# Patient Record
Sex: Female | Born: 2000 | Race: Black or African American | Hispanic: No | Marital: Single | Smoking: Never smoker
Health system: Southern US, Community
[De-identification: ages and names within clinical notes are randomized; demographics above are authoritative.]

## PROBLEM LIST (undated history)

## (undated) DIAGNOSIS — M329 Systemic lupus erythematosus, unspecified: Secondary | ICD-10-CM

## (undated) DIAGNOSIS — R7303 Prediabetes: Secondary | ICD-10-CM

## (undated) DIAGNOSIS — D649 Anemia, unspecified: Secondary | ICD-10-CM

## (undated) DIAGNOSIS — IMO0002 Reserved for concepts with insufficient information to code with codable children: Secondary | ICD-10-CM

## (undated) HISTORY — DX: Prediabetes: R73.03

## (undated) HISTORY — DX: Anemia, unspecified: D64.9

---
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
## 2018-01-17 ENCOUNTER — Encounter (HOSPITAL_COMMUNITY): Payer: Self-pay | Admitting: Emergency Medicine

## 2018-01-17 ENCOUNTER — Other Ambulatory Visit: Payer: Self-pay

## 2018-01-17 DIAGNOSIS — Z7982 Long term (current) use of aspirin: Secondary | ICD-10-CM | POA: Insufficient documentation

## 2018-01-17 DIAGNOSIS — L989 Disorder of the skin and subcutaneous tissue, unspecified: Secondary | ICD-10-CM | POA: Diagnosis not present

## 2018-01-17 DIAGNOSIS — Z79899 Other long term (current) drug therapy: Secondary | ICD-10-CM | POA: Diagnosis not present

## 2018-01-17 DIAGNOSIS — R102 Pelvic and perineal pain: Secondary | ICD-10-CM | POA: Insufficient documentation

## 2018-01-17 DIAGNOSIS — L930 Discoid lupus erythematosus: Secondary | ICD-10-CM | POA: Diagnosis not present

## 2018-01-17 DIAGNOSIS — R600 Localized edema: Secondary | ICD-10-CM | POA: Diagnosis present

## 2018-01-17 DIAGNOSIS — R238 Other skin changes: Secondary | ICD-10-CM | POA: Diagnosis not present

## 2018-01-17 DIAGNOSIS — M3210 Systemic lupus erythematosus, organ or system involvement unspecified: Secondary | ICD-10-CM | POA: Diagnosis not present

## 2018-01-17 NOTE — ED Triage Notes (Signed)
Pt arriving with swelling in her hands and vagial burning/itching. Swelling is associated with Lupus. Vaginal issues have been present x1 week

## 2018-01-18 ENCOUNTER — Emergency Department (HOSPITAL_COMMUNITY)
Admission: EM | Admit: 2018-01-18 | Discharge: 2018-01-18 | Disposition: A | Discharge status: Home / Self Care | Payer: Medicaid Other | Attending: Emergency Medicine | Admitting: Emergency Medicine

## 2018-01-18 DIAGNOSIS — R238 Other skin changes: Secondary | ICD-10-CM

## 2018-01-18 DIAGNOSIS — M329 Systemic lupus erythematosus, unspecified: Secondary | ICD-10-CM

## 2018-01-18 LAB — WET PREP, GENITAL
CLUE CELLS WET PREP: NONE SEEN
Sperm: NONE SEEN
TRICH WET PREP: NONE SEEN
YEAST WET PREP: NONE SEEN

## 2018-01-18 MED ORDER — MYCOPHENOLATE MOFETIL 500 MG PO TABS: 1000.0000 mg | ORAL_TABLET | Freq: Two times a day (BID) | ORAL | 0 refills | Status: DC

## 2018-01-18 MED ORDER — PREDNISONE 10 MG PO TABS: ORAL_TABLET | ORAL | 0 refills | Status: DC

## 2018-01-18 MED ORDER — PREDNISONE 20 MG PO TABS
60.0000 mg | ORAL_TABLET | Freq: Once | ORAL | Status: AC
  Administered 2018-01-18: 60 mg via ORAL
  Filled 2018-01-18: qty 3

## 2018-01-18 MED ORDER — HYDROXYCHLOROQUINE SULFATE 200 MG PO TABS: 200.0000 mg | ORAL_TABLET | Freq: Two times a day (BID) | ORAL | 0 refills | Status: AC

## 2018-01-18 NOTE — ED Provider Notes (Signed)
[place] DEPT Provider Note   CSN: [id number] Arrival date & time: 01/17/18  2231     History   Chief Complaint Chief Complaint  Patient presents with  . Joint Swelling    hands  . Vaginal Itching    HPI Rhonda Franco is a 17 y.o. female.  Patient with history of lupus, off medications x 2 months, presents with complaint of hand swelling for the past week. She states this is familiar to her as lupus related. She moved to the area recently and has not established with medicaid as of yet and has been out of her medications for 2 months, including cellcept, plaquenil, prednisone, lasix. No fever. No other joint or soft tissue swelling. No systemic symptoms of cough, chest pain, SOB, nausea, vomiting or abdominal pain. She does complain of burning sensation around the vaginal area. It is worse after she washes and eases over time afterward. She has not seen any rash. No vulvar or vaginal symptoms of discharge, bleeding or pelvic pain.   The history is provided by the patient. No language interpreter was used.  Vaginal Itching     Past Medical History:  Diagnosis Date  . Lupus [facility])     Patient Active Problem List   Diagnosis Date Noted  . Pericardial effusion 03/05/2017    History reviewed. No pertinent surgical history.   OB History   None      Home Medications    Prior to Admission medications   Medication Sig Start Date End Date Taking? Authorizing Provider  aspirin EC 81 MG tablet Take 81 mg by mouth daily.   Yes [provider]  colchicine 0.6 MG tablet Take 0.6 mg by mouth 2 (two) times daily. 05/11/17  Yes [provider]  folic acid (FOLVITE) 1 MG tablet Take 1 mg by mouth daily. 05/25/17  Yes [provider]  furosemide (LASIX) 20 MG tablet Take 20 mg by mouth 2 (two) times daily. 06/04/17  Yes [provider]  hydroxychloroquine (PLAQUENIL) 200 MG tablet Take 200 mg by mouth 2 (two)  times daily. 05/25/17  Yes [provider]  mycophenolate (CELLCEPT) 500 MG tablet Take 1,000 mg by mouth 2 (two) times daily. 12/01/17  Yes [provider]  predniSONE (DELTASONE) 5 MG tablet Take 5 mg by mouth every morning. 11/01/17  Yes [provider]  doxycycline (VIBRAMYCIN) 100 MG capsule Take 1 capsule (100 mg total) by mouth 2 (two) times daily. Patient not taking: Reported on 01/18/2018 08/28/17   Lajean Saver, MD    Family History Family History  Problem Relation Age of Onset  . Diabetes Mother   . Arthritis Mother   . Diabetes Maternal Aunt   . Diabetes Maternal Grandfather   . Heart disease Maternal Grandfather   . Stroke Paternal Grandmother     Social History Social History   Tobacco Use  . Smoking status: Never Smoker  . Smokeless tobacco: Never Used  Substance Use Topics  . Alcohol use: No    Frequency: Never  . Drug use: No     Allergies   Ampicillin; Other; Penicillins; and Bactrim [sulfamethoxazole-trimethoprim]   Review of Systems Review of Systems  Constitutional: Negative for chills and fever.  HENT: Negative.   Respiratory: Negative.   Cardiovascular: Negative.   Gastrointestinal: Negative.   Genitourinary: Positive for vaginal pain (See HPi.). Negative for dysuria.  Musculoskeletal:       See HPI.  Skin: Negative.  Negative for color change.  Neurological: Negative.  Negative for weakness and light-headedness.     Physical Exam Updated Vital Signs BP 140/75 (BP Location: Right Arm)   Pulse 100   Temp 99.2 F (37.3 C) (Oral)   Resp 18   Ht 5' 4" (1.626 m)   Wt 133.8 kg (295 lb)   LMP 12/27/2017   SpO2 100%   BMI 50.64 kg/m   Physical Exam  Constitutional: She is oriented to person, place, and time. She appears well-developed and well-nourished.  HENT:  Head: Normocephalic.  Neck: Normal range of motion. Neck supple.  Cardiovascular: Normal rate.  Pulmonary/Chest: Effort normal.  Abdominal: Soft.  Bowel sounds are normal. There is no tenderness. There is no rebound and no guarding.  Genitourinary:  Genitourinary Comments: Groin and external vaginal area are unremarkable in appearance. No redness, rash, swelling, pustules or blisters. Nontender. Vulva has no swelling or redness. There is a consistent, white discharge in the vaginal vault.No cervical discharge. No CMT.  Musculoskeletal: Normal range of motion.  Bilateral hand swelling without redness or tenderness. FROM.   Neurological: She is alert and oriented to person, place, and time.  Skin: Skin is warm and dry. No rash noted.  Psychiatric: She has a normal mood and affect.     ED Treatments / Results  Labs (all labs ordered are listed, but only abnormal results are displayed) Labs Reviewed  WET PREP, GENITAL    EKG None  Radiology No results found.  Procedures Procedures (including critical care time)  Medications Ordered in ED Medications  predniSONE (DELTASONE) tablet 60 mg (60 mg Oral Given 01/18/18 0331)     Initial Impression / Assessment and Plan / ED Course  I have reviewed the triage vital signs and the nursing notes.  Pertinent labs & imaging results that were available during my care of the patient were reviewed by me and considered in my medical decision making (see chart for details).     Patient with a history of lupus presents with swelling to hands bilaterally x 1 week. Off lupus medications for the past 2 months due to a move to the area. She states these symptoms are typical of her lupus.   Also has burning type pain the groin and external vaginal area after washing. No rash, evidence of infection, obvious yeast or other abnormality. The area is nontender. Discussed changing soaps to see if this improves symptoms.   Patient started on prednisone in the ED at 60 mg. Maintenance dose is 5 mg daily. Will provide 10-day Rx for Plaquenil, Cellcept and prednisone in a taper dose. Strongly encouraged  her to get established with medical provider for ongoing care. Will provide referral to [facility].   Final Clinical Impressions(s) / ED Diagnoses   Final diagnoses:  None   1. Lupus flare 2. Skin irritation  ED Discharge Orders    None       Charlann Lange, Hershal Coria 68/03/21 2248    Delora Fuel, MD 25/00/37 [phone]

## 2018-02-17 DIAGNOSIS — N760 Acute vaginitis: Secondary | ICD-10-CM | POA: Diagnosis not present

## 2018-03-06 IMAGING — CT CT ANGIO CHEST
2 of 7 series · 18 of 46 positions shown · IV contrast (isovue)
Comparison: Chest CT 03/05/2017.

CLINICAL DATA: Chest pain for 5 days. Reported pericarditis on
echocardiography 5 days ago.

EXAM:
CT ANGIOGRAPHY CHEST WITH CONTRAST
TECHNIQUE: Multidetector CT imaging of the chest was performed using the
standard protocol during bolus administration of intravenous
contrast. Multiplanar CT image reconstructions and MIPs were
obtained to evaluate the vascular anatomy.
CONTRAST:  100 ml Isovue 370.

[Series 9: thins · axial · 0.79mm/px · z∈[+1161,+1378]mm · 15 of 351 slices shown]
[im 20/351  lung]
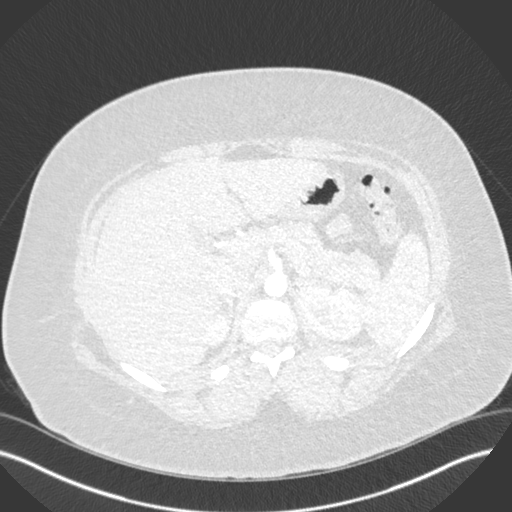
[im 39/351  soft-tissue]
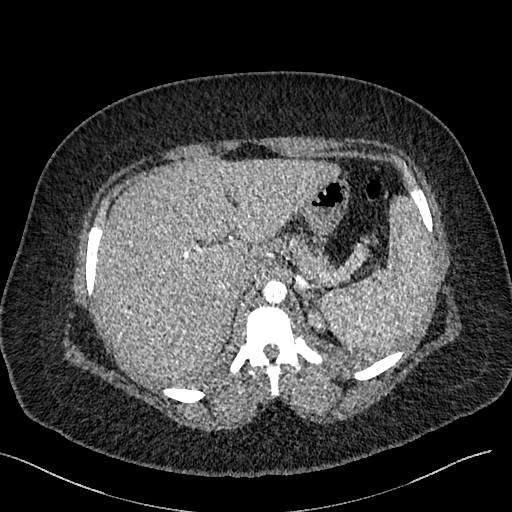
[im 59/351  lung]
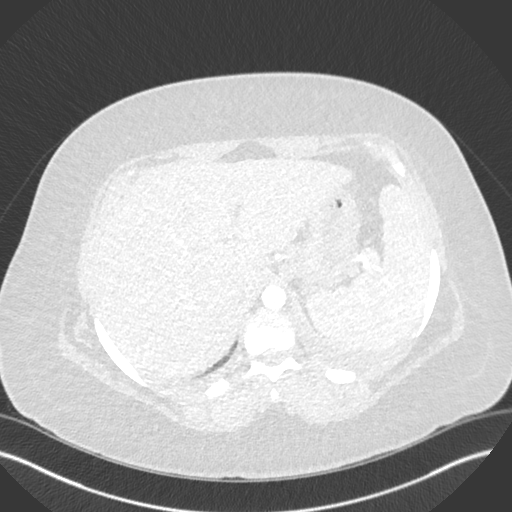
[im 78/351  soft-tissue]
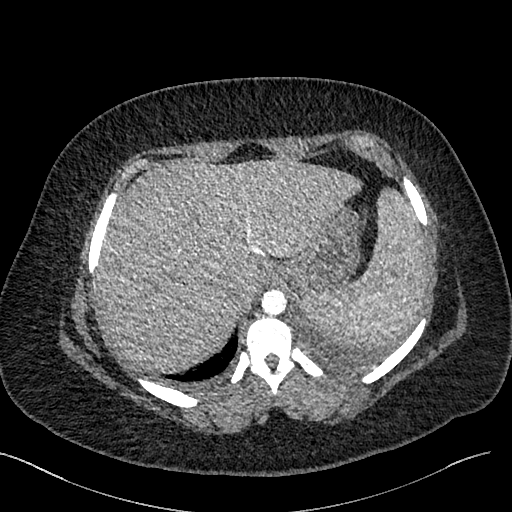
[im 117/351  lung]
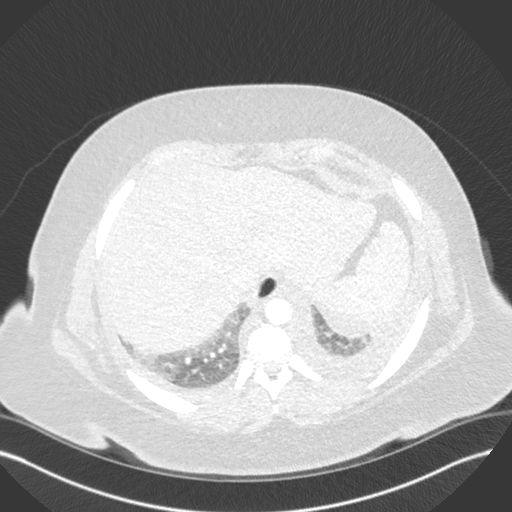
[im 137/351  soft-tissue]
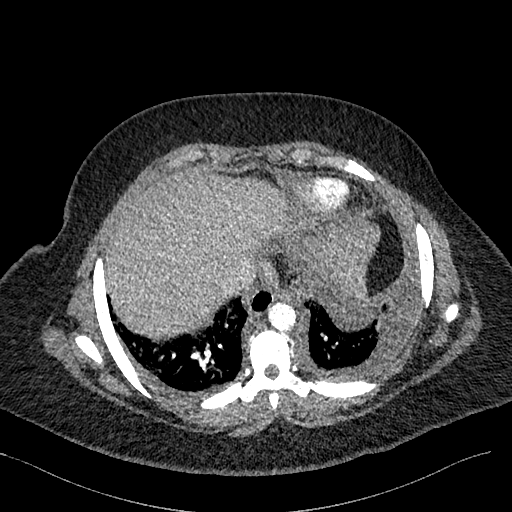
[im 156/351  lung]
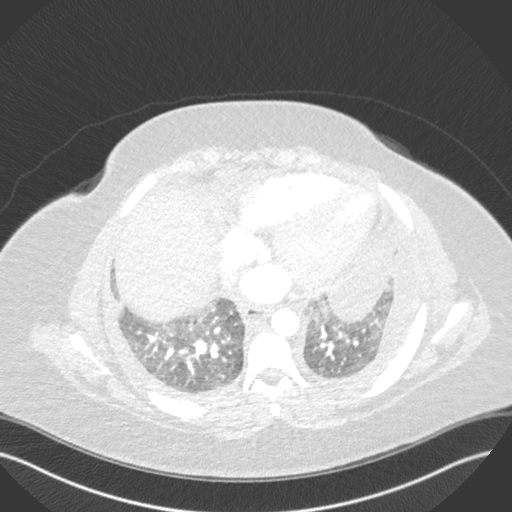
[im 176/351  soft-tissue]
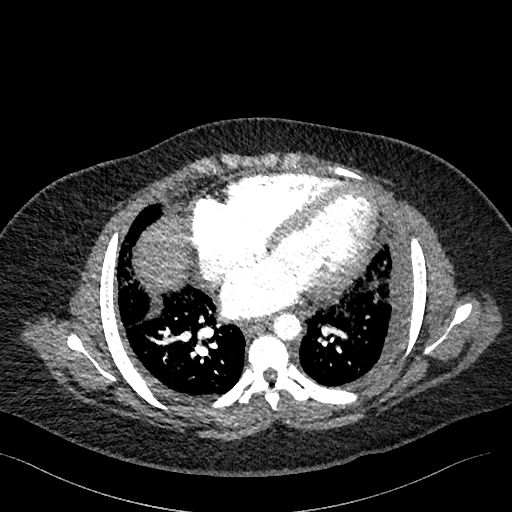
[im 195/351  lung]
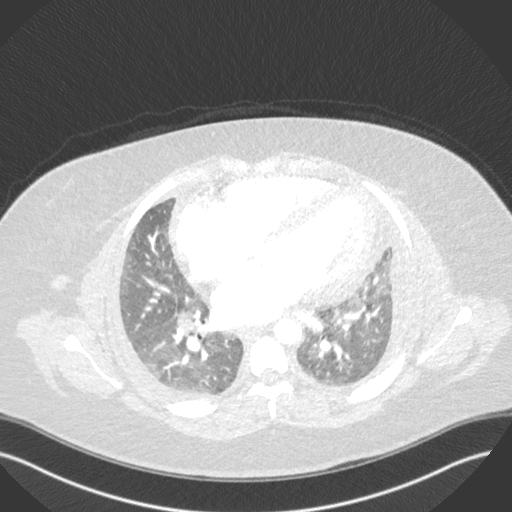
[im 214/351  soft-tissue]
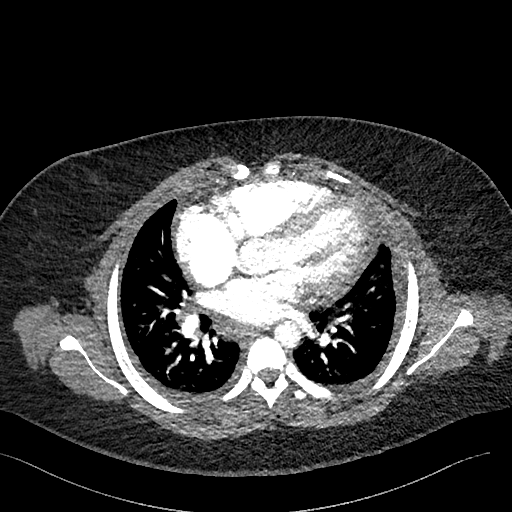
[im 234/351  lung]
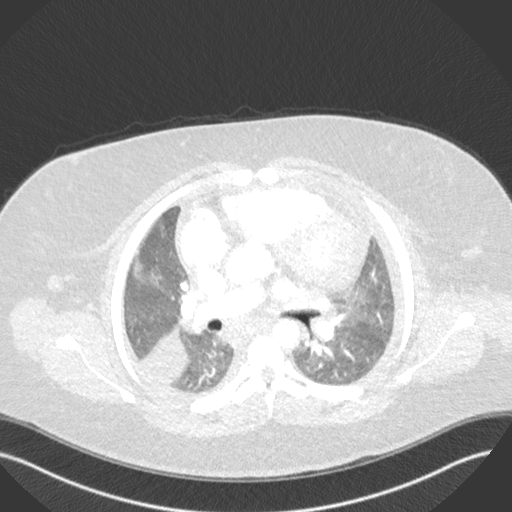
[im 273/351  soft-tissue]
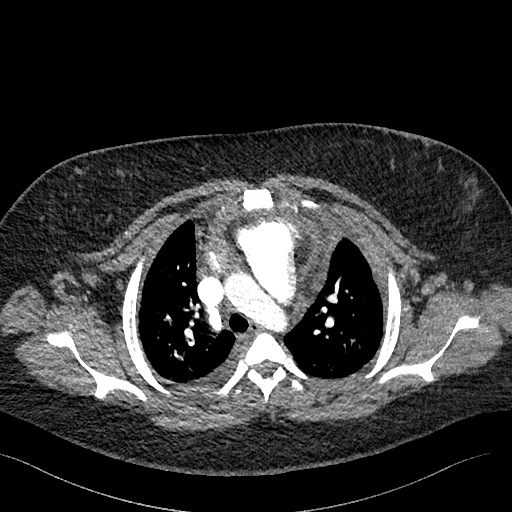
[im 292/351  lung]
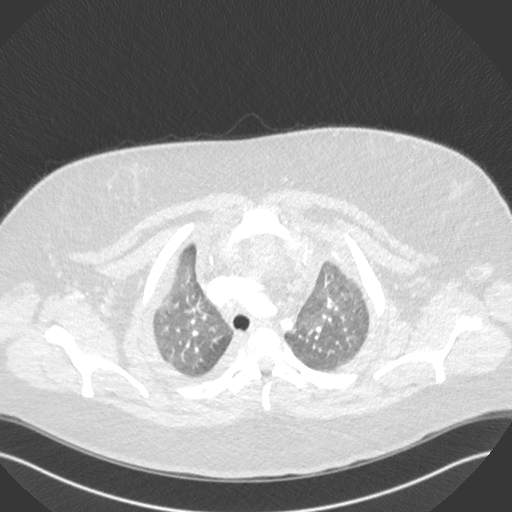
[im 312/351  soft-tissue]
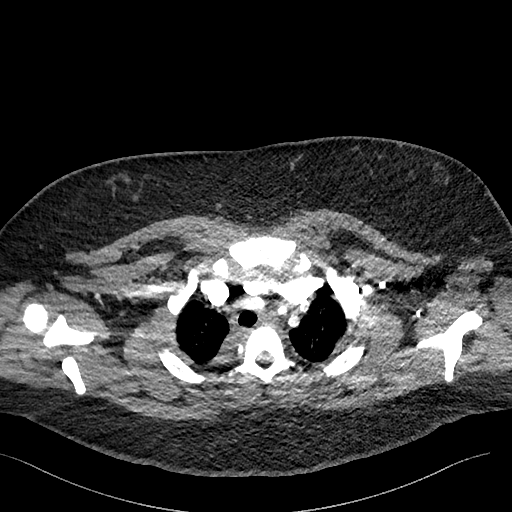
[im 331/351  lung]
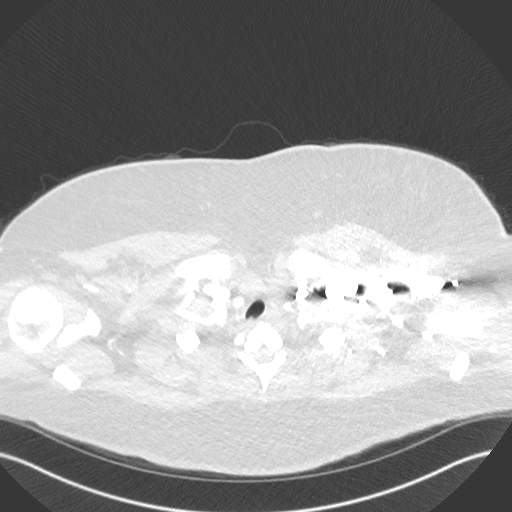

[Series 10: cor · coronal · 0.49mm/px · 3 of 162 slices shown]
[im 41/162  soft-tissue]
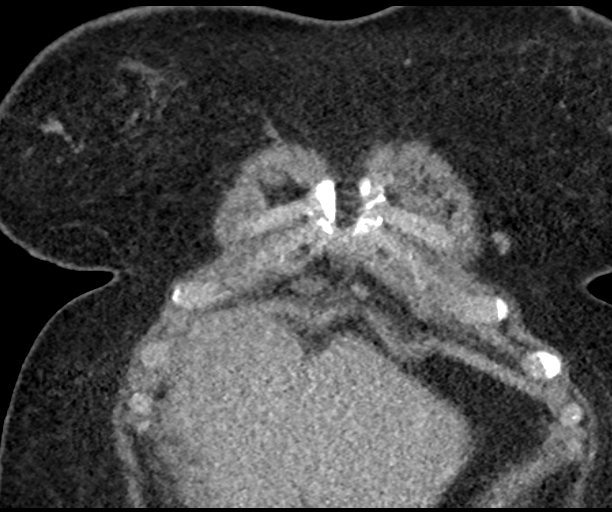
[im 81/162  soft-tissue]
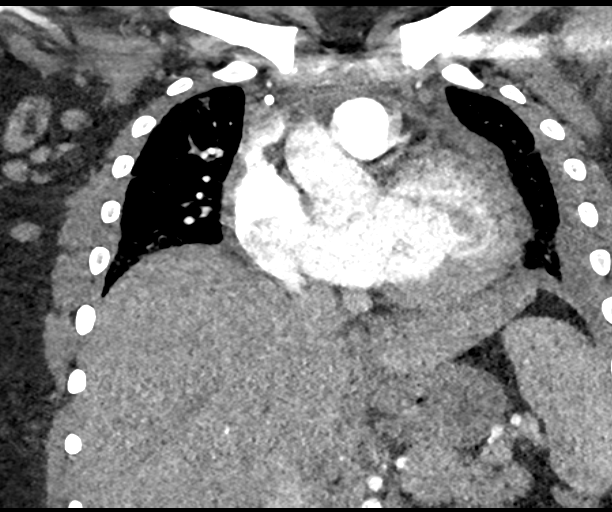
[im 121/162  soft-tissue]
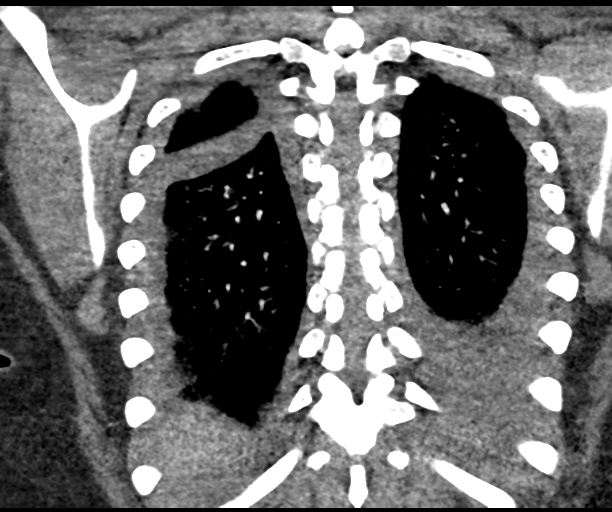

[18 of 46 positions shown; findings below may reference images not displayed]

FINDINGS: Cardiovascular: The pulmonary arteries are well opacified with
contrast to the level of the subsegmental branches. There is no
evidence of acute pulmonary embolism. No systemic arterial
abnormalities are identified. The heart is enlarged. Pericardial
effusion has significantly improved compared with the prior study.

Mediastinum/Nodes: Mildly prominent subcarinal and hilar lymph nodes
are again noted bilaterally, likely reactive. There also mildly
prominent axillary lymph nodes bilaterally. The thyroid gland,
trachea and esophagus demonstrate no significant findings.

Lungs/Pleura: There are small bilateral pleural effusions. Patchy
ground-glass opacities are present in both lungs with interval
worsening peripherally in the right upper lobe. The overall aeration
of the left lower lobe has improved. There is no consolidation.

Upper abdomen: The visualized upper abdomen appears stable without
suspicious findings.

Musculoskeletal/Chest wall: There is no chest wall mass or
suspicious osseous finding.

Review of the MIP images confirms the above findings.
IMPRESSION: 1. No evidence of acute pulmonary embolus.
2. Stable cardiomegaly with improvement in previously demonstrated
pericardial effusion. Minimal pericardial fluid remains.
3. Patchy ground-glass opacities in both lungs, probably edema.
There is an asymmetric component peripherally in the right upper
lobe.
4. Stable mildly prominent mediastinal, hilar and axillary lymph
nodes, likely reactive.

## 2018-04-22 ENCOUNTER — Encounter (HOSPITAL_COMMUNITY): Payer: Self-pay | Admitting: *Deleted

## 2018-04-22 ENCOUNTER — Other Ambulatory Visit: Payer: Self-pay

## 2018-04-22 ENCOUNTER — Emergency Department (HOSPITAL_COMMUNITY): Payer: Medicaid Other

## 2018-04-22 DIAGNOSIS — R079 Chest pain, unspecified: Secondary | ICD-10-CM | POA: Diagnosis not present

## 2018-04-22 DIAGNOSIS — J9811 Atelectasis: Secondary | ICD-10-CM | POA: Diagnosis not present

## 2018-04-22 DIAGNOSIS — Z7982 Long term (current) use of aspirin: Secondary | ICD-10-CM | POA: Diagnosis not present

## 2018-04-22 DIAGNOSIS — Z79899 Other long term (current) drug therapy: Secondary | ICD-10-CM | POA: Diagnosis not present

## 2018-04-22 DIAGNOSIS — J189 Pneumonia, unspecified organism: Secondary | ICD-10-CM | POA: Diagnosis not present

## 2018-04-22 LAB — CBC
HCT: 33.6 % — ABNORMAL LOW (ref 36.0–46.0)
HEMOGLOBIN: 10 g/dL — AB (ref 12.0–15.0)
MCH: 23.1 pg — ABNORMAL LOW (ref 26.0–34.0)
MCHC: 29.8 g/dL — AB (ref 30.0–36.0)
MCV: 77.8 fL — ABNORMAL LOW (ref 80.0–100.0)
PLATELETS: 368 10*3/uL (ref 150–400)
RBC: 4.32 MIL/uL (ref 3.87–5.11)
RDW: 16.9 % — ABNORMAL HIGH (ref 11.5–15.5)
WBC: 8.1 10*3/uL (ref 4.0–10.5)
nRBC: 0 % (ref 0.0–0.2)

## 2018-04-22 LAB — BASIC METABOLIC PANEL
ANION GAP: 6 (ref 5–15)
BUN: 5 mg/dL — ABNORMAL LOW (ref 6–20)
CO2: 26 mmol/L (ref 22–32)
Calcium: 8.1 mg/dL — ABNORMAL LOW (ref 8.9–10.3)
Chloride: 107 mmol/L (ref 98–111)
Creatinine, Ser: 0.54 mg/dL (ref 0.44–1.00)
GFR calc Af Amer: >60 mL/min (ref >60)
Glucose, Bld: 87 mg/dL (ref 70–99)
POTASSIUM: 4.2 mmol/L (ref 3.5–5.1)
SODIUM: 139 mmol/L (ref 135–145)

## 2018-04-22 LAB — TROPONIN I

## 2018-04-22 NOTE — ED Triage Notes (Signed)
Pt c/o chest pain x 1 week.  Has hx of Lupus.  Pt denies n/v.

## 2018-04-23 ENCOUNTER — Emergency Department (HOSPITAL_COMMUNITY)
Admission: EM | Admit: 2018-04-23 | Discharge: 2018-04-23 | Disposition: A | Discharge status: Home / Self Care | Payer: Medicaid Other | Attending: Emergency Medicine | Admitting: Emergency Medicine

## 2018-04-23 ENCOUNTER — Encounter (HOSPITAL_COMMUNITY): Payer: Self-pay

## 2018-04-23 ENCOUNTER — Emergency Department (HOSPITAL_COMMUNITY): Payer: Medicaid Other

## 2018-04-23 DIAGNOSIS — R079 Chest pain, unspecified: Secondary | ICD-10-CM | POA: Diagnosis not present

## 2018-04-23 DIAGNOSIS — J189 Pneumonia, unspecified organism: Secondary | ICD-10-CM

## 2018-04-23 LAB — I-STAT BETA HCG BLOOD, ED (NOT ORDERABLE)

## 2018-04-23 LAB — D-DIMER, QUANTITATIVE (NOT AT ARMC): D DIMER QUANT: 0.99 ug{FEU}/mL — AB (ref 0.00–0.50)

## 2018-04-23 MED ORDER — KETOROLAC TROMETHAMINE 30 MG/ML IJ SOLN
30.0000 mg | Freq: Once | INTRAMUSCULAR | Status: AC
  Administered 2018-04-23: 30 mg via INTRAVENOUS
  Filled 2018-04-23: qty 1

## 2018-04-23 MED ORDER — AZITHROMYCIN 250 MG PO TABS: 250.0000 mg | ORAL_TABLET | Freq: Every day | ORAL | 0 refills | Status: DC

## 2018-04-23 MED ORDER — IOPAMIDOL (ISOVUE-370) INJECTION 76%
INTRAVENOUS | Status: AC
  Administered 2018-04-23: 100 mL via INTRAVENOUS
  Filled 2018-04-23: qty 100

## 2018-04-23 MED ORDER — SODIUM CHLORIDE 0.9 % IJ SOLN
INTRAMUSCULAR | Status: AC
  Filled 2018-04-23: qty 50

## 2018-04-23 MED ORDER — AZITHROMYCIN 250 MG PO TABS
500.0000 mg | ORAL_TABLET | Freq: Once | ORAL | Status: AC
  Administered 2018-04-23: 500 mg via ORAL
  Filled 2018-04-23: qty 2

## 2018-04-23 MED ORDER — IOPAMIDOL (ISOVUE-370) INJECTION 76%
100.0000 mL | Freq: Once | INTRAVENOUS | Status: AC | PRN
  Administered 2018-04-23: 100 mL via INTRAVENOUS

## 2018-04-23 NOTE — ED Provider Notes (Signed)
TIME SEEN: 12:11 AM  CHIEF COMPLAINT: Chest pain  HPI: Patient is an 17 year old female with history of lupus, pericardial effusion who presents to the emergency department with 1 week of chest pain that she describes as a "pulling" with shortness of breath.  Has been off of her Plaquenil, furosemide, colchicine for the past 3 to 4 months.  States she just moved here and just got Medicaid.  Has an appointment to see  and wellness on October 28.  No history of PE or DVT.  No lower extremity swelling or pain.  No fever or cough.  ROS: See HPI Constitutional: no fever  Eyes: no drainage  ENT: no runny nose   Cardiovascular:   chest pain  Resp:  SOB  GI: no vomiting GU: no dysuria Integumentary: no rash  Allergy: no hives  Musculoskeletal: no leg swelling  Neurological: no slurred speech ROS otherwise negative  PAST MEDICAL HISTORY/PAST SURGICAL HISTORY:  Past Medical History:  Diagnosis Date  . Lupus ([place])     MEDICATIONS:  Prior to Admission medications   Medication Sig Start Date End Date Taking? Authorizing Provider  aspirin EC 81 MG tablet Take 81 mg by mouth daily.    [provider]  colchicine 0.6 MG tablet Take 0.6 mg by mouth 2 (two) times daily. 05/11/17   [provider]  doxycycline (VIBRAMYCIN) 100 MG capsule Take 1 capsule (100 mg total) by mouth 2 (two) times daily. Patient not taking: Reported on 01/18/2018 08/28/17   Lajean Saver, MD  folic acid (FOLVITE) 1 MG tablet Take 1 mg by mouth daily. 05/25/17   [provider]  furosemide (LASIX) 20 MG tablet Take 20 mg by mouth 2 (two) times daily. 06/04/17   [provider]  hydroxychloroquine (PLAQUENIL) 200 MG tablet Take 1 tablet (200 mg total) by mouth 2 (two) times daily. 01/18/18   Charlann Lange, PA-C  mycophenolate (CELLCEPT) 500 MG tablet Take 2 tablets (1,000 mg total) by mouth 2 (two) times daily. 01/18/18   Charlann Lange, PA-C  predniSONE (DELTASONE) 10 MG tablet  Take 5 on days 2 and 3 (day one provided in ED) Take 4 on days 4 and 5 Take 3 on days 6 and 7 Take 2 on days 8 and 9 Take 1 on days 10 and 11 Take 1/2 daily after that 01/18/18   Charlann Lange, PA-C    ALLERGIES:  Allergies  Allergen Reactions  . Ampicillin Anaphylaxis, Swelling, Palpitations and Rash  . Other Shortness Of Breath  . Penicillins Anaphylaxis, Swelling, Palpitations and Rash    Has patient had a PCN reaction causing immediate rash, facial/tongue/throat swelling, SOB or lightheadedness with hypotension: Yes Has patient had a PCN reaction causing severe rash involving mucus membranes or skin necrosis: Yes Has patient had a PCN reaction that required hospitalization: Yes Has patient had a PCN reaction occurring within the last 10 years: Yes If all of the above answers are "NO", then may proceed with Cephalosporin use.   . Bactrim [Sulfamethoxazole-Trimethoprim] Rash    SOCIAL HISTORY:  Social History   Tobacco Use  . Smoking status: Never Smoker  . Smokeless tobacco: Never Used  Substance Use Topics  . Alcohol use: No    Frequency: Never    FAMILY HISTORY: Family History  Problem Relation Age of Onset  . Diabetes Mother   . Arthritis Mother   . Diabetes Maternal Aunt   . Diabetes Maternal Grandfather   . Heart disease Maternal Grandfather   .  Stroke Paternal Grandmother     EXAM: BP 130/90 (BP Location: Left Arm)   Pulse (!) 102   Temp 99.1 F (37.3 C)   Resp 16   Ht _0  (1.651 m)   Wt 129.7 kg   LMP 04/19/2018 (Approximate)   SpO2 100%   BMI 47.59 kg/m  CONSTITUTIONAL: Alert and oriented and responds appropriately to questions. Well-appearing; well-nourished obese HEAD: Normocephalic EYES: Conjunctivae clear, pupils appear equal, EOMI ENT: normal nose; moist mucous membranes NECK: Supple, no meningismus, no nuchal rigidity, no LAD  CARD: Regular and tachycardic; S1 and S2 appreciated; no murmurs, no clicks, no rubs, no gallops RESP: Normal  chest excursion without splinting or tachypnea; breath sounds clear and equal bilaterally; no wheezes, no rhonchi, no rales, no hypoxia or respiratory distress, speaking full sentences ABD/GI: Normal bowel sounds; non-distended; soft, non-tender, no rebound, no guarding, no peritoneal signs, no hepatosplenomegaly BACK:  The back appears normal and is non-tender to palpation, there is no CVA tenderness EXT: Normal ROM in all joints; non-tender to palpation; no edema; normal capillary refill; no cyanosis, no calf tenderness or swelling    SKIN: Normal color for age and race; warm; no rash NEURO: Moves all extremities equally PSYCH: The patient's mood and manner are appropriate. Grooming and personal hygiene are appropriate.  MEDICAL DECISION MAKING: Patient here with chest pain.  Concern for possible pericardial effusion versus PE.  She is mildly tachycardic here.  Clinically I am not concerned for tampon on.  Troponin negative.  Chest x-ray clear.  EKG shows no ischemic abnormality.  Will obtain d-dimer.  Will give Toradol for symptom management.  ED PROGRESS: Patient's d-dimer is positive.  Will obtain a CTA of her chest for further evaluation.  CT scan shows no pulmonary embolus or pericardial effusion.  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  Will treat with azithromycin.  She has outpatient follow-up on the 28th.  She is hemodynamically stable with no respiratory distress or hypoxia.  She is comfortable with this plan.  Recommended alternating Tylenol and Motrin for pain.   EKG Interpretation  Date/Time:  Tuesday April 22 2018 22:34:16 EDT Ventricular Rate:  109 PR Interval:    QRS Duration: 82 QT Interval:  327 QTC Calculation: 441 R Axis:   89 Text Interpretation:  Sinus tachycardia Baseline wander in lead(s) I T wave inversions in inferior leads have improved No  significant change since last tracing Confirmed by Pryor Curia [phone]) on 04/22/2018 11:32:56 PM         Brendon Christoffel, Delice Bison, DO 04/23/18 0405

## 2018-04-23 NOTE — ED Notes (Signed)
Tried to get iv x 2.

## 2018-04-23 NOTE — Discharge Instructions (Signed)
You may alternate Tylenol 1000 mg every 6 hours as needed for pain and Ibuprofen 800 mg every 8 hours as needed for pain.  Please take Ibuprofen with food.

## 2018-05-05 ENCOUNTER — Ambulatory Visit (HOSPITAL_BASED_OUTPATIENT_CLINIC_OR_DEPARTMENT_OTHER): Payer: Medicaid Other | Admitting: Family Medicine

## 2018-05-05 ENCOUNTER — Ambulatory Visit (HOSPITAL_COMMUNITY)
Admission: RE | Admit: 2018-05-05 | Discharge: 2018-05-05 | Disposition: A | Discharge status: Home / Self Care | Payer: Medicaid Other | Source: Ambulatory Visit | Attending: Family Medicine | Admitting: Family Medicine

## 2018-05-05 ENCOUNTER — Encounter: Payer: Self-pay | Admitting: Family Medicine

## 2018-05-05 VITALS — BP 115/83 | HR 107 | Temp 100.0°F | Resp 16 | Ht 65.0 in | Wt 303.4 lb

## 2018-05-05 DIAGNOSIS — L299 Pruritus, unspecified: Secondary | ICD-10-CM

## 2018-05-05 DIAGNOSIS — Z8249 Family history of ischemic heart disease and other diseases of the circulatory system: Secondary | ICD-10-CM

## 2018-05-05 DIAGNOSIS — Z6841 Body Mass Index (BMI) 40.0 and over, adult: Secondary | ICD-10-CM | POA: Insufficient documentation

## 2018-05-05 DIAGNOSIS — G479 Sleep disorder, unspecified: Secondary | ICD-10-CM

## 2018-05-05 DIAGNOSIS — J189 Pneumonia, unspecified organism: Secondary | ICD-10-CM

## 2018-05-05 DIAGNOSIS — M329 Systemic lupus erythematosus, unspecified: Secondary | ICD-10-CM

## 2018-05-05 DIAGNOSIS — Z7952 Long term (current) use of systemic steroids: Secondary | ICD-10-CM | POA: Insufficient documentation

## 2018-05-05 DIAGNOSIS — Z79899 Other long term (current) drug therapy: Secondary | ICD-10-CM | POA: Diagnosis not present

## 2018-05-05 DIAGNOSIS — R82998 Other abnormal findings in urine: Secondary | ICD-10-CM

## 2018-05-05 DIAGNOSIS — Z23 Encounter for immunization: Secondary | ICD-10-CM

## 2018-05-05 DIAGNOSIS — IMO0002 Reserved for concepts with insufficient information to code with codable children: Secondary | ICD-10-CM

## 2018-05-05 DIAGNOSIS — R0602 Shortness of breath: Secondary | ICD-10-CM

## 2018-05-05 DIAGNOSIS — N898 Other specified noninflammatory disorders of vagina: Secondary | ICD-10-CM

## 2018-05-05 DIAGNOSIS — Z833 Family history of diabetes mellitus: Secondary | ICD-10-CM

## 2018-05-05 LAB — POCT URINALYSIS DIP (CLINITEK)
Blood, UA: NEGATIVE
Glucose, UA: NEGATIVE mg/dL
Nitrite, UA: NEGATIVE
Spec Grav, UA: >=1.03 — AB
Urobilinogen, UA: 1 U/dL
pH, UA: 6

## 2018-05-05 MED ORDER — PREDNISONE 10 MG PO TABS: 5.0000 mg | ORAL_TABLET | Freq: Every day | ORAL | 2 refills | Status: DC

## 2018-05-05 MED ORDER — AZITHROMYCIN 250 MG PO TABS: 250.0000 mg | ORAL_TABLET | Freq: Every day | ORAL | 0 refills | Status: DC

## 2018-05-05 NOTE — Progress Notes (Signed)
Patient stated she have not lately on any medication for her Lupus.   Pt. Stated she also deal with body aches and would like to be check out for fibromyalgia.

## 2018-05-05 NOTE — Progress Notes (Signed)
Subjective:    Patient ID: Rhonda Franco, female    DOB: Dec 31, 2000, 17 y.o.   MRN: [id number]  HPI       17 year old female new to the practice.  Patient is accompanied by her mother at today's visit.  Patient states that she was diagnosed with lupus at the age of 32.  Patient reports that she was on medication for her lupus but after moving from [place], she was off of her medications for a while until she was recently restarted on Plaquenil.  Patient also states that she takes prednisone daily.  Patient however was recently seen in the emergency department and diagnosed with pneumonia and patient states that she was on an increased dose of prednisone at that time which is now being tapered down.  Patient reports that she still feels as if she has some chest congestion, shortness of breath and cough. (Per medical records, patient was seen on 04/23/2018 in the emergency department secondary to 1 week of chest pain and shortness of breath.  Patient reported that she had been out of her medications for lupus for a few months.  Patient had normal lung sounds and normal chest x-ray but because of her pain and shortness of breath, d-dimer was also obtained which was positive and patient had CTA of her chest to look for possible PE.  Patient also with past history of pericardial effusion.  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  Patient was treated with an azithromycin Z-Pak).  Patient states that she does feel slightly better than when she was seen in the emergency department.  Patient denies any recent fever or chills.  Patient does have some fatigue.  Patient denies any current chest pain.      Patient also with complaint of vaginal itching at today's visit.  Patient states that she had similar symptoms this summer in July and was seen at the emergency department  and was diagnosed with bacterial vaginitis.  Patient states that this was treated and her symptoms resolved but itching sensation has returned within the past 1 to 2 weeks.  Patient denies any possibility of pregnancy.  Patient is sexually active however.  Patient denies pelvic pain patient does have some discomfort with urination but denies frequency.  Patient states that she was supposed to receive Depo-Provera but missed her appointment.  Patient would be interested in GYN referral.  Patient would also like referral to rheumatology and further follow-up of her lupus.  Patient states that she has joint pain associated with her lupus especially issues with swelling/pain in her hands and knees.  Patient also has some skin lesions associated with her lupus.  Patient is not sure if she has renal or cardiac issues currently but did have prior pleural effusion.  Patient would also like a refill of the prednisone that she takes on a daily basis.      Patient reports that she is currently attending college.  Patient denies any tobacco or alcohol use.  Patient reports that she is allergic to ampicillin, penicillin and Bactrim which caused her to have a rash.  Patient and mother report family history of mother with diabetes, seizure disorder and arthritis.  Patient would like to receive her influenza immunization at today's visit.   Past Medical History:  Diagnosis Date  . Lupus ([place])   History reviewed. No pertinent surgical history.  Family History  Problem  Relation Age of Onset  . Diabetes Mother   . Arthritis Mother   . Diabetes Maternal Aunt   . Diabetes Maternal Grandfather   . Heart disease Maternal Grandfather   . Stroke Paternal Grandmother    Social History   Tobacco Use  . Smoking status: Never Smoker  . Smokeless tobacco: Never Used  Substance Use Topics  . Alcohol use: No    Frequency: Never  . Drug use: No   Allergies  Allergen Reactions  . Ampicillin Anaphylaxis, Swelling,  Palpitations and Rash  . Other Shortness Of Breath  . Penicillins Anaphylaxis, Swelling, Palpitations and Rash    Has patient had a PCN reaction causing immediate rash, facial/tongue/throat swelling, SOB or lightheadedness with hypotension: Yes Has patient had a PCN reaction causing severe rash involving mucus membranes or skin necrosis: Yes Has patient had a PCN reaction that required hospitalization: Yes Has patient had a PCN reaction occurring within the last 10 years: Yes If all of the above answers are "NO", then may proceed with Cephalosporin use.   . Bactrim [Sulfamethoxazole-Trimethoprim] Rash     Review of Systems  Constitutional: Positive for fatigue. Negative for chills, diaphoresis and fever.  HENT: Positive for congestion. Negative for ear pain, sinus pressure, sinus pain, sore throat and trouble swallowing.        Review of systems is positive for snoring  Eyes: Negative for photophobia and visual disturbance.  Respiratory: Positive for cough and shortness of breath. Negative for wheezing.   Cardiovascular: Negative for chest pain, palpitations and leg swelling.  Gastrointestinal: Negative for abdominal pain and nausea.  Endocrine: Negative for polydipsia, polyphagia and polyuria.  Genitourinary: Positive for vaginal discharge. Negative for dysuria, flank pain, frequency and vaginal pain.  Musculoskeletal: Positive for arthralgias, joint swelling and myalgias. Negative for back pain and gait problem.  Neurological: Negative for dizziness, light-headedness, numbness and headaches.  Hematological: Negative for adenopathy. Does not bruise/bleed easily.       Objective:   Physical Exam BP 115/83 (BP Location: Left Arm, Patient Position: Sitting, Cuff Size: Large)   Pulse (!) 107   Temp 100 F (37.8 C) (Oral)   Resp 16   Ht _0  (1.651 m)   Wt (!) 303 lb 6.4 oz (137.6 kg)   LMP 04/19/2018 (Approximate) Comment: negative beta HCG 04/23/18  SpO2 98%   BMI 50.49 kg/m  Nurse's notes and vital signs reviewed General-well-nourished, well-developed obese young adult female in no acute distress.  Patient is accompanied by her mother at today's visit. ENT- TMs dull bilaterally, nares with moderate edema of the nasal turbinates, patient with a narrowed posterior pharynx with mild erythema, and large tongue base Neck-patient with large neck size, no lymphadenopathy, borderline thyromegaly Lungs- clear to auscultation bilaterally, no wheeze, no increased work of breathing, mild decrease at the lung bases Cardiovascular-regular rate and rhythm Abdomen-truncal obesity, soft and nontender Back-no CVA tenderness Extremities- patient with some mild, nonpitting distal lower extremity edema/puffiness proximal to the ankles and feet Skin- patient with some darkening of the skin around the neck with a change in skin texture/acanthosis nigricans      Assessment & Plan:  1. Lupus ([place]) Patient reports history of lupus with prior pericardial effusion.  Patient reports that she recently restarted the use of Plaquenil as well as prednisone.  Patient will be referred to ophthalmology in follow-up of her use of Plaquenil.  Patient will be referred to rheumatology in follow-up of her lupus.  Patient is provided with prescription  for prednisone 5 mg daily which appears to be her maintenance dose according to prior medical records.  Patient also will be referred to GYN regarding contraceptive use/family-planning.  Patient may have increased risk of coagulability secondary to her diagnosis of lupus.  Patient will have CBC, CMP, hemoglobin A1c and lipid panel in follow-up of her lupus, use of high-risk medication such as Plaquenil and prednisone and patient's obesity.  Sed rate and follow-up of lupus. - Ambulatory referral to Ophthalmology - Ambulatory referral to Rheumatology - Ambulatory referral to Gynecology - predniSONE (DELTASONE) 10 MG tablet; Take 0.5 tablets (5 mg total) by mouth  daily with breakfast.  Dispense: 15 tablet; Refill: 2 - Comprehensive metabolic panel - CBC with Differential - Hemoglobin A1c - Lipid Panel - TSH - Sedimentation Rate  2. Itching in the vaginal area Patient with complaint of itching in the vaginal area and patient states that this seems similar to symptoms she had this summer when she was treated for BV at the emergency department in July.  Patient however has recently been on an antibiotic and increased doses of prednisone and I suspect that she could also have a candidal infection.  Patient will have urinalysis to look for urinary tract infection as well as urine cytology study to look for any sexually transmitted diseases or BV or candidal infection.  Patient is also being referred to GYN for contraceptive planning as patient is sexually active but reports no use of birth control. - POCT URINALYSIS DIP (CLINITEK) - Urine cytology ancillary only - Ambulatory referral to Gynecology  3. Leukocytes in urine Patient's urinalysis revealed leukocytes in the urine.  Urine will be sent for culture as this could represent a urinary tract infection or be related to vaginal infection. - Urine Culture  4. Community acquired pneumonia, unspecified laterality Patient with a recent diagnosis of community-acquired pneumonia for which he was prescribed an azithromycin Z-Pak and prednisone taper at her emergency department visit on 04/23/2018.  Patient with complaint of continued shortness of breath and cough.  Patient does have 98% oxygen saturation on room air and no increased work of breathing on exam.  Patient will receive another prescription for azithromycin Z-Pak and patient is encouraged to take an over-the-counter medication such as Robitussin-DM to help with chest congestion.  Order placed for patient to have chest x-ray done in follow-up of pneumonia - azithromycin (ZITHROMAX) 250 MG tablet; Take 1 tablet (250 mg total) by mouth daily. Start on  04/24/18.  Dispense: 4 tablet; Refill: 0 - DG Chest 2 View; Future  5. Need for immunization against influenza Patient was offered and agreed to have influenza immunization at today's visit.  Patient was given an informational handout regarding the influenza immunization. - Flu Vaccine QUAD 36+ mos IM  6. Long-term use of high-risk medication Patient with lupus and high risk medication use in the form of Plaquenil and prednisone.  Patient also takes Lasix as needed for peripheral edema.  Patient will have UA, CMP, CBC, hemoglobin A1c, lipid panel, TSH and sed rate done in follow-up of her long-term use of high-risk medications but also in follow-up of patient's obesity. - POCT URINALYSIS DIP (CLINITEK) - Comprehensive metabolic panel - CBC with Differential - Hemoglobin A1c - Lipid Panel - TSH - Sedimentation Rate  7. Sleep disturbance Patient with complaint of snoring, fatigue, and on exam, patient with a narrowed posterior airway as well as obesity.  I discussed with the patient and her mother that I believe the patient likely  has sleep apnea.  Patient will be referred for a split-night sleep study.  Further treatment will be determined by the results of the sleep study. - Split night study; Future  8.  Morbid obesity- BMI 50 Low-fat, low carbohydrate diet encouraged along with exercise as tolerated on a regular basis with goal of weight loss.  Patient will have labs in follow-up of her obesity including CMP to look for elevated blood sugar or abnormal LFTs related to fatty liver.  Patient will have TSH to look for hyperthyroidism which may be contributing to weight gain.  Patient will have hemoglobin A1c to look for prediabetes or diabetes as patient also with chronic use of prednisone.  And patient will have lipid panel to look for hyperlipidemia related to her obesity.  Patient will be notified of the results and if further treatment is needed based on these results.  An After Visit  Summary was printed and given to the patient.  Return in about 4 weeks (around 06/02/2018), or if symptoms worsen or fail to improve, for medical issues.

## 2018-05-06 ENCOUNTER — Telehealth: Payer: Self-pay | Admitting: General Practice

## 2018-05-06 ENCOUNTER — Telehealth: Payer: Self-pay | Admitting: Family Medicine

## 2018-05-06 LAB — CBC WITH DIFFERENTIAL/PLATELET
Basophils Absolute: 0 x10E3/uL (ref 0.0–0.2)
Basos: 0 %
EOS (ABSOLUTE): 0.3 x10E3/uL (ref 0.0–0.4)
Eos: 3 %
Hematocrit: 32.3 % — ABNORMAL LOW (ref 34.0–46.6)
Hemoglobin: 10.1 g/dL — ABNORMAL LOW (ref 11.1–15.9)
Immature Grans (Abs): 0 x10E3/uL (ref 0.0–0.1)
Immature Granulocytes: 0 %
Lymphocytes Absolute: 1.1 x10E3/uL (ref 0.7–3.1)
Lymphs: 12 %
MCH: 23.6 pg — ABNORMAL LOW (ref 26.6–33.0)
MCHC: 31.3 g/dL — ABNORMAL LOW (ref 31.5–35.7)
MCV: 76 fL — ABNORMAL LOW (ref 79–97)
Monocytes Absolute: 0.8 x10E3/uL (ref 0.1–0.9)
Monocytes: 9 %
Neutrophils Absolute: 6.8 x10E3/uL (ref 1.4–7.0)
Neutrophils: 76 %
Platelets: 366 x10E3/uL (ref 150–450)
RBC: 4.28 x10E6/uL (ref 3.77–5.28)
RDW: 16.1 % — ABNORMAL HIGH (ref 12.3–15.4)
WBC: 9.1 x10E3/uL (ref 3.4–10.8)

## 2018-05-06 LAB — COMPREHENSIVE METABOLIC PANEL WITH GFR
ALT: 18 IU/L (ref 0–32)
AST: 40 IU/L (ref 0–40)
Albumin/Globulin Ratio: 0.7 — ABNORMAL LOW (ref 1.2–2.2)
Albumin: 3.5 g/dL (ref 3.5–5.5)
Alkaline Phosphatase: 53 IU/L (ref 43–101)
BUN/Creatinine Ratio: 10 (ref 9–23)
BUN: 6 mg/dL (ref 6–20)
Bilirubin Total: 0.4 mg/dL (ref 0.0–1.2)
CO2: 23 mmol/L (ref 20–29)
Calcium: 8.7 mg/dL (ref 8.7–10.2)
Chloride: 101 mmol/L (ref 96–106)
Creatinine, Ser: 0.59 mg/dL (ref 0.57–1.00)
GFR calc Af Amer: 155 mL/min/1.73
GFR calc non Af Amer: 134 mL/min/1.73
Globulin, Total: 4.9 g/dL — ABNORMAL HIGH (ref 1.5–4.5)
Glucose: 76 mg/dL (ref 65–99)
Potassium: 4.2 mmol/L (ref 3.5–5.2)
Sodium: 136 mmol/L (ref 134–144)
Total Protein: 8.4 g/dL (ref 6.0–8.5)

## 2018-05-06 LAB — LIPID PANEL
Chol/HDL Ratio: 4.8 ratio — ABNORMAL HIGH (ref 0.0–4.4)
Cholesterol, Total: 116 mg/dL (ref 100–169)
HDL: 24 mg/dL — ABNORMAL LOW
LDL Calculated: 69 mg/dL (ref 0–109)
Triglycerides: 115 mg/dL — ABNORMAL HIGH (ref 0–89)
VLDL Cholesterol Cal: 23 mg/dL (ref 5–40)

## 2018-05-06 LAB — HEMOGLOBIN A1C
Est. average glucose Bld gHb Est-mCnc: 120 mg/dL
Hgb A1c MFr Bld: 5.8 % — ABNORMAL HIGH (ref 4.8–5.6)

## 2018-05-06 LAB — TSH: TSH: 3.65 u[IU]/mL (ref 0.450–4.500)

## 2018-05-06 LAB — SEDIMENTATION RATE: Sed Rate: 60 mm/h — ABNORMAL HIGH (ref 0–32)

## 2018-05-06 NOTE — Telephone Encounter (Signed)
Patient wants xrays

## 2018-05-06 NOTE — Telephone Encounter (Signed)
Patient aware of appointment on 05/13/18 at 2:10pm.  Patient verbalized understanding.

## 2018-05-07 ENCOUNTER — Other Ambulatory Visit: Payer: Self-pay | Admitting: Family Medicine

## 2018-05-07 DIAGNOSIS — N3000 Acute cystitis without hematuria: Secondary | ICD-10-CM

## 2018-05-07 LAB — URINE CULTURE

## 2018-05-07 MED ORDER — CLINDAMYCIN HCL 300 MG PO CAPS: 300.0000 mg | ORAL_CAPSULE | Freq: Two times a day (BID) | ORAL | 0 refills | Status: AC

## 2018-05-07 NOTE — Telephone Encounter (Signed)
If patient is having continued symptoms, please asked patient to return to clinic for reexamination and repeat chest CT may need to be ordered if she is having continued respiratory symptoms.  If she is acutely having chest pain, shortness of breath or symptoms similar to her recent emergency department visit then she should return to the emergency department for further evaluation

## 2018-05-07 NOTE — Telephone Encounter (Signed)
Patient was called, verified dob, and was given most recent chest xray results. Patient wondered what was next with the peripheral and upper airspace disease, since it was not seen well on cxr. Please fu at your earliest convenience.

## 2018-05-07 NOTE — Progress Notes (Signed)
Patient ID: Rhonda Franco, female   DOB: 2001/02/11, 17 y.o.   MRN: [id number]   Patient with recent urine culture which grew out group B streptococcus.  Preferred treatments are penicillins/cephalosporins and ampicillin.  Patient has had an anaphylactic response to penicillin per report of patient and mother therefore this likely also eliminates the use of cephalosporins.  Patient is also allergic to Bactrim which could also be used.  Per the [facility] drug guide, clindamycin can also give coverage for group B streptococcus therefore this medication will be sent into patient's pharmacy and patient/mother will be notified.  I have also messaged the clinical pharmacist to see if there are other treatments available.  The other listed antibiotics that would offer treatment per the [facility] guide, appear to be antibiotics that would need to be administered by IV.  I attempted to contact the patient at her listed cell phone number of [phone] and received voicemail.  Message was left for patient to contact her doctor's office at or after 8:30 AM on 05/08/2018.  I will also have CMA attempt to contact the patient regarding her results and notification that medication has been sent into her pharmacy.

## 2018-05-07 NOTE — Telephone Encounter (Signed)
-----  Message from Antony Blackbird, MD sent at 05/05/2018  5:17 PM EDT ----- Please notify patient and her mother that the CXR shows no acute cardiopulmonary disease but that the peripheral and upper airspace disease seen on prior CT was not well seen on CXR

## 2018-05-12 ENCOUNTER — Other Ambulatory Visit: Payer: Self-pay | Admitting: Family Medicine

## 2018-05-12 DIAGNOSIS — N3000 Acute cystitis without hematuria: Secondary | ICD-10-CM

## 2018-05-12 MED ORDER — CIPROFLOXACIN HCL 500 MG PO TABS: 500.0000 mg | ORAL_TABLET | Freq: Two times a day (BID) | ORAL | 0 refills | Status: AC

## 2018-05-12 NOTE — Progress Notes (Signed)
Patient ID: Rhonda Franco, female   DOB: [birth year], 17 y.o.   MRN: [id number]   Patient with recent positive urine culture.  Bacteria cannot be treated with preferred agent, penicillin due to patient allergy and patient is also allergic to ampicillin and Bactrim.  I consulted with clinical pharmacist who suggested short course of Cipro or treatment with Macrodantin.  Will send in prescription to patient's pharmacy and again attempt to contact patient or her mother to make sure that they are aware of results and treatment suggestions as well as need for repeat of urinalysis to make sure that infection is cleared.  Patient also with mild increase in hemoglobin A1c at 5.8 consistent with prediabetes on recent labs.  Lipid panel showed LDL of 69 which was good.  CBC showed anemia with hemoglobin of 10.1.  TSH was normal.  Sed rate was elevated at 60.

## 2018-05-12 NOTE — Telephone Encounter (Signed)
Patient was called, patient stated she was informed from pcp. Patient stated she is taking meds as directed. No further questions at this time.

## 2018-05-13 ENCOUNTER — Encounter: Payer: Medicaid Other | Admitting: Obstetrics & Gynecology

## 2018-06-03 DIAGNOSIS — Z79899 Other long term (current) drug therapy: Secondary | ICD-10-CM | POA: Diagnosis not present

## 2018-06-03 DIAGNOSIS — M3210 Systemic lupus erythematosus, organ or system involvement unspecified: Secondary | ICD-10-CM | POA: Diagnosis not present

## 2018-06-13 ENCOUNTER — Ambulatory Visit: Payer: Medicaid Other | Attending: Family Medicine | Admitting: Family Medicine

## 2018-06-13 ENCOUNTER — Encounter: Payer: Medicaid Other | Admitting: Family

## 2018-06-13 ENCOUNTER — Encounter: Payer: Self-pay | Admitting: Family Medicine

## 2018-06-13 ENCOUNTER — Ambulatory Visit (HOSPITAL_BASED_OUTPATIENT_CLINIC_OR_DEPARTMENT_OTHER): Payer: Medicaid Other

## 2018-06-13 VITALS — BP 116/80 | HR 101 | Temp 99.4°F | Resp 18 | Ht 65.5 in | Wt 292.0 lb

## 2018-06-13 DIAGNOSIS — M2550 Pain in unspecified joint: Secondary | ICD-10-CM | POA: Diagnosis not present

## 2018-06-13 DIAGNOSIS — IMO0002 Reserved for concepts with insufficient information to code with codable children: Secondary | ICD-10-CM

## 2018-06-13 DIAGNOSIS — G479 Sleep disorder, unspecified: Secondary | ICD-10-CM

## 2018-06-13 DIAGNOSIS — Z79899 Other long term (current) drug therapy: Secondary | ICD-10-CM

## 2018-06-13 DIAGNOSIS — M329 Systemic lupus erythematosus, unspecified: Secondary | ICD-10-CM | POA: Diagnosis not present

## 2018-06-13 DIAGNOSIS — Z8249 Family history of ischemic heart disease and other diseases of the circulatory system: Secondary | ICD-10-CM | POA: Insufficient documentation

## 2018-06-13 DIAGNOSIS — N3001 Acute cystitis with hematuria: Secondary | ICD-10-CM | POA: Diagnosis not present

## 2018-06-13 DIAGNOSIS — Z881 Allergy status to other antibiotic agents status: Secondary | ICD-10-CM | POA: Diagnosis not present

## 2018-06-13 DIAGNOSIS — Z823 Family history of stroke: Secondary | ICD-10-CM | POA: Insufficient documentation

## 2018-06-13 DIAGNOSIS — K219 Gastro-esophageal reflux disease without esophagitis: Secondary | ICD-10-CM

## 2018-06-13 DIAGNOSIS — Z6841 Body Mass Index (BMI) 40.0 and over, adult: Secondary | ICD-10-CM | POA: Insufficient documentation

## 2018-06-13 DIAGNOSIS — G8929 Other chronic pain: Secondary | ICD-10-CM | POA: Diagnosis not present

## 2018-06-13 DIAGNOSIS — Z833 Family history of diabetes mellitus: Secondary | ICD-10-CM | POA: Insufficient documentation

## 2018-06-13 DIAGNOSIS — M545 Low back pain, unspecified: Secondary | ICD-10-CM

## 2018-06-13 DIAGNOSIS — Z880 Allergy status to penicillin: Secondary | ICD-10-CM | POA: Diagnosis not present

## 2018-06-13 DIAGNOSIS — R7303 Prediabetes: Secondary | ICD-10-CM | POA: Diagnosis not present

## 2018-06-13 LAB — POCT URINALYSIS DIP (CLINITEK)
Glucose, UA: NEGATIVE mg/dL
Ketones, POC UA: NEGATIVE mg/dL
Nitrite, UA: NEGATIVE
POC PROTEIN,UA: 30 — AB
Spec Grav, UA: >=1.03 — AB
Urobilinogen, UA: 0.2 U/dL
pH, UA: 6

## 2018-06-13 MED ORDER — FAMOTIDINE 20 MG PO TABS: 20.0000 mg | ORAL_TABLET | Freq: Every day | ORAL | 3 refills | Status: DC

## 2018-06-13 MED ORDER — METFORMIN HCL 500 MG PO TABS: ORAL_TABLET | ORAL | 3 refills | Status: DC

## 2018-06-13 MED ORDER — CIPROFLOXACIN HCL 500 MG PO TABS: 500.0000 mg | ORAL_TABLET | Freq: Two times a day (BID) | ORAL | 0 refills | Status: AC

## 2018-06-13 NOTE — Patient Instructions (Signed)

## 2018-06-13 NOTE — Progress Notes (Signed)
Subjective:    Patient ID: Rhonda Franco, female    DOB: 11-10-2000, 17 y.o.   MRN: [id number]  HPI      17 yo female seen in follow-up of chronic medical issues including Lupus along with long term use of diabetes and HgbA1c at last visit indicative of pre-diabetes. Patient also had a UA at her last visit and urine culture was positive for growth of bacteria.       Patient is accompanied by her mother who reports that patient missed her rheumatology appointment.  Patient with complaint of continued generalized joint pain/achiness but main area of pain is in her lower back.  Patient gets increased pain with prolonged sitting or prolonged standing.  Patient does not feel as if she has any radiation of pain at this time.  Patient reports a dull, aching pain that is about a 6 on a 0-to-10 scale.  Patient takes over-the-counter medicines as needed.  Patient continues to take Plaquenil to help with her lupus and joint pain.      Patient continues to take medicine for acid reflux and feels that her symptoms are stable/improved.  Patient denies any recent burping belching or nausea.  Patient denies any issues with chest pain or shortness of breath, patient has had no palpitations.  Patient denies any nausea/vomiting/diarrhea or constipation.  Patient states that since her last visit she has been making changes in her diet after being told that she had prediabetes.  Patient feels that she has lost weight.  Patient states that she has eliminated bread from her diet.  Patient has also tried to increase level of activity by walking and being more active.  Patient denies any urinary frequency, no increased thirst.  Patient did take antibiotics for urinary tract infection after her last visit.  Patient denies any current symptoms. Past Medical History:  Diagnosis Date  . Lupus ([place])    Family History  Problem Relation Age of Onset  . Diabetes Mother   . Arthritis Mother   . Diabetes Maternal Aunt   .  Diabetes Maternal Grandfather   . Heart disease Maternal Grandfather   . Stroke Paternal Grandmother    Family History  Problem Relation Age of Onset  . Diabetes Mother   . Arthritis Mother   . Diabetes Maternal Aunt   . Diabetes Maternal Grandfather   . Heart disease Maternal Grandfather   . Stroke Paternal Grandmother    Social History   Tobacco Use  . Smoking status: Never Smoker  . Smokeless tobacco: Never Used  Substance Use Topics  . Alcohol use: No    Frequency: Never  . Drug use: No   Allergies  Allergen Reactions  . Ampicillin Anaphylaxis, Swelling, Palpitations and Rash  . Other Shortness Of Breath  . Penicillins Anaphylaxis, Swelling, Palpitations and Rash    Has patient had a PCN reaction causing immediate rash, facial/tongue/throat swelling, SOB or lightheadedness with hypotension: Yes Has patient had a PCN reaction causing severe rash involving mucus membranes or skin necrosis: Yes Has patient had a PCN reaction that required hospitalization: Yes Has patient had a PCN reaction occurring within the last 10 years: Yes If all of the above answers are "NO", then may proceed with Cephalosporin use.   . Bactrim [Sulfamethoxazole-Trimethoprim] Rash      Review of Systems  Constitutional: Positive for fatigue (occasional). Negative for chills and fever.  Eyes: Negative for photophobia and visual disturbance.  Respiratory: Negative for cough and shortness of breath.  Gastrointestinal: Negative for abdominal pain, constipation, diarrhea and nausea.  Endocrine: Negative for polydipsia, polyphagia and polyuria.  Genitourinary: Negative for dysuria and frequency.  Musculoskeletal: Positive for arthralgias, back pain and myalgias. Negative for gait problem and joint swelling.  Neurological: Negative for dizziness and headaches.  Hematological: Negative for adenopathy. Does not bruise/bleed easily.       Objective:   Physical Exam BP 116/80 (BP Location: Left Arm,  Patient Position: Sitting, Cuff Size: Large)   Pulse (!) 101   Temp 99.4 F (37.4 C) (Oral)   Resp 18   Ht 5' 5.5" (1.664 m)   Wt 292 lb (132.5 kg)   LMP 05/27/2018   SpO2 98%   BMI 47.85 kg/m  nurses notes and vital signs reviewed General-well-nourished, well-developed obese young adult female in no acute distress ENT- TMs dull, nares with mild edema of the nasal turbinates, patient with a slightly narrowed posterior airway due to body habitus and large tongue base Neck-supple, no lymphadenopathy, no thyromegaly, no carotid bruit Lungs-clear to auscultation bilaterally Cardiovascular-regular rate and rhythm Abdomen-soft, nontender, truncal obesity Back- patient with lumbosacral discomfort to palpation and patient with thoracolumbar paraspinous spasm Extremities-no edema       Assessment & Plan:  1. Lupus [facility]) Patient missed her recent rheumatology appointment.  Rheumatology office was contacted at today's visit and because she missed this appointment with notified the office that she would not be able to attend the appointment, patient cannot be rescheduled at this particular office.  Patient will have new referral placed to rheumatology in follow-up of lupus.  Patient also with back pain at today's visit and patient will be scheduled for lumbar spine film but will also have urinalysis to look for possibility urinary tract infection as a contributor to back pain.  UA will also look for protein as patient with lupus.   - POCT URINALYSIS DIP (CLIN[facility]K) - DG Lumbar Spine Complete; Future - Amb ref to Medical Nutrition Therapy-MNT - Ambulatory referral to Rheumatology  2. Prediabetes At patient's last visit, patient with hemoglobin A1c of 5.8.  Patient has lost some weight since her last visit by making dietary changes.  Patient will take metformin to help with insulin resistance.  Patient will be referred to a nutritionist to help with further weight loss and low carbohydrate diet.   Because of her lupus, patient is on recurrent prednisone therapy which can increase her risk of diabetes.  Weight loss may also help decrease patient's joint pain.  Patient was made aware that use of metformin may also help with her issues with irregular menses.  Patient has upcoming GYN appointment regarding contraception/family planning as well as follow-up of her irregular menses. - POCT URINALYSIS DIP (CLINITEK) - Amb ref to Medical Nutrition Therapy-MNT - metFORMIN (GLUCOPHAGE) 500 MG tablet; Take one pill daily after the last meal of the day/evening  Dispense: 90 tablet; Refill: 3  3. Multiple joint pain Patient with multiple joint pains secondary to her lupus.  Patient will have a new referral to rheumatology and patient will also have an x-ray of the lumbar spine. - DG Lumbar Spine Complete; Future - Ambulatory referral to Rheumatology  4. Chronic bilateral low back pain, unspecified whether sciatica present Patient with low back pain and patient has been asked to have an x-ray of her lumbar spine.  Patient may take over-the-counter pain medications as needed - DG Lumbar Spine Complete; Future  5. Encounter for long-term (current) use of high-risk medication Patient will have a UA  and follow-up of use of high-risk medication and patient is also encouraged to have eye exam due to her use of Plaquenil.  Referral was placed at her previous visit.  The blood has been at the longest - POCT URINALYSIS DIP (CLINITEK)  6. Gastroesophageal reflux disease, esophagitis presence not specified Patient reports that Pepcid has helped with her reflux symptoms and a prescription provided.  Patient should continue to avoid late night eating as well as avoidance of known trigger foods - famotidine (PEPCID) 20 MG tablet; Take 1 tablet (20 mg total) by mouth daily.  Dispense: 90 tablet; Refill: 3  7. Acute cystitis with hematuria Patient with continued white blood cells as well as blood in urine sample.   Prescription provided for super 500 mg twice daily x7 days and urine will be sent for culture and patient will be notified if further treatment or change in medication is needed based on the culture results. - ciprofloxacin (CIPRO) 500 MG tablet; Take 1 tablet (500 mg total) by mouth 2 (two) times daily for 7 days.  Dispense: 14 tablet; Refill: 0  8. Morbid obesity with BMI of 45.0-49.9, adult [facility]) Patient has been referred for nutritional therapy.  Patient was congratulated on 10 pound weight loss since her last visit. - Amb ref to Medical Nutrition Therapy-MNT  9. Sleep disturbance Patient is scheduled for sleep study tonight for evaluation of possible sleep apnea  Allergies as of 06/13/2018      Reactions   Ampicillin Anaphylaxis, Swelling, Palpitations, Rash   Other Shortness Of Breath   Penicillins Anaphylaxis, Swelling, Palpitations, Rash   Has patient had a PCN reaction causing immediate rash, facial/tongue/throat swelling, SOB or lightheadedness with hypotension: Yes Has patient had a PCN reaction causing severe rash involving mucus membranes or skin necrosis: Yes Has patient had a PCN reaction that required hospitalization: Yes Has patient had a PCN reaction occurring within the last 10 years: Yes If all of the above answers are "NO", then may proceed with Cephalosporin use.   Bactrim [sulfamethoxazole-trimethoprim] Rash      Medication List        Accurate as of 06/13/18  9:35 AM. Always use your most recent med list.          aspirin EC 81 MG tablet Take 81 mg by mouth daily.   ciprofloxacin 500 MG tablet Commonly known as:  CIPRO Take 1 tablet (500 mg total) by mouth 2 (two) times daily for 7 days.   famotidine 20 MG tablet Commonly known as:  PEPCID Take 1 tablet (20 mg total) by mouth daily.   folic acid 1 MG tablet Commonly known as:  FOLVITE Take 1 mg by mouth daily.   furosemide 20 MG tablet Commonly known as:  LASIX Take 20 mg by mouth 2 (two) times  daily.   hydroxychloroquine 200 MG tablet Commonly known as:  PLAQUENIL Take 1 tablet (200 mg total) by mouth 2 (two) times daily.   metFORMIN 500 MG tablet Commonly known as:  GLUCOPHAGE Take one pill daily after the last meal of the day/evening   mycophenolate 500 MG tablet Commonly known as:  CELLCEPT Take 2 tablets (1,000 mg total) by mouth 2 (two) times daily.   predniSONE 10 MG tablet Commonly known as:  DELTASONE Take 0.5 tablets (5 mg total) by mouth daily with breakfast.       Return in about 3 months (around 09/12/2018) for prediabetes; sooner if not established with Rheum.

## 2018-06-15 LAB — URINE CULTURE

## 2018-06-16 DIAGNOSIS — H52203 Unspecified astigmatism, bilateral: Secondary | ICD-10-CM | POA: Diagnosis not present

## 2018-06-16 DIAGNOSIS — H5213 Myopia, bilateral: Secondary | ICD-10-CM | POA: Diagnosis not present

## 2018-06-17 DIAGNOSIS — H5213 Myopia, bilateral: Secondary | ICD-10-CM | POA: Diagnosis not present

## 2018-06-18 ENCOUNTER — Encounter: Payer: Self-pay | Admitting: Obstetrics and Gynecology

## 2018-06-18 ENCOUNTER — Other Ambulatory Visit (HOSPITAL_COMMUNITY)
Admission: RE | Admit: 2018-06-18 | Discharge: 2018-06-18 | Disposition: A | Discharge status: Home / Self Care | Payer: Medicaid Other | Source: Ambulatory Visit | Attending: Obstetrics and Gynecology | Admitting: Obstetrics and Gynecology

## 2018-06-18 ENCOUNTER — Ambulatory Visit (INDEPENDENT_AMBULATORY_CARE_PROVIDER_SITE_OTHER): Payer: Medicaid Other | Admitting: Obstetrics and Gynecology

## 2018-06-18 VITALS — BP 135/86 | HR 109 | Resp 20 | Ht 65.0 in | Wt 290.2 lb

## 2018-06-18 DIAGNOSIS — N898 Other specified noninflammatory disorders of vagina: Secondary | ICD-10-CM | POA: Insufficient documentation

## 2018-06-18 DIAGNOSIS — Z7251 High risk heterosexual behavior: Secondary | ICD-10-CM | POA: Diagnosis not present

## 2018-06-18 DIAGNOSIS — Z6841 Body Mass Index (BMI) 40.0 and over, adult: Secondary | ICD-10-CM | POA: Diagnosis not present

## 2018-06-18 LAB — POCT URINE PREGNANCY: Preg Test, Ur: NEGATIVE

## 2018-06-18 MED ORDER — METRONIDAZOLE 500 MG PO TABS: 500.0000 mg | ORAL_TABLET | Freq: Two times a day (BID) | ORAL | 1 refills | Status: AC

## 2018-06-18 MED ORDER — FLUCONAZOLE 150 MG PO TABS: ORAL_TABLET | ORAL | 1 refills | Status: DC

## 2018-06-18 NOTE — Progress Notes (Signed)
Obstetrics and Gynecology New Patient Evaluation  Appointment Date: 06/18/2018  OBGYN Clinic: Center for [facility]  Primary Care Provider: Antony Blackbird  Referring Provider: Antony Blackbird, MD  Chief Complaint:  Chief Complaint  Patient presents with  . Vaginal Discharge    whitish;odor;itchy x 2 wks    History of Present Illness: Rhonda Franco is a 17 y.o. African-American G0 (Patient's last menstrual period was 06/03/2018.), seen for the above chief complaint. Her past medical history is significant for BMI 40s, lupus, pre-diabetes.  Patient with vaginal discharge since July of this year. She states it's white, cottage cheese like with associated itching. She states that it has been persistent since it started and she had similar s/s last year and was cleared up with a medication she's not sure of. She does like to take long baths and she also shaves in the GU area and has been trying different types of soaps to see if that helps.   Patient also notes some occasional lower belly discomfort. PCP did Ucx with negative results earlier this month.   Review of Systems: as noted in the History of Present Illness.  Patient Active Problem List   Diagnosis Date Noted  . BMI 40.0-44.9, adult ([place]) 06/18/2018  . Pericardial effusion 03/05/2017  . Acanthosis nigricans 08/30/2015  . Family history of diabetes mellitus 07/29/2006  . Allergic rhinitis 07/29/2006    Past Medical History:  Past Medical History:  Diagnosis Date  . Lupus [facility])     Past Surgical History:  No past surgical history on file.  Past Obstetrical History:  G0  Past Gynecological History: As per HPI. Menarche age 50 Periods: qmonth, regular, 4-5d, not heavy but somewhat painful.  History of Pap Smear(s): no Sexually active: yes She is currently using no for contraception.  History of STI: no  Social History:  Social History   Socioeconomic History  . Marital status: Single   Spouse name: Not on file  . Number of children: Not on file  . Years of education: Not on file  . Highest education level: Not on file  Occupational History  . Not on file  Social Needs  . Financial resource strain: Not on file  . Food insecurity:    Worry: Not on file    Inability: Not on file  . Transportation needs:    Medical: Not on file    Non-medical: Not on file  Tobacco Use  . Smoking status: Never Smoker  . Smokeless tobacco: Never Used  Substance and Sexual Activity  . Alcohol use: No    Frequency: Never  . Drug use: No  . Sexual activity: Yes    Birth control/protection: None  Lifestyle  . Physical activity:    Days per week: Not on file    Minutes per session: Not on file  . Stress: Not on file  Relationships  . Social connections:    Talks on phone: Not on file    Gets together: Not on file    Attends religious service: Not on file    Active member of club or organization: Not on file    Attends meetings of clubs or organizations: Not on file    Relationship status: Not on file  . Intimate partner violence:    Fear of current or ex partner: Not on file    Emotionally abused: Not on file    Physically abused: Not on file    Forced sexual activity: Not on file  Other Topics Concern  .  Not on file  Social History Narrative  . Not on file    Family History:  Family History  Problem Relation Age of Onset  . Diabetes Mother   . Arthritis Mother   . Diabetes Maternal Aunt   . Diabetes Maternal Grandfather   . Heart disease Maternal Grandfather   . Stroke Paternal Grandmother     Medications Marabeth Holsopple had no medications administered during this visit. Current Outpatient Medications  Medication Sig Dispense Refill  . aspirin EC 81 MG tablet Take 81 mg by mouth daily.    . ciprofloxacin (CIPRO) 500 MG tablet Take 1 tablet (500 mg total) by mouth 2 (two) times daily for 7 days. 14 tablet 0  . famotidine (PEPCID) 20 MG tablet Take 1 tablet (20  mg total) by mouth daily. 90 tablet 3  . hydroxychloroquine (PLAQUENIL) 200 MG tablet Take 1 tablet (200 mg total) by mouth 2 (two) times daily. 60 tablet 0  . metFORMIN (GLUCOPHAGE) 500 MG tablet Take one pill daily after the last meal of the day/evening 90 tablet 3  . folic acid (FOLVITE) 1 MG tablet Take 1 mg by mouth daily.  10  . furosemide (LASIX) 20 MG tablet Take 20 mg by mouth 2 (two) times daily.     No current facility-administered medications for this visit.     Allergies Ampicillin; Other; Penicillins; and Bactrim [sulfamethoxazole-trimethoprim]   Physical Exam:  BP 135/86 (BP Location: Right Arm, Patient Position: Sitting, Cuff Size: Large)   Pulse (!) 109   Resp 20   Ht _0  (1.651 m)   Wt 290 lb 3.2 oz (131.6 kg)   LMP 06/03/2018   BMI 48.29 kg/m  Body mass index is 48.29 kg/m. General appearance: Well nourished, well developed female in no acute distress.  Cardiovascular: normal s1 and s2.  No murmurs, rubs or gallops. Respiratory:  Clear to auscultation bilateral. Normal respiratory effort Abdomen: positive bowel sounds and no masses, hernias; diffusely non tender to palpation, non distended Neuro/Psych:  Normal mood and affect.  Skin:  Warm and dry.  Lymphatic:  No inguinal lymphadenopathy.   Pelvic exam: is not limited by body habitus EGBUS: within normal limits with mild b/l erythema on labia majora Vagina: white-milky, clear d/c in vault, no VB Cervix: nttp, no bleeding or discharge Uterus: small, nttp, mobile  Laboratory: UPT negative  Radiology: none  Assessment: pt doing well  Plan:  1. Vaginal discharge Will go ahead and send in diflucan 150 q72h x 3 and flagyl bid x 7d. F/u swab results - Cervicovaginal ancillary only( [place]) - POCT urine pregnancy  2. BMI 40.0-44.9, adult ([place])  3. High risk heterosexual behavior Pt amenable to blood testing. D/w her abstinence and barrier is only way to protect against STI. Pt given pills in the  past but hard time to remember them. Options d/w her and she is amenable to nexplanon. Brochure given, pt told to be abstinent and will see her back in a few weeks for placement and to follow up her discharge s/s. I also told her that can investigate her lower belly discomfort more if it persists at this follow up visit, too. I told her I recommend trying pads, trimming and not shaving and to consider showers over baths to see if that helps.   - RPR - HIV antibody (with reflex) - Hepatitis B Surface AntiGEN - Hepatitis C Antibody  RTC: 2wks for nexplanon placement and f/u discharge and discomfort s/s.   Eduard Clos  Cassell Smiles MD Attending Center for [facility])

## 2018-06-19 LAB — HEPATITIS B SURFACE ANTIGEN: Hepatitis B Surface Ag: NEGATIVE

## 2018-06-19 LAB — HEPATITIS C ANTIBODY: Hep C Virus Ab: <0.1 s/co ratio (ref 0.0–0.9)

## 2018-06-19 LAB — HIV ANTIBODY (ROUTINE TESTING W REFLEX): HIV Screen 4th Generation wRfx: NONREACTIVE

## 2018-06-19 LAB — RPR: RPR Ser Ql: NONREACTIVE

## 2018-06-20 ENCOUNTER — Telehealth (INDEPENDENT_AMBULATORY_CARE_PROVIDER_SITE_OTHER): Payer: Self-pay

## 2018-06-20 LAB — CERVICOVAGINAL ANCILLARY ONLY
Bacterial vaginitis: NEGATIVE
Candida vaginitis: POSITIVE — AB
Chlamydia: NEGATIVE
Neisseria Gonorrhea: NEGATIVE
Trichomonas: NEGATIVE

## 2018-06-20 NOTE — Telephone Encounter (Signed)
Patient is aware that urine culture showed mixed urogenital flora. Instructed to stop taking abx. Rhonda Franco

## 2018-06-20 NOTE — Telephone Encounter (Signed)
-----  Message from Antony Blackbird, MD sent at 06/17/2018  5:37 PM EST ----- Urine culture showed mixed urogenital flora. Ok to stop the antibiotic

## 2018-06-23 ENCOUNTER — Encounter: Payer: Self-pay | Admitting: Family Medicine

## 2018-07-04 DIAGNOSIS — H5213 Myopia, bilateral: Secondary | ICD-10-CM | POA: Diagnosis not present

## 2018-07-08 ENCOUNTER — Encounter: Payer: Self-pay | Admitting: Family Medicine

## 2018-07-08 ENCOUNTER — Other Ambulatory Visit: Payer: Self-pay | Admitting: Family Medicine

## 2018-07-08 DIAGNOSIS — R7303 Prediabetes: Secondary | ICD-10-CM

## 2018-07-08 DIAGNOSIS — K089 Disorder of teeth and supporting structures, unspecified: Secondary | ICD-10-CM

## 2018-07-08 NOTE — Progress Notes (Signed)
Patient ID: Rhonda Franco, female   DOB: [birth year], 17 y.o.   MRN: [id number]   17 yo female who sent electronic message requesting dental referral for general maintenance/cleaning as well as referral to Orthodontist as she has a concern that her teeth are shifting s/p removal of her braces. Referrals placed and message sent to patient that she should also check with her insurance company to see which local dentists/orthodontists are covered by her health plan.

## 2018-07-08 NOTE — Telephone Encounter (Signed)
Dental Referral request

## 2018-07-14 ENCOUNTER — Ambulatory Visit: Payer: Medicaid Other | Admitting: Dietician

## 2018-07-15 ENCOUNTER — Encounter: Payer: Self-pay | Admitting: Family Medicine

## 2018-07-18 ENCOUNTER — Encounter: Payer: Self-pay | Admitting: Obstetrics and Gynecology

## 2018-07-18 ENCOUNTER — Ambulatory Visit (INDEPENDENT_AMBULATORY_CARE_PROVIDER_SITE_OTHER): Payer: Medicaid Other | Admitting: Obstetrics and Gynecology

## 2018-07-18 ENCOUNTER — Encounter: Payer: Self-pay | Admitting: General Practice

## 2018-07-18 VITALS — BP 116/74 | HR 102 | Ht 65.0 in | Wt 279.0 lb

## 2018-07-18 DIAGNOSIS — Z30017 Encounter for initial prescription of implantable subdermal contraceptive: Secondary | ICD-10-CM

## 2018-07-18 DIAGNOSIS — Z3202 Encounter for pregnancy test, result negative: Secondary | ICD-10-CM

## 2018-07-18 LAB — POCT URINE PREGNANCY: PREG TEST UR: NEGATIVE

## 2018-07-18 MED ORDER — ETONOGESTREL 68 MG ~~LOC~~ IMPL
68.0000 mg | DRUG_IMPLANT | Freq: Once | SUBCUTANEOUS | Status: AC
  Administered 2018-07-18: 68 mg via SUBCUTANEOUS

## 2018-07-18 NOTE — Progress Notes (Signed)
18 yo G0 here for Nexplanon insertion  Patient given informed consent, signed copy in the chart, time out was performed. Pregnancy test was negative. Appropriate time out taken.  Patient's right arm was prepped and draped in the usual sterile fashion.. The ruler used to measure and mark insertion area.  Patient was prepped with alcohol swab and then injected with 2 cc of 1% lidocaine with epinephrine.  Patient was prepped with betadine, Nexplanon removed form packaging.  Device confirmed in needle, then inserted full length of needle and withdrawn per handbook instructions.  Patient insertion site covered with a band-aid and [place].   Minimal blood loss.  Patient tolerated the procedure well.   Patient advised to use condoms for the next 3 weeks and with every sexual encounter for STD prevention

## 2018-07-22 ENCOUNTER — Encounter: Payer: Self-pay | Admitting: Internal Medicine

## 2018-07-22 ENCOUNTER — Ambulatory Visit (INDEPENDENT_AMBULATORY_CARE_PROVIDER_SITE_OTHER): Payer: Medicaid Other | Admitting: Internal Medicine

## 2018-07-22 VITALS — BP 128/75 | HR 109 | Ht 65.0 in | Wt 278.0 lb

## 2018-07-22 DIAGNOSIS — Z309 Encounter for contraceptive management, unspecified: Secondary | ICD-10-CM

## 2018-07-22 NOTE — Progress Notes (Signed)
   Subjective:    Rhonda Franco - 18 y.o. female MRN [id number]  Date of birth: 2001-06-27  HPI  Rhonda Franco is a 18 y.o. G0P0000 female here for concerns with her Nexplanon. Nexplanon was inserted on 5/94 without complication in her right arm. Patient believes it has migrated. Thinks it was inserted vertically into her skin. Also endorsing some itching and burning in the skin overlying the nexplanon. No drainage or skin color changes. No fevers.      OB History    Gravida  0   Para  0   Term  0   Preterm  0   AB  0   Living  0     SAB  0   TAB  0   Ectopic  0   Multiple  0   Live Births  0            -  reports that she has never smoked. She has never used smokeless tobacco. - Review of Systems: Per HPI. - Past Medical History: Patient Active Problem List   Diagnosis Date Noted  . BMI 40.0-44.9, adult ([place]) 06/18/2018  . Vaginal discharge 06/18/2018  . High risk heterosexual behavior 06/18/2018  . Pericardial effusion 03/05/2017  . Acanthosis nigricans 08/30/2015  . Family history of diabetes mellitus 07/29/2006  . Allergic rhinitis 07/29/2006   - Medications: reviewed and updated   Objective:   Physical Exam BP 128/75   Pulse (!) 109   Ht _0  (1.651 m)   Wt 278 lb (126.1 kg)   LMP 07/01/2018 (Exact Date)   BMI 46.26 kg/m  Gen: NAD, alert, cooperative with exam, well-appearing Skin: Tape in place over Nexplanon insertion site, removed for exam. Right upper arm without erythema, increased warmth or bruising. Small insertion site visible without drainage or bleeding. Nexplanon palpated in line with insertion site between the biceps and triceps muscles. No TTP.     Assessment & Plan:   1. Encounter for contraceptive management, unspecified type Discussed with patient that Nexplanon is inserted horizontally and that her contraceptive device is palpated to be in the correct location. No evidence of infection. Discussed that itching likely  related to normal healing process. Instructed that she could remove tape from her arm and does not need to bandage the area anymore. Return precautions discussed.    Routine preventative health maintenance measures emphasized. Please refer to After Visit Summary for other counseling recommendations.   Return in about 1 year (around 07/23/2019) for annual exam.  Phill Myron, D.O. OB Fellow  07/22/2018, 4:52 PM

## 2018-07-22 NOTE — Patient Instructions (Signed)
Your nexplanon is in the correct place in your arm and looks to be healing well! Please let us know if you have any other concerns or problems.

## 2018-07-29 DIAGNOSIS — Z79899 Other long term (current) drug therapy: Secondary | ICD-10-CM | POA: Diagnosis not present

## 2018-07-29 DIAGNOSIS — M7051 Other bursitis of knee, right knee: Secondary | ICD-10-CM | POA: Diagnosis not present

## 2018-07-29 DIAGNOSIS — M329 Systemic lupus erythematosus, unspecified: Secondary | ICD-10-CM | POA: Diagnosis not present

## 2018-07-29 DIAGNOSIS — R0781 Pleurodynia: Secondary | ICD-10-CM | POA: Diagnosis not present

## 2018-08-01 DIAGNOSIS — R7611 Nonspecific reaction to tuberculin skin test without active tuberculosis: Secondary | ICD-10-CM | POA: Diagnosis not present

## 2018-08-12 ENCOUNTER — Encounter: Payer: Self-pay | Admitting: Family Medicine

## 2018-08-13 ENCOUNTER — Telehealth: Payer: Self-pay | Admitting: Family Medicine

## 2018-08-13 NOTE — Telephone Encounter (Signed)
No fax with this information is located in PCP's box

## 2018-08-13 NOTE — Telephone Encounter (Signed)
Call placed to [facility] and she agreed to re-fax the paperwork today

## 2018-08-13 NOTE — Telephone Encounter (Signed)
Rhonda Franco from [place] office([facility])called requesting an update on some paperwork she faxed over on 08/08/2018, please follow up. the paperwork was concerning an upcoming surgery  [phone]

## 2018-08-21 ENCOUNTER — Other Ambulatory Visit: Payer: Self-pay | Admitting: Family Medicine

## 2018-08-21 DIAGNOSIS — D509 Iron deficiency anemia, unspecified: Secondary | ICD-10-CM

## 2018-08-21 MED ORDER — FERROUS SULFATE 325 (65 FE) MG PO TABS: 325.0000 mg | ORAL_TABLET | Freq: Two times a day (BID) | ORAL | 3 refills | Status: DC

## 2018-08-21 NOTE — Progress Notes (Signed)
Patient ID: Rhonda Franco, female   DOB: 2001/05/08, 18 y.o.   MRN: [id number]   Message received from patient that her rheumatologist wanted patient to started on iron. RX for ferrous sulfate will be sent into patient's pharmacy

## 2018-08-28 NOTE — Telephone Encounter (Signed)
Oral Surgery called for medical clearance.

## 2018-08-31 NOTE — Telephone Encounter (Signed)
Message left on answering machine for patient or her mother to call the office on Monday regarding paperwork for the need for any presurgical treatment prior to planned dental [facility].  While patient does not have any of the conditions such as history of infective endocarditis or cardiac conditions such as valve replacement or congenital heart defect which would definitively require intervention with an antibiotic, patient does have lupus for which she has been on high risk medication, Plaquenil and patient due to her lupus may be considered immunocompromised and patient with history of pericarditis and pericardial effusion.  Patient would likely benefit from administration of medications prior to her dental extraction however patient has anaphylactic reactions to penicillin and ampicillin as well as allergy to Bactrim.  I would like to confirm with the mother as to whether or not patient has taken clindamycin in the past or erythromycin/azithromycin which may be needed in place of penicillin type antibiotics which are generally used for dental prophylaxis.

## 2018-09-12 ENCOUNTER — Encounter: Payer: Self-pay | Admitting: Family Medicine

## 2018-09-12 ENCOUNTER — Ambulatory Visit: Payer: Medicaid Other | Attending: Family Medicine | Admitting: Family Medicine

## 2018-09-12 VITALS — BP 119/83 | HR 89 | Temp 98.9°F | Resp 18 | Ht 65.0 in | Wt 271.0 lb

## 2018-09-12 DIAGNOSIS — R058 Other specified cough: Secondary | ICD-10-CM

## 2018-09-12 DIAGNOSIS — K089 Disorder of teeth and supporting structures, unspecified: Secondary | ICD-10-CM

## 2018-09-12 DIAGNOSIS — Z7982 Long term (current) use of aspirin: Secondary | ICD-10-CM | POA: Insufficient documentation

## 2018-09-12 DIAGNOSIS — Z880 Allergy status to penicillin: Secondary | ICD-10-CM | POA: Diagnosis not present

## 2018-09-12 DIAGNOSIS — I313 Pericardial effusion (noninflammatory): Secondary | ICD-10-CM

## 2018-09-12 DIAGNOSIS — N644 Mastodynia: Secondary | ICD-10-CM | POA: Diagnosis not present

## 2018-09-12 DIAGNOSIS — R05 Cough: Secondary | ICD-10-CM | POA: Diagnosis not present

## 2018-09-12 DIAGNOSIS — Z79899 Other long term (current) drug therapy: Secondary | ICD-10-CM

## 2018-09-12 DIAGNOSIS — Z9109 Other allergy status, other than to drugs and biological substances: Secondary | ICD-10-CM | POA: Diagnosis not present

## 2018-09-12 DIAGNOSIS — Z833 Family history of diabetes mellitus: Secondary | ICD-10-CM | POA: Diagnosis not present

## 2018-09-12 DIAGNOSIS — R0789 Other chest pain: Secondary | ICD-10-CM

## 2018-09-12 DIAGNOSIS — IMO0002 Reserved for concepts with insufficient information to code with codable children: Secondary | ICD-10-CM

## 2018-09-12 DIAGNOSIS — D509 Iron deficiency anemia, unspecified: Secondary | ICD-10-CM | POA: Diagnosis not present

## 2018-09-12 DIAGNOSIS — D649 Anemia, unspecified: Secondary | ICD-10-CM | POA: Insufficient documentation

## 2018-09-12 DIAGNOSIS — Z6841 Body Mass Index (BMI) 40.0 and over, adult: Secondary | ICD-10-CM | POA: Diagnosis not present

## 2018-09-12 DIAGNOSIS — Z881 Allergy status to other antibiotic agents status: Secondary | ICD-10-CM | POA: Insufficient documentation

## 2018-09-12 DIAGNOSIS — R7303 Prediabetes: Secondary | ICD-10-CM | POA: Diagnosis present

## 2018-09-12 DIAGNOSIS — M329 Systemic lupus erythematosus, unspecified: Secondary | ICD-10-CM | POA: Diagnosis not present

## 2018-09-12 DIAGNOSIS — Z8701 Personal history of pneumonia (recurrent): Secondary | ICD-10-CM | POA: Diagnosis not present

## 2018-09-12 DIAGNOSIS — Z7984 Long term (current) use of oral hypoglycemic drugs: Secondary | ICD-10-CM | POA: Insufficient documentation

## 2018-09-12 DIAGNOSIS — I3139 Other pericardial effusion (noninflammatory): Secondary | ICD-10-CM

## 2018-09-12 DIAGNOSIS — R079 Chest pain, unspecified: Secondary | ICD-10-CM | POA: Diagnosis not present

## 2018-09-12 LAB — POCT GLYCOSYLATED HEMOGLOBIN (HGB A1C): Hemoglobin A1C: 5.4 % (ref 4.0–5.6)

## 2018-09-12 MED ORDER — METFORMIN HCL 500 MG PO TABS: ORAL_TABLET | ORAL | 3 refills | Status: DC

## 2018-09-12 MED ORDER — CLINDAMYCIN HCL 300 MG PO CAPS: ORAL_CAPSULE | ORAL | 1 refills | Status: DC

## 2018-09-12 NOTE — Progress Notes (Signed)
Established Patient Office Visit  Subjective:  Patient ID: Brytni Dray, female    DOB: 2000-12-30  Age: 18 y.o. MRN: [id number]  CC: follow-up of pre-diabetes/anemia Chief Complaint  Patient presents with  . Follow-up    HPI Sabrena Lyles presents for follow-up of pre-diabetes/anemia and patient has new complaints of left breast pain/pain in upper mid to left chest and dry cough since having pneumonia in Dec-Jan. patient reports that she continues to take metformin for her prediabetes and denies any abdominal pain, nausea or diarrhea related to use of metformin.  Patient has restricted all sugars from her diet and has started having issues with headaches and not feeling well which she believes is related to her dietary changes.  Patient denies any increased thirst, no urinary frequency, no blurred vision and no increased hunger related to her prediabetes.  Patient continues to take ferrous sulfate to help with anemia and patient does get some constipation related to use of iron therapy.  Patient continues to take Plaquenil for her lupus as ordered per her rheumatologist.  Patient denies any current joint pain.  Patient also continues to take Lasix 20 mg twice daily which she was placed on by cardiology after hospitalization back in [place] at which time patient was found to have a pericardial effusion.  Patient reports that she has not establish care with a local cardiologist.  Patient reports that she has had an abnormal sensation in her mid upper chest as well as tenderness to palpation over the left mid to upper chest above her breast.  Patient believes that the chest wall tenderness has been going on for over a month.  Patient is not sure how long she has had the mid chest discomfort but it is likely also been more than 4 weeks.      Patient reports that she has been scheduled to have extraction of her wisdom teeth by an oral surgeon but states that this is been delayed because she has  been asked to have a CT scan done of her mouth/face prior to scheduling of the dental appointment.  Patient has her mother online through face time during office visit and mother believes that patient has taken clindamycin in the past prior to dental procedures.  Patient states that overall she feels fairly well at this time but is concerned about the left-sided chest/breast discomfort.  Patient denies palpitations, patient's cough has been nonproductive.  Patient denies any increased nasal congestion or sensation of postnasal drainage.  No fever or chills.  No increased peripheral edema. Patient has had ophthalmology follow-up in the past 6 months regarding use of Plaquenil.   Past Medical History:  Diagnosis Date  . Anemia   . Lupus ([place])   . Prediabetes     History reviewed. No pertinent surgical history.  Family History  Problem Relation Age of Onset  . Diabetes Mother   . Arthritis Mother   . Diabetes Maternal Aunt   . Diabetes Maternal Grandfather   . Heart disease Maternal Grandfather   . Stroke Paternal Grandmother     Social History   Socioeconomic History  . Marital status: Single    Spouse name: Not on file  . Number of children: Not on file  . Years of education: Not on file  . Highest education level: Not on file  Occupational History  . Not on file  Social Needs  . Financial resource strain: Not on file  . Food insecurity:    Worry: Not on  file    Inability: Not on file  . Transportation needs:    Medical: Not on file    Non-medical: Not on file  Tobacco Use  . Smoking status: Never Smoker  . Smokeless tobacco: Never Used  Substance and Sexual Activity  . Alcohol use: No    Frequency: Never  . Drug use: No  . Sexual activity: Yes    Birth control/protection: None  Lifestyle  . Physical activity:    Days per week: Not on file    Minutes per session: Not on file  . Stress: Not on file  Relationships  . Social connections:    Talks on phone: Not on  file    Gets together: Not on file    Attends religious service: Not on file    Active member of club or organization: Not on file    Attends meetings of clubs or organizations: Not on file    Relationship status: Not on file  . Intimate partner violence:    Fear of current or ex partner: Not on file    Emotionally abused: Not on file    Physically abused: Not on file    Forced sexual activity: Not on file  Other Topics Concern  . Not on file  Social History Narrative  . Not on file    Outpatient Medications Prior to Visit  Medication Sig Dispense Refill  . aspirin EC 81 MG tablet Take 81 mg by mouth daily.    . famotidine (PEPCID) 20 MG tablet Take 1 tablet (20 mg total) by mouth daily. 90 tablet 3  . ferrous sulfate 325 (65 FE) MG tablet Take 1 tablet (325 mg total) by mouth 2 (two) times daily with a meal. 60 tablet 3  . furosemide (LASIX) 20 MG tablet Take 20 mg by mouth 2 (two) times daily.    . hydroxychloroquine (PLAQUENIL) 200 MG tablet Take 1 tablet (200 mg total) by mouth 2 (two) times daily. 60 tablet 0  . metFORMIN (GLUCOPHAGE) 500 MG tablet Take one pill daily after the last meal of the day/evening 90 tablet 3   No facility-administered medications prior to visit.     Allergies  Allergen Reactions  . Ampicillin Anaphylaxis, Swelling, Palpitations and Rash  . Other Shortness Of Breath  . Penicillins Anaphylaxis, Swelling, Palpitations and Rash    Has patient had a PCN reaction causing immediate rash, facial/tongue/throat swelling, SOB or lightheadedness with hypotension: Yes Has patient had a PCN reaction causing severe rash involving mucus membranes or skin necrosis: Yes Has patient had a PCN reaction that required hospitalization: Yes Has patient had a PCN reaction occurring within the last 10 years: Yes If all of the above answers are "NO", then may proceed with Cephalosporin use.   . Bactrim [Sulfamethoxazole-Trimethoprim] Rash    ROS Review of Systems    Constitutional: Positive for fatigue (mild). Negative for chills and fever.  HENT: Negative for congestion, ear pain, nosebleeds, sinus pressure, sinus pain and sore throat.   Eyes: Negative for photophobia and visual disturbance.  Respiratory: Positive for cough. Negative for chest tightness, shortness of breath and wheezing.   Cardiovascular: Positive for chest pain (upper mid chest discomfort). Negative for palpitations and leg swelling.  Gastrointestinal: Positive for constipation (mild). Negative for abdominal pain, diarrhea, nausea and vomiting.  Endocrine: Negative for cold intolerance, heat intolerance, polydipsia, polyphagia and polyuria.  Genitourinary: Negative for dysuria, flank pain and frequency.  Musculoskeletal: Negative for arthralgias, back pain and gait problem.  Neurological: Positive for headaches. Negative for dizziness.  Hematological: Negative for adenopathy. Does not bruise/bleed easily.      Objective:    Physical Exam  Constitutional: She is oriented to person, place, and time. She appears well-developed and well-nourished.  Obese young adult female  HENT:  Head: Normocephalic and atraumatic.  Right Ear: External ear normal.  Left Ear: External ear normal.  Nose: Nose normal.  Mouth/Throat: Oropharynx is clear and moist.  Slightly narrowed posterior airway- appears less narrow than at prior visits likely due to weight loss  Eyes: Conjunctivae and EOM are normal.  Neck: Normal range of motion. Neck supple. No JVD present. No thyromegaly present.  Cardiovascular: Normal rate and regular rhythm.  Pulmonary/Chest: Effort normal and breath sounds normal. No respiratory distress. She has no wheezes. She exhibits tenderness.  Abdominal: Soft. Bowel sounds are normal. There is no abdominal tenderness. There is no rebound and no guarding.  Musculoskeletal: Normal range of motion.        General: No tenderness or edema.       Arms:  Lymphadenopathy:    She has  no cervical adenopathy.  Neurological: She is alert and oriented to person, place, and time. No cranial nerve deficit.  Skin: Skin is warm and dry. No rash noted.     Psychiatric: She has a normal mood and affect. Her behavior is normal. Thought content normal.   Mother of patient was on facetime for entire visit  BP 119/83 (BP Location: Left Arm, Patient Position: Sitting, Cuff Size: Large)   Pulse 89   Temp 98.9 F (37.2 C) (Oral)   Resp 18   Ht _0  (1.651 m)   Wt 271 lb (122.9 kg)   SpO2 100%   BMI 45.10 kg/m nurses notes and vital signs reviewed Wt Readings from Last 3 Encounters:  09/12/18 271 lb (122.9 kg) (>99 %, Z= 2.63)*  07/22/18 278 lb (126.1 kg) (>99 %, Z= 2.67)*  07/18/18 279 lb (126.6 kg) (>99 %, Z= 2.67)*   * Growth percentiles are based on CDC (Girls, 2-20 Years) data.     Health Maintenance Due  Topic Date Due  . TETANUS/TDAP  07/04/2018      Lab Results  Component Value Date   TSH 3.650 05/05/2018   Lab Results  Component Value Date   WBC 9.1 05/05/2018   HGB 10.1 (L) 05/05/2018   HCT 32.3 (L) 05/05/2018   MCV 76 (L) 05/05/2018   PLT 366 05/05/2018   Lab Results  Component Value Date   NA 136 05/05/2018   K 4.2 05/05/2018   CO2 23 05/05/2018   GLUCOSE 76 05/05/2018   BUN 6 05/05/2018   CREATININE 0.59 05/05/2018   BILITOT 0.4 05/05/2018   ALKPHOS 53 05/05/2018   AST 40 05/05/2018   ALT 18 05/05/2018   PROT 8.4 05/05/2018   ALBUMIN 3.5 05/05/2018   CALCIUM 8.7 05/05/2018   ANIONGAP 6 04/22/2018   Lab Results  Component Value Date   CHOL 116 05/05/2018   Lab Results  Component Value Date   HDL 24 (L) 05/05/2018   Lab Results  Component Value Date   LDLCALC 69 05/05/2018   Lab Results  Component Value Date   TRIG 115 (H) 05/05/2018   Lab Results  Component Value Date   CHOLHDL 4.8 (H) 05/05/2018   Lab Results  Component Value Date   HGBA1C 5.8 (H) 05/05/2018      Assessment & Plan:  1. Prediabetes Patient has  lowered her hemoglobin A1c from 5.8 down to current 5.4 and use of metformin.  Patient wishes to continue the Metformin and patient was asked to make sure that she is not restricting her diet to the point that she is not feeling well.  Patient encouraged to continue low carbohydrate diet and incorporate exercise such as walking.  Patient will have CMP in follow-up of prediabetes and use of medications. - HgB A1c - metFORMIN (GLUCOPHAGE) 500 MG tablet; Take one pill daily after the last meal of the day/evening  Dispense: 90 tablet; Refill: 3 - Comprehensive metabolic panel  2. Chest discomfort Patient with complaint of mid upper chest discomfort as well as recurrent dry cough.  Patient with history of a pericardial effusion and patient will be referred to cardiology for further evaluation and treatment.  Patient is also status post treatment for pneumonia earlier in the year and patient will have chest CT for further evaluation of her chest discomfort and history of pneumonia.  Patient will also have CBC to look for anemia or elevated white blood cell count in follow-up of patient's complaint of chest discomfort - Ambulatory referral to Cardiology - CT Chest Wo Contrast; Future - CBC with Differential  3. Discomfort of chest wall Patient with tenderness palpation in the left medial chest and area which would also include extension of breast tissue.  Patient will be scheduled for ultrasound of the left breast and depending on the results, patient may also need diagnostic mammogram - US BREAST LTD UNI LEFT INC AXILLA; Future  4. Lupus [facility]) Patient with lupus for which she is currently on Plaquenil and patient is being followed by rheumatology.  Patient also with history of a pericardial effusion which was thought to be related to her lupus.  Patient will be referred to cardiology for further evaluation.  Patient will have CMP in follow-up of lupus/medication use.  Patient reports that she continues to  be on Lasix 20 mg twice daily per her last cardiologist in [place] referral to Cardiology - Comprehensive metabolic panel  5. Pericardial effusion Patient with history of a pericardial effusion and will be referred back to cardiology.  Patient will continue the use of her Lasix 20 mg twice daily.  Electrolytes will be checked as part of BMP.  Patient is also being referred to establish with a local cardiologist and patient will has chest CT as patient with complaint of chest discomfort, dry cough with history of prior pneumonia earlier in the year. - Ambulatory referral to Cardiology - CT Chest Wo Contrast; Future  6. Breast pain, left Patient with pain in the left upper medial chest wall/breast area and patient will be referred for ultrasound and if ultrasound is abnormal or inconclusive, patient may need mammogram.  Patient is also being scheduled for chest CT - US BREAST LTD UNI LEFT INC AXILLA; Future  7. Chest pain, unspecified type Patient with complaint of mid upper chest pain as well as reproducible left lateral chest pain.  Patient is being scheduled for CT of the chest due to history of pericardial effusion as well as pneumonia earlier in the year.  Patient has been referred for ultrasound of the left chest wall/breast and patient additionally will have CMP and CBC at today's visit - CT Chest Wo Contrast; Future - Comprehensive metabolic panel - CBC with Differential  8. Recurrent non-productive cough Patient with complaint of recurrent nonproductive cough.  Patient denies any reflux type symptoms at today's visit.  Patient also  denies nasal congestion and postnasal drainage.  Patient will have chest CT secondary to history of pneumonia earlier in the year, lupus and history of pericardial effusion - CT Chest Wo Contrast; Future  9. Long-term use of high-risk medication Patient will have CMP at today's visit in follow-up for long-term use of high-risk medication such  as Plaquenil and patient reports that she has had eye exam within the past 6 months also regarding her use of Plaquenil - Comprehensive metabolic panel  10. Microcytic anemia Patient with microcytic anemia for which she is currently on iron therapy.  Patient has had placement of Nexplanon by OB/GYN in January of this year and patient reports that she is now no longer having her menses.  Patient will have CBC to see if her anemia has improved or resolved - CBC with Differential  11. Dental disorder Patient reports that she has been evaluated for extraction of wisdom teeth by oral surgeon.  Because of patient's history of pericardial effusion as well as lupus, I would suggest that patient take clindamycin 300 mg 2 capsules approximately 1 hour before any dental procedures - clindamycin (CLEOCIN) 300 MG capsule; Take 2 pills about 1 hour prior to dental procedures  Dispense: 4 capsule; Refill: 1  12. BMI 45.0-49.9, adult [facility]) Patient with a 7 pound weight loss since her visit in mid January.  Patient is encouraged to continue efforts at weight loss through dietary changes and low impact cardiovascular exercise.  Patient will continue use of metformin as well to help with insulin resistance/prediabetes  An After Visit Summary was printed and given to the patient.  Follow-up: Return in about 4 weeks (around 10/10/2018) for chest discomfort/cough-sooner if needed.   Antony Blackbird, MD

## 2018-09-12 NOTE — Progress Notes (Signed)
Patient complains of left breast pain occurring 3 weeks ago intermittently. Patient complains of scratchy dry cough being present for the past month after having pneumonia from December to February.  Patient states she eliminated sugar from her diet and now feels week and has headaches.

## 2018-09-13 LAB — CBC WITH DIFFERENTIAL/PLATELET
Basophils Absolute: 0 x10E3/uL (ref 0.0–0.2)
Basos: 0 %
EOS (ABSOLUTE): 0 x10E3/uL (ref 0.0–0.4)
Eos: 0 %
Hematocrit: 38.6 % (ref 34.0–46.6)
Hemoglobin: 12.2 g/dL (ref 11.1–15.9)
Immature Grans (Abs): 0 x10E3/uL (ref 0.0–0.1)
Immature Granulocytes: 0 %
Lymphocytes Absolute: 1.2 x10E3/uL (ref 0.7–3.1)
Lymphs: 11 %
MCH: 24.5 pg — ABNORMAL LOW (ref 26.6–33.0)
MCHC: 31.6 g/dL (ref 31.5–35.7)
MCV: 78 fL — ABNORMAL LOW (ref 79–97)
Monocytes Absolute: 0.7 x10E3/uL (ref 0.1–0.9)
Monocytes: 6 %
Neutrophils Absolute: 8.8 x10E3/uL — ABNORMAL HIGH (ref 1.4–7.0)
Neutrophils: 83 %
Platelets: 405 x10E3/uL (ref 150–450)
RBC: 4.98 x10E6/uL (ref 3.77–5.28)
RDW: 17.1 % — ABNORMAL HIGH (ref 11.7–15.4)
WBC: 10.7 x10E3/uL (ref 3.4–10.8)

## 2018-09-13 LAB — COMPREHENSIVE METABOLIC PANEL WITH GFR
ALT: 17 IU/L (ref 0–32)
AST: 18 IU/L (ref 0–40)
Albumin/Globulin Ratio: 1.1 — ABNORMAL LOW (ref 1.2–2.2)
Albumin: 4.1 g/dL (ref 3.9–5.0)
Alkaline Phosphatase: 62 IU/L (ref 39–117)
BUN/Creatinine Ratio: 11 (ref 9–23)
BUN: 6 mg/dL (ref 6–20)
Bilirubin Total: <0.2 mg/dL (ref 0.0–1.2)
CO2: 21 mmol/L (ref 20–29)
Calcium: 9.1 mg/dL (ref 8.7–10.2)
Chloride: 101 mmol/L (ref 96–106)
Creatinine, Ser: 0.55 mg/dL — ABNORMAL LOW (ref 0.57–1.00)
GFR calc Af Amer: 157 mL/min/1.73
GFR calc non Af Amer: 136 mL/min/1.73
Globulin, Total: 3.9 g/dL (ref 1.5–4.5)
Glucose: 88 mg/dL (ref 65–99)
Potassium: 4.2 mmol/L (ref 3.5–5.2)
Sodium: 137 mmol/L (ref 134–144)
Total Protein: 8 g/dL (ref 6.0–8.5)

## 2018-09-15 ENCOUNTER — Telehealth: Payer: Self-pay | Admitting: *Deleted

## 2018-09-15 NOTE — Telephone Encounter (Signed)
-----  Message from Antony Blackbird, MD sent at 09/13/2018  8:14 PM EST ----- Please notify patient that her complete metabolic panel was normal. CBC indicates that her hemoglobin is now normal at  12.2 (normal 11.1-15.9). Her MCV (cell size) is still low so she should continue a once daily multivitamin with iron.

## 2018-09-15 NOTE — Telephone Encounter (Signed)
Medical Assistant left message on patient's home and cell voicemail. Voicemail states to give a call back to [place] with [facility] at [phone]. Patient is aware of CMP being normal and anemia being stable. Patient aware of continuing multi vitamin with iron due to cell size level being low.

## 2018-11-03 DIAGNOSIS — M329 Systemic lupus erythematosus, unspecified: Secondary | ICD-10-CM | POA: Diagnosis not present

## 2018-11-03 DIAGNOSIS — M609 Myositis, unspecified: Secondary | ICD-10-CM | POA: Diagnosis not present

## 2018-11-03 DIAGNOSIS — Z79899 Other long term (current) drug therapy: Secondary | ICD-10-CM | POA: Diagnosis not present

## 2018-11-06 ENCOUNTER — Encounter (HOSPITAL_COMMUNITY): Payer: Self-pay

## 2018-11-06 ENCOUNTER — Emergency Department (HOSPITAL_COMMUNITY)
Admission: EM | Admit: 2018-11-06 | Discharge: 2018-11-06 | Disposition: A | Discharge status: Home / Self Care | Payer: Medicaid Other | Attending: Emergency Medicine | Admitting: Emergency Medicine

## 2018-11-06 ENCOUNTER — Emergency Department (HOSPITAL_COMMUNITY): Payer: Medicaid Other

## 2018-11-06 ENCOUNTER — Other Ambulatory Visit: Payer: Self-pay

## 2018-11-06 DIAGNOSIS — R42 Dizziness and giddiness: Secondary | ICD-10-CM | POA: Diagnosis not present

## 2018-11-06 DIAGNOSIS — R0789 Other chest pain: Secondary | ICD-10-CM

## 2018-11-06 DIAGNOSIS — R197 Diarrhea, unspecified: Secondary | ICD-10-CM | POA: Diagnosis not present

## 2018-11-06 DIAGNOSIS — Z79899 Other long term (current) drug therapy: Secondary | ICD-10-CM | POA: Insufficient documentation

## 2018-11-06 DIAGNOSIS — R51 Headache: Secondary | ICD-10-CM | POA: Diagnosis not present

## 2018-11-06 DIAGNOSIS — Z7982 Long term (current) use of aspirin: Secondary | ICD-10-CM | POA: Diagnosis not present

## 2018-11-06 DIAGNOSIS — R05 Cough: Secondary | ICD-10-CM | POA: Diagnosis not present

## 2018-11-06 DIAGNOSIS — R0602 Shortness of breath: Secondary | ICD-10-CM | POA: Diagnosis not present

## 2018-11-06 DIAGNOSIS — R079 Chest pain, unspecified: Secondary | ICD-10-CM | POA: Diagnosis not present

## 2018-11-06 DIAGNOSIS — Z7984 Long term (current) use of oral hypoglycemic drugs: Secondary | ICD-10-CM | POA: Diagnosis not present

## 2018-11-06 DIAGNOSIS — R1084 Generalized abdominal pain: Secondary | ICD-10-CM | POA: Diagnosis not present

## 2018-11-06 LAB — CBC WITH DIFFERENTIAL/PLATELET
Abs Immature Granulocytes: 0.06 10*3/uL (ref 0.00–0.07)
Basophils Absolute: 0 10*3/uL (ref 0.0–0.1)
Basophils Relative: 0 %
Eosinophils Absolute: 0.2 10*3/uL (ref 0.0–0.5)
Eosinophils Relative: 1 %
HCT: 39.2 % (ref 36.0–46.0)
Hemoglobin: 12 g/dL (ref 12.0–15.0)
Immature Granulocytes: 0 %
Lymphocytes Relative: 5 %
Lymphs Abs: 1 10*3/uL (ref 0.7–4.0)
MCH: 25.8 pg — ABNORMAL LOW (ref 26.0–34.0)
MCHC: 30.6 g/dL (ref 30.0–36.0)
MCV: 84.3 fL (ref 80.0–100.0)
Monocytes Absolute: 1 10*3/uL (ref 0.1–1.0)
Monocytes Relative: 5 %
Neutro Abs: 16.5 10*3/uL — ABNORMAL HIGH (ref 1.7–7.7)
Neutrophils Relative %: 89 %
Platelets: 296 10*3/uL (ref 150–400)
RBC: 4.65 MIL/uL (ref 3.87–5.11)
RDW: 18.1 % — ABNORMAL HIGH (ref 11.5–15.5)
WBC: 18.7 10*3/uL — ABNORMAL HIGH (ref 4.0–10.5)
nRBC: 0 % (ref 0.0–0.2)

## 2018-11-06 LAB — COMPREHENSIVE METABOLIC PANEL
ALT: 17 U/L (ref 0–44)
AST: 18 U/L (ref 15–41)
Albumin: 3.4 g/dL — ABNORMAL LOW (ref 3.5–5.0)
Alkaline Phosphatase: 54 U/L (ref 38–126)
Anion gap: 6 (ref 5–15)
BUN: 9 mg/dL (ref 6–20)
CO2: 22 mmol/L (ref 22–32)
Calcium: 8.2 mg/dL — ABNORMAL LOW (ref 8.9–10.3)
Chloride: 109 mmol/L (ref 98–111)
Creatinine, Ser: 0.64 mg/dL (ref 0.44–1.00)
GFR calc Af Amer: >60 mL/min (ref >60)
GFR calc non Af Amer: >60 mL/min (ref >60)
Glucose, Bld: 104 mg/dL — ABNORMAL HIGH (ref 70–99)
Potassium: 3.9 mmol/L (ref 3.5–5.1)
Sodium: 137 mmol/L (ref 135–145)
Total Bilirubin: 0.7 mg/dL (ref 0.3–1.2)
Total Protein: 7.8 g/dL (ref 6.5–8.1)

## 2018-11-06 LAB — URINALYSIS, ROUTINE W REFLEX MICROSCOPIC
Bilirubin Urine: NEGATIVE
Glucose, UA: NEGATIVE mg/dL
Ketones, ur: NEGATIVE mg/dL
Nitrite: NEGATIVE
Protein, ur: NEGATIVE mg/dL
Specific Gravity, Urine: 1.014 (ref 1.005–1.030)
pH: 6 (ref 5.0–8.0)

## 2018-11-06 LAB — I-STAT BETA HCG BLOOD, ED (MC, WL, AP ONLY): I-stat hCG, quantitative: <5 m[IU]/mL (ref <5)

## 2018-11-06 LAB — TROPONIN I: Troponin I: <0.03 ng/mL (ref <0.03)

## 2018-11-06 MED ORDER — LOPERAMIDE HCL 2 MG PO CAPS
4.0000 mg | ORAL_CAPSULE | Freq: Once | ORAL | Status: AC
  Administered 2018-11-06: 4 mg via ORAL
  Filled 2018-11-06: qty 2

## 2018-11-06 MED ORDER — SODIUM CHLORIDE 0.9 % IV BOLUS
1000.0000 mL | Freq: Once | INTRAVENOUS | Status: AC
  Administered 2018-11-06: 1000 mL via INTRAVENOUS

## 2018-11-06 NOTE — ED Notes (Signed)
Bed: WA03 Expected date:  Expected time:  Means of arrival:  Comments: EMS 17yo abd pain, dizziness, diarrhea, orthostatic +

## 2018-11-06 NOTE — ED Notes (Signed)
Pt unable to provide urine specimen at this time. Pt states she thinks she will be able to after receiving some more fluid. Will try again in a little while.

## 2018-11-06 NOTE — ED Triage Notes (Addendum)
Pt BIB EMS. Pt from home. Diarrhea x 2days. Pt reported dizziness started today. No fall, LOC. Pt reporting abdominal pain. Pt reports persistent cough since January. Pt reports hx of Lupus and does take meds for. Per EMS orthostatic pressures. Pt received 1 L bolus from EMS Standing 88/40 HR 140 Supine 150/100 HR 120 99% RA CBG 137  Temp 97.5 Resp 15

## 2018-11-07 NOTE — ED Provider Notes (Signed)
[facility] DEPT Provider Note   CSN: [id number] Arrival date & time: 11/06/18  1257    History   Chief Complaint Chief Complaint  Patient presents with  . Diarrhea  . Dizziness  . Abdominal Pain    HPI Rhonda Franco is a 18 y.o. female.   HPI   18 year old female with several complaints, primarily diarrhea.  Onset 2 days ago.  Persistent since then.  Watery without blood.  No fevers.  Some crampy intermittent abdominal pain.  Today she began feeling dizzy.  Worse with movement or even with standing.  She was orthostatic for EMS.  Also reports a consistent cough since January and some chest tightness.  No shortness of breath.  No known COVID contacts or significant recent travel history.  Past Medical History:  Diagnosis Date  . Anemia   . Lupus ([place])   . Prediabetes     Patient Active Problem List   Diagnosis Date Noted  . BMI 40.0-44.9, adult ([place]) 06/18/2018  . Vaginal discharge 06/18/2018  . High risk heterosexual behavior 06/18/2018  . Pericardial effusion 03/05/2017  . Acanthosis nigricans 08/30/2015  . Family history of diabetes mellitus 07/29/2006  . Allergic rhinitis 07/29/2006    History reviewed. No pertinent surgical history.   OB History    Gravida  0   Para  0   Term  0   Preterm  0   AB  0   Living  0     SAB  0   TAB  0   Ectopic  0   Multiple  0   Live Births  0            Home Medications    Prior to Admission medications   Medication Sig Start Date End Date Taking? Authorizing Provider  acetaminophen (TYLENOL) 325 MG tablet Take 650 mg by mouth every 6 (six) hours as needed for mild pain or headache.   Yes [provider]  aspirin EC 81 MG tablet Take 81 mg by mouth daily.   Yes [provider]  famotidine (PEPCID) 20 MG tablet Take 1 tablet (20 mg total) by mouth daily. 06/13/18  Yes Fulp, Cammie, MD  ferrous sulfate 325 (65 FE) MG tablet Take 1 tablet (325 mg total)  by mouth 2 (two) times daily with a meal. 08/21/18  Yes Fulp, Cammie, MD  folic acid (FOLVITE) 1 MG tablet Take 1 mg by mouth daily. 08/27/18  Yes [provider]  furosemide (LASIX) 20 MG tablet Take 20 mg by mouth 2 (two) times daily. 06/04/17  Yes [provider]  guaiFENesin (MUCINEX) 600 MG 12 hr tablet Take 600 mg by mouth 2 (two) times daily as needed for cough.   Yes [provider]  hydroxychloroquine (PLAQUENIL) 200 MG tablet Take 1 tablet (200 mg total) by mouth 2 (two) times daily. 01/18/18  Yes Upstill, Nehemiah Settle, PA-C  metFORMIN (GLUCOPHAGE) 500 MG tablet Take one pill daily after the last meal of the day/evening Patient taking differently: Take 500 mg by mouth daily. Take one pill daily after the last meal of the day/evening 09/12/18  Yes Fulp, Cammie, MD  methotrexate (RHEUMATREX) 2.5 MG tablet Take 15 mg by mouth once a week. 07/30/18  Yes [provider]  MITIGARE 0.6 MG CAPS Take 0.6 mg by mouth 2 (two) times daily. 09/04/18  Yes [provider]  OVER THE COUNTER MEDICATION Take 1 tablet by mouth daily. Takes 2 OTC vitamins (unsure of which  ones)   Yes [provider]  predniSONE (DELTASONE) 10 MG tablet Take 10 mg by mouth daily. 09/04/18  Yes [provider]  clindamycin (CLEOCIN) 300 MG capsule Take 2 pills about 1 hour prior to dental procedures Patient not taking: Reported on 11/06/2018 09/12/18   Antony Blackbird, MD    Family History Family History  Problem Relation Age of Onset  . Diabetes Mother   . Arthritis Mother   . Diabetes Maternal Aunt   . Diabetes Maternal Grandfather   . Heart disease Maternal Grandfather   . Stroke Paternal Grandmother     Social History Social History   Tobacco Use  . Smoking status: Never Smoker  . Smokeless tobacco: Never Used  Substance Use Topics  . Alcohol use: No    Frequency: Never  . Drug use: No     Allergies   Ampicillin; Other; Penicillins; and Bactrim  [sulfamethoxazole-trimethoprim]   Review of Systems Review of Systems  All systems reviewed and negative, other than as noted in HPI. Physical Exam Updated Vital Signs BP 128/83   Pulse 80   Temp 98.8 F (37.1 C) (Oral)   Resp 20   SpO2 95%   Physical Exam Vitals signs and nursing note reviewed.  Constitutional:      General: She is not in acute distress.    Appearance: She is well-developed.  HENT:     Head: Normocephalic and atraumatic.  Eyes:     General:        Right eye: No discharge.        Left eye: No discharge.     Conjunctiva/sclera: Conjunctivae normal.  Neck:     Musculoskeletal: Neck supple.  Cardiovascular:     Rate and Rhythm: Normal rate and regular rhythm.     Heart sounds: Normal heart sounds. No murmur. No friction rub. No gallop.   Pulmonary:     Effort: Pulmonary effort is normal. No respiratory distress.     Breath sounds: Normal breath sounds.  Abdominal:     General: There is no distension.     Palpations: Abdomen is soft.     Tenderness: There is no abdominal tenderness.  Musculoskeletal:        General: No tenderness.  Skin:    General: Skin is warm and dry.  Neurological:     Mental Status: She is alert.  Psychiatric:        Behavior: Behavior normal.        Thought Content: Thought content normal.      ED Treatments / Results  Labs (all labs ordered are listed, but only abnormal results are displayed) Labs Reviewed  CBC WITH DIFFERENTIAL/PLATELET - Abnormal; Notable for the following components:      Result Value   WBC 18.7 (*)    MCH 25.8 (*)    RDW 18.1 (*)    Neutro Abs 16.5 (*)    All other components within normal limits  COMPREHENSIVE METABOLIC PANEL - Abnormal; Notable for the following components:   Glucose, Bld 104 (*)    Calcium 8.2 (*)    Albumin 3.4 (*)    All other components within normal limits  URINALYSIS, ROUTINE W REFLEX MICROSCOPIC - Abnormal; Notable for the following components:   APPearance HAZY  (*)    Hgb urine dipstick MODERATE (*)    Leukocytes,Ua MODERATE (*)    Bacteria, UA RARE (*)    All other components within normal limits  TROPONIN I  I-STAT BETA HCG  BLOOD, ED ([facility], [facility], [facility] ONLY)    EKG EKG Interpretation  Date/Time:  Thursday November 06 2018 13:23:33 EDT Ventricular Rate:  110 PR Interval:    QRS Duration: 82 QT Interval:  339 QTC Calculation: 459 R Axis:   65 Text Interpretation:  Sinus tachycardia Nonspecific T wave abnormality Confirmed by Lajean Saver [phone]) on 11/06/2018 1:26:30 PM Also confirmed by Lajean Saver [phone]), editor Philomena Doheny [phone])  on 11/07/2018 6:58:19 AM   Radiology Dg Chest 2 View  Result Date: 11/06/2018 CLINICAL DATA:  Dizziness, cough and orthostatic hypotension. History of lupus. EXAM: CHEST - 2 VIEW COMPARISON:  05/05/2018, CTA of the chest on 04/23/2018 FINDINGS: The heart size and mediastinal contours are within normal limits. Lung volumes are low bilaterally. There remains suggestion of some subtle chronic airspace opacity in both lungs, especially in the right upper lung zone. No pleural fluid or pneumothorax. The visualized skeletal structures are unremarkable. IMPRESSION: Low lung volumes with suggestion of subtle chronic opacity in both lungs, especially in the right upper lung. Electronically Signed   By: Aletta Edouard M.D.   On: 11/06/2018 14:36    Procedures Procedures (including critical care time)  Medications Ordered in ED Medications  sodium chloride 0.9 % bolus 1,000 mL (0 mLs Intravenous Stopped 11/06/18 1521)  loperamide (IMODIUM) capsule 4 mg (4 mg Oral Given 11/06/18 1345)     Initial Impression / Assessment and Plan / ED Course  I have reviewed the triage vital signs and the nursing notes.  Pertinent labs & imaging results that were available during my care of the patient were reviewed by me and considered in my medical decision making (see chart for details).  17yF with diarrhea and near syncopal symptoms.  Improved after IVF. Abdominal exam benign. Feeling better. Afebrile. No blood in stool. I suspect viral illness.   It has been determined that no acute conditions requiring further emergency intervention are present at this time. The patient has been advised of the diagnosis and plan. I reviewed any labs and imaging including any potential incidental findings. I have reviewed nursing notes and appropriate previous records. We have discussed signs and symptoms that warrant return to the ED and they are listed in the discharge instructions.      Final Clinical Impressions(s) / ED Diagnoses   Final diagnoses:  Diarrhea, unspecified type  Chest tightness    ED Discharge Orders    None       Virgel Manifold, MD 11/07/18 1010

## 2018-12-11 ENCOUNTER — Telehealth: Payer: Medicaid Other

## 2018-12-11 ENCOUNTER — Telehealth: Payer: Medicaid Other | Admitting: Physician Assistant

## 2018-12-11 DIAGNOSIS — K219 Gastro-esophageal reflux disease without esophagitis: Secondary | ICD-10-CM

## 2018-12-11 DIAGNOSIS — R079 Chest pain, unspecified: Secondary | ICD-10-CM

## 2018-12-11 NOTE — Progress Notes (Signed)
Based on what you shared with me, I feel your condition warrants further evaluation and I recommend that you be seen for a face to face office visit.  If you are having chest pain along with heartburn symptoms you need to be seen in person so a good physical examination can be performed to rule out any other significant causes of the pain that just uncomplicated heart burn (gastritis, ulcer, etc).   NOTE: If you entered your credit card information for this eVisit, you will not be charged. You may see a "hold" on your card for the $35 but that hold will drop off and you will not have a charge processed.  If you are having a true medical emergency please call 911.  If you need an urgent face to face visit, [facility] has four urgent care centers for your convenience.    PLEASE NOTE: THE [facility] LOCATIONS AND URGENT CARE CLINICS DO NOT HAVE THE TESTING FOR CORONAVIRUS COVID19 AVAILABLE.  IF YOU FEEL YOU NEED THIS TEST YOU MUST GO TO A TRIAGE LOCATION AT [facility]   [email or web address] to reserve your spot online an avoid wait times  [facility] [address] [place], [place] [address] Modified hours of operation: Monday-Friday, 12 PM to 6 PM  Saturday & Sunday 10 AM to 4 PM *Across the street from [place] (New Address!) [address] [place], [place] [address] *Just off [address], across the road from [place] hours of operation: Monday-Friday, 12 PM to 6 PM  Closed Saturday & Sunday  [facility]'s modified hours of operation will be in effect from May 1 until May 31   The following sites will take your insurance:  . [facility] [place] a Provider at this Location  [address] [place], [place] [address] . 10 am to 8 pm Monday-Friday . 12 pm to 8 pm Saturday-Sunday   . [facility]  at [place] a Provider at this Location  [address], [place], [place] [address] . 8 am to 8 pm Monday-Friday . 9 am to 6 pm Saturday . 11 am to 6 pm Sunday   . [facility] at [place] Get Driving Directions  [address] [place], [place] [address] . 8 am to 8 pm Monday-Friday . 8 am to 4 pm Saturday-Sunday   Your e-visit answers were reviewed by a board certified advanced clinical practitioner to complete your personal care plan.  Thank you for using e-Visits.

## 2018-12-23 ENCOUNTER — Other Ambulatory Visit: Payer: Self-pay | Admitting: Family Medicine

## 2018-12-23 DIAGNOSIS — K219 Gastro-esophageal reflux disease without esophagitis: Secondary | ICD-10-CM

## 2019-01-10 ENCOUNTER — Encounter (HOSPITAL_COMMUNITY): Payer: Self-pay

## 2019-01-10 ENCOUNTER — Other Ambulatory Visit: Payer: Self-pay

## 2019-01-10 ENCOUNTER — Emergency Department (HOSPITAL_COMMUNITY)
Admission: EM | Admit: 2019-01-10 | Discharge: 2019-01-10 | Disposition: A | Discharge status: Home / Self Care | Payer: Medicaid Other | Attending: Emergency Medicine | Admitting: Emergency Medicine

## 2019-01-10 DIAGNOSIS — M545 Low back pain: Secondary | ICD-10-CM | POA: Diagnosis not present

## 2019-01-10 DIAGNOSIS — N390 Urinary tract infection, site not specified: Secondary | ICD-10-CM

## 2019-01-10 DIAGNOSIS — Z7984 Long term (current) use of oral hypoglycemic drugs: Secondary | ICD-10-CM | POA: Insufficient documentation

## 2019-01-10 DIAGNOSIS — Z79899 Other long term (current) drug therapy: Secondary | ICD-10-CM | POA: Diagnosis not present

## 2019-01-10 DIAGNOSIS — R309 Painful micturition, unspecified: Secondary | ICD-10-CM | POA: Diagnosis present

## 2019-01-10 DIAGNOSIS — Z7982 Long term (current) use of aspirin: Secondary | ICD-10-CM | POA: Diagnosis not present

## 2019-01-10 DIAGNOSIS — R7303 Prediabetes: Secondary | ICD-10-CM | POA: Diagnosis not present

## 2019-01-10 LAB — PREGNANCY, URINE: Preg Test, Ur: NEGATIVE

## 2019-01-10 LAB — URINALYSIS, ROUTINE W REFLEX MICROSCOPIC
Bilirubin Urine: NEGATIVE
Glucose, UA: NEGATIVE mg/dL
Hgb urine dipstick: NEGATIVE
Ketones, ur: NEGATIVE mg/dL
Nitrite: POSITIVE — AB
Protein, ur: 100 mg/dL — AB
Specific Gravity, Urine: 1.023 (ref 1.005–1.030)
WBC, UA: >50 WBC/hpf — ABNORMAL HIGH (ref 0–5)
pH: 6 (ref 5.0–8.0)

## 2019-01-10 MED ORDER — CEPHALEXIN 500 MG PO CAPS
500.0000 mg | ORAL_CAPSULE | Freq: Once | ORAL | Status: AC
  Administered 2019-01-10: 500 mg via ORAL
  Filled 2019-01-10: qty 1

## 2019-01-10 MED ORDER — CEPHALEXIN 250 MG PO CAPS
250.0000 mg | ORAL_CAPSULE | Freq: Once | ORAL | Status: AC
  Administered 2019-01-10: 250 mg via ORAL
  Filled 2019-01-10: qty 1

## 2019-01-10 MED ORDER — CEPHALEXIN 500 MG PO CAPS: 500.0000 mg | ORAL_CAPSULE | Freq: Two times a day (BID) | ORAL | 0 refills | Status: AC

## 2019-01-10 NOTE — ED Triage Notes (Signed)
Pt arrived stating that about 6 days ago she started noticing burning when she urinated, pt took over the counter medication with no relief. States that her primary care provider was too full to see her.

## 2019-01-10 NOTE — ED Provider Notes (Signed)
[facility] DEPT Provider Note   CSN: [id number] Arrival date & time: 01/10/19  2115    History   Chief Complaint Chief Complaint  Patient presents with  . Urinary Tract Infection    HPI Rhonda Franco is a 18 y.o. female.     HPI Patient presents to the emergency room for evaluation of urinary discomfort.  Patient states for the last 6 days she has had some burning with urination.  She has noticed some discoloration to her urine.  She tried over-the-counter medications but her symptoms have persisted.  She denies any fevers or chills.  She does have some lower back pain but no flank pain.  She denies any vomiting or diarrhea.  She denies any vaginal discharge or bleeding.  Patient has had bladder infections before and this feels similar. Past Medical History:  Diagnosis Date  . Anemia   . Lupus ([place])   . Prediabetes     Patient Active Problem List   Diagnosis Date Noted  . BMI 40.0-44.9, adult ([place]) 06/18/2018  . Vaginal discharge 06/18/2018  . High risk heterosexual behavior 06/18/2018  . Pericardial effusion 03/05/2017  . Acanthosis nigricans 08/30/2015  . Family history of diabetes mellitus 07/29/2006  . Allergic rhinitis 07/29/2006    History reviewed. No pertinent surgical history.   OB History    Gravida  0   Para  0   Term  0   Preterm  0   AB  0   Living  0     SAB  0   TAB  0   Ectopic  0   Multiple  0   Live Births  0            Home Medications    Prior to Admission medications   Medication Sig Start Date End Date Taking? Authorizing Provider  aspirin EC 81 MG tablet Take 81 mg by mouth daily.   Yes [provider]  famotidine (PEPCID) 20 MG tablet TAKE 1 TABLET(20 MG) BY MOUTH DAILY Patient taking differently: Take 20 mg by mouth daily.  12/24/18  Yes Fulp, Cammie, MD  ferrous sulfate 325 (65 FE) MG tablet Take 1 tablet (325 mg total) by mouth 2 (two) times daily with a meal. 08/21/18   Yes Fulp, Cammie, MD  folic acid (FOLVITE) 1 MG tablet Take 1 mg by mouth daily. 08/27/18  Yes [provider]  furosemide (LASIX) 20 MG tablet Take 20 mg by mouth 2 (two) times daily. 06/04/17  Yes [provider]  hydroxychloroquine (PLAQUENIL) 200 MG tablet Take 1 tablet (200 mg total) by mouth 2 (two) times daily. 01/18/18  Yes Upstill, Nehemiah Settle, PA-C  metFORMIN (GLUCOPHAGE) 500 MG tablet Take one pill daily after the last meal of the day/evening Patient taking differently: Take 500 mg by mouth daily. Take one pill daily after the last meal of the day/evening 09/12/18  Yes Fulp, Cammie, MD  methotrexate (RHEUMATREX) 2.5 MG tablet Take 15 mg by mouth every Tuesday.  07/30/18  Yes [provider]  MITIGARE 0.6 MG CAPS Take 0.6 mg by mouth 2 (two) times daily. 09/04/18  Yes [provider]  predniSONE (DELTASONE) 10 MG tablet Take 10 mg by mouth daily. 09/04/18  Yes [provider]  cephALEXin (KEFLEX) 500 MG capsule Take 1 capsule (500 mg total) by mouth 2 (two) times daily for 3 days. 01/10/19 01/13/19  Dorie Rank, MD  clindamycin (CLEOCIN) 300 MG capsule Take 2 pills about 1 hour  prior to dental procedures Patient not taking: Reported on 11/06/2018 09/12/18   Antony Blackbird, MD    Family History Family History  Problem Relation Age of Onset  . Diabetes Mother   . Arthritis Mother   . Diabetes Maternal Aunt   . Diabetes Maternal Grandfather   . Heart disease Maternal Grandfather   . Stroke Paternal Grandmother     Social History Social History   Tobacco Use  . Smoking status: Never Smoker  . Smokeless tobacco: Never Used  Substance Use Topics  . Alcohol use: No    Frequency: Never  . Drug use: No     Allergies   Ampicillin, Other, Penicillins, and Bactrim [sulfamethoxazole-trimethoprim]   Review of Systems Review of Systems  All other systems reviewed and are negative.    Physical Exam Updated Vital Signs BP (!) 144/105 (BP Location: Left  Wrist)   Pulse (!) 106   Temp 99.5 F (37.5 C) (Oral)   Resp 20   Ht 1.676 m (_0 )   SpO2 99%   BMI 43.74 kg/m   Physical Exam Vitals signs and nursing note reviewed.  Constitutional:      General: She is not in acute distress.    Appearance: She is well-developed.  HENT:     Head: Normocephalic and atraumatic.     Right Ear: External ear normal.     Left Ear: External ear normal.  Eyes:     General: No scleral icterus.       Right eye: No discharge.        Left eye: No discharge.     Conjunctiva/sclera: Conjunctivae normal.  Neck:     Musculoskeletal: Neck supple.     Trachea: No tracheal deviation.  Cardiovascular:     Rate and Rhythm: Normal rate and regular rhythm.  Pulmonary:     Effort: Pulmonary effort is normal. No respiratory distress.     Breath sounds: Normal breath sounds. No stridor. No wheezing or rales.  Abdominal:     General: Bowel sounds are normal. There is no distension.     Palpations: Abdomen is soft.     Tenderness: There is no abdominal tenderness. There is no guarding or rebound.  Musculoskeletal:        General: No tenderness.  Skin:    General: Skin is warm and dry.     Findings: No rash.  Neurological:     Mental Status: She is alert.     Cranial Nerves: No cranial nerve deficit (no facial droop, extraocular movements intact, no slurred speech).     Sensory: No sensory deficit.     Motor: No abnormal muscle tone or seizure activity.     Coordination: Coordination normal.      ED Treatments / Results  Labs (all labs ordered are listed, but only abnormal results are displayed) Labs Reviewed  URINALYSIS, ROUTINE W REFLEX MICROSCOPIC - Abnormal; Notable for the following components:      Result Value   Color, Urine AMBER (*)    APPearance CLOUDY (*)    Protein, ur 100 (*)    Nitrite POSITIVE (*)    Leukocytes,Ua LARGE (*)    WBC, UA >50 (*)    Bacteria, UA RARE (*)    Non Squamous Epithelial 0-5 (*)    All other components  within normal limits  PREGNANCY, URINE    EKG None  Radiology No results found.  Procedures Procedures (including critical care time)  Medications Ordered in ED Medications  cephALEXin (KEFLEX) capsule 250 mg (has no administration in time range)  cephALEXin (KEFLEX) capsule 500 mg (has no administration in time range)     Initial Impression / Assessment and Plan / ED Course  I have reviewed the triage vital signs and the nursing notes.  Pertinent labs & imaging results that were available during my care of the patient were reviewed by me and considered in my medical decision making (see chart for details).   Patient presented to the emergency room for evaluation of a probable urinary tract infection.  Her urinalysis is consistent with that.  Patient is afebrile and nontoxic.  Plan on discharge home with a prescription for Keflex.  Patient states she just gets a rash to penicillin.  She denies any anaphylactic reaction     Final Clinical Impressions(s) / ED Diagnoses   Final diagnoses:  Lower urinary tract infectious disease    ED Discharge Orders         Ordered    cephALEXin (KEFLEX) 500 MG capsule  2 times daily     01/10/19 2259           Dorie Rank, MD 01/10/19 2302

## 2019-01-24 ENCOUNTER — Other Ambulatory Visit: Payer: Self-pay | Admitting: Family Medicine

## 2019-01-24 DIAGNOSIS — D509 Iron deficiency anemia, unspecified: Secondary | ICD-10-CM

## 2019-02-10 DIAGNOSIS — Z79899 Other long term (current) drug therapy: Secondary | ICD-10-CM | POA: Diagnosis not present

## 2019-02-10 DIAGNOSIS — M6080 Other myositis, unspecified site: Secondary | ICD-10-CM | POA: Diagnosis not present

## 2019-02-10 DIAGNOSIS — I313 Pericardial effusion (noninflammatory): Secondary | ICD-10-CM | POA: Diagnosis not present

## 2019-02-10 DIAGNOSIS — M329 Systemic lupus erythematosus, unspecified: Secondary | ICD-10-CM | POA: Diagnosis not present

## 2019-02-10 DIAGNOSIS — R918 Other nonspecific abnormal finding of lung field: Secondary | ICD-10-CM | POA: Diagnosis not present

## 2019-02-10 DIAGNOSIS — R0600 Dyspnea, unspecified: Secondary | ICD-10-CM | POA: Diagnosis not present

## 2019-02-10 DIAGNOSIS — M609 Myositis, unspecified: Secondary | ICD-10-CM | POA: Insufficient documentation

## 2019-02-12 DIAGNOSIS — I071 Rheumatic tricuspid insufficiency: Secondary | ICD-10-CM | POA: Diagnosis not present

## 2019-02-12 DIAGNOSIS — I313 Pericardial effusion (noninflammatory): Secondary | ICD-10-CM | POA: Diagnosis not present

## 2019-02-12 DIAGNOSIS — M329 Systemic lupus erythematosus, unspecified: Secondary | ICD-10-CM | POA: Diagnosis not present

## 2019-02-25 DIAGNOSIS — Z23 Encounter for immunization: Secondary | ICD-10-CM | POA: Diagnosis not present

## 2019-02-25 DIAGNOSIS — G4719 Other hypersomnia: Secondary | ICD-10-CM | POA: Diagnosis not present

## 2019-02-25 DIAGNOSIS — R0683 Snoring: Secondary | ICD-10-CM | POA: Diagnosis not present

## 2019-02-25 DIAGNOSIS — J849 Interstitial pulmonary disease, unspecified: Secondary | ICD-10-CM | POA: Diagnosis not present

## 2019-03-06 DIAGNOSIS — J849 Interstitial pulmonary disease, unspecified: Secondary | ICD-10-CM | POA: Diagnosis not present

## 2019-03-06 DIAGNOSIS — M329 Systemic lupus erythematosus, unspecified: Secondary | ICD-10-CM | POA: Diagnosis not present

## 2019-03-06 DIAGNOSIS — R918 Other nonspecific abnormal finding of lung field: Secondary | ICD-10-CM | POA: Diagnosis not present

## 2019-03-13 ENCOUNTER — Ambulatory Visit: Payer: Medicaid Other

## 2019-03-17 ENCOUNTER — Other Ambulatory Visit: Payer: Self-pay | Admitting: Family Medicine

## 2019-03-17 DIAGNOSIS — K219 Gastro-esophageal reflux disease without esophagitis: Secondary | ICD-10-CM

## 2019-03-25 ENCOUNTER — Ambulatory Visit: Payer: Medicaid Other

## 2019-03-27 ENCOUNTER — Ambulatory Visit: Payer: Medicaid Other | Admitting: Family Medicine

## 2019-05-05 ENCOUNTER — Other Ambulatory Visit: Payer: Self-pay | Admitting: Family Medicine

## 2019-05-05 DIAGNOSIS — K219 Gastro-esophageal reflux disease without esophagitis: Secondary | ICD-10-CM

## 2019-05-06 ENCOUNTER — Ambulatory Visit: Payer: Medicaid Other | Admitting: Family Medicine

## 2019-05-07 ENCOUNTER — Encounter: Payer: Self-pay | Admitting: General Practice

## 2019-05-18 DIAGNOSIS — J849 Interstitial pulmonary disease, unspecified: Secondary | ICD-10-CM | POA: Insufficient documentation

## 2019-05-18 DIAGNOSIS — Z79899 Other long term (current) drug therapy: Secondary | ICD-10-CM | POA: Diagnosis not present

## 2019-05-18 DIAGNOSIS — M6080 Other myositis, unspecified site: Secondary | ICD-10-CM | POA: Diagnosis not present

## 2019-05-18 DIAGNOSIS — M359 Systemic involvement of connective tissue, unspecified: Secondary | ICD-10-CM | POA: Insufficient documentation

## 2019-05-18 DIAGNOSIS — M329 Systemic lupus erythematosus, unspecified: Secondary | ICD-10-CM | POA: Diagnosis not present

## 2019-05-18 DIAGNOSIS — J8489 Other specified interstitial pulmonary diseases: Secondary | ICD-10-CM | POA: Diagnosis not present

## 2019-05-20 ENCOUNTER — Encounter: Payer: Medicaid Other | Admitting: Family Medicine

## 2019-05-27 ENCOUNTER — Telehealth: Payer: Self-pay | Admitting: General Practice

## 2019-05-27 NOTE — Telephone Encounter (Signed)
Left message on VM informing pt that appt has been rescheduled due to provider will not be available that morning on 06/25/2019.  Pt rescheduled to 06/24/2019 at 8:10am.  Asked pt to give our office a call back with any questions or concerns.

## 2019-06-18 ENCOUNTER — Telehealth: Payer: Self-pay | Admitting: Family Medicine

## 2019-06-18 DIAGNOSIS — Z79899 Other long term (current) drug therapy: Secondary | ICD-10-CM | POA: Diagnosis not present

## 2019-06-18 DIAGNOSIS — M3210 Systemic lupus erythematosus, organ or system involvement unspecified: Secondary | ICD-10-CM | POA: Diagnosis not present

## 2019-06-18 NOTE — Telephone Encounter (Signed)
Patient can obtain otc medication for dental pain such as orajel and take otc tylenol or other otc medication for pain and if this is not helping she may want to schedule an office visit or go to urgent care

## 2019-06-18 NOTE — Telephone Encounter (Signed)
Patient called requesting for her PCP to prescribe her and Rx for her mouth pain. Patient states that she was seen by the dentist and the dentist hurt her tooth's when she was having them cleaned. Patient does not see a new dentist until February and would like an Rx for her pain. Please f/u

## 2019-06-19 NOTE — Telephone Encounter (Signed)
Spoke with patient and informed her with what provider stated and she verbalized understanding and agreed with advise.

## 2019-06-24 ENCOUNTER — Ambulatory Visit: Payer: Medicaid Other | Admitting: Advanced Practice Midwife

## 2019-06-25 ENCOUNTER — Ambulatory Visit: Payer: Medicaid Other | Admitting: Obstetrics and Gynecology

## 2019-06-26 DIAGNOSIS — H52203 Unspecified astigmatism, bilateral: Secondary | ICD-10-CM | POA: Diagnosis not present

## 2019-06-26 DIAGNOSIS — H5213 Myopia, bilateral: Secondary | ICD-10-CM | POA: Diagnosis not present

## 2019-06-29 ENCOUNTER — Ambulatory Visit (INDEPENDENT_AMBULATORY_CARE_PROVIDER_SITE_OTHER): Payer: Medicaid Other | Admitting: *Deleted

## 2019-06-29 ENCOUNTER — Other Ambulatory Visit: Payer: Self-pay

## 2019-06-29 ENCOUNTER — Other Ambulatory Visit (HOSPITAL_COMMUNITY)
Admission: RE | Admit: 2019-06-29 | Discharge: 2019-06-29 | Disposition: A | Discharge status: Home / Self Care | Payer: Medicaid Other | Source: Ambulatory Visit | Attending: Obstetrics and Gynecology | Admitting: Obstetrics and Gynecology

## 2019-06-29 VITALS — BP 124/88 | HR 96 | Temp 98.6°F | Ht 66.0 in | Wt 285.0 lb

## 2019-06-29 DIAGNOSIS — N898 Other specified noninflammatory disorders of vagina: Secondary | ICD-10-CM

## 2019-06-29 NOTE — Progress Notes (Signed)
   SUBJECTIVE:  18 y.o. female complains of clear to brown vaginal discharge for 1 week(s). Denies abnormal vaginal bleeding or significant pelvic pain or fever. No UTI symptoms. Denies history of known exposure to STD.  No LMP recorded. Patient has had an implant.  OBJECTIVE:  She appears well, afebrile. Urine dipstick: not done.  ASSESSMENT:  Vaginal Discharge   PLAN:  GC, chlamydia, trichomonas, BVAG, CVAG probe sent to lab. Treatment: To be determined once lab results are received ROV prn if symptoms persist or worsen.  Derl Barrow, RN

## 2019-07-02 ENCOUNTER — Encounter: Payer: Self-pay | Admitting: Family Medicine

## 2019-07-02 ENCOUNTER — Other Ambulatory Visit (INDEPENDENT_AMBULATORY_CARE_PROVIDER_SITE_OTHER): Payer: Medicaid Other | Admitting: Obstetrics and Gynecology

## 2019-07-02 DIAGNOSIS — B3731 Acute candidiasis of vulva and vagina: Secondary | ICD-10-CM

## 2019-07-02 DIAGNOSIS — A749 Chlamydial infection, unspecified: Secondary | ICD-10-CM

## 2019-07-02 DIAGNOSIS — B373 Candidiasis of vulva and vagina: Secondary | ICD-10-CM

## 2019-07-02 DIAGNOSIS — A5901 Trichomonal vulvovaginitis: Secondary | ICD-10-CM

## 2019-07-02 LAB — CERVICOVAGINAL ANCILLARY ONLY
Bacterial Vaginitis (gardnerella): NEGATIVE
Candida Glabrata: POSITIVE — AB
Candida Vaginitis: NEGATIVE
Chlamydia: POSITIVE — AB
Comment: NEGATIVE
Comment: NEGATIVE
Comment: NEGATIVE
Comment: NEGATIVE
Comment: NEGATIVE
Comment: NORMAL
Neisseria Gonorrhea: NEGATIVE
Trichomonas: POSITIVE — AB

## 2019-07-02 NOTE — Progress Notes (Signed)
Fluconazole and Azithromycin Rx NOT sent after consult with pharmacist from main pharmacy at [facility]. Will send a message to patient's PCP to consult on medication interactions.  Laury Deep, CNM

## 2019-07-06 ENCOUNTER — Telehealth: Payer: Self-pay | Admitting: Family Medicine

## 2019-07-06 NOTE — Telephone Encounter (Signed)
Patient has to take an antibiotic and she stated that the OBGYN said it would interferer with her metformin.

## 2019-07-06 NOTE — Telephone Encounter (Signed)
Patient called PCP office about medications to take for her infections. Per patient, she was prescribed medication to treat infection( chlamydia and trich) and yeast by GYN but medications were put on hold while pharmacist consulted with PCP.   Per Rhonda Franco, Clinical  Pharmacist- the medication- fluconazole  may cause prolongation in QT interval but no concerns with Metformin. QT interval on last EKG WNL.   May also try Doxycycline in place Azithromycin. Recommended 7 days BID.  Medication should not interfere with Metformin.

## 2019-07-07 ENCOUNTER — Other Ambulatory Visit: Payer: Self-pay | Admitting: Nurse Practitioner

## 2019-07-07 MED ORDER — DOXYCYCLINE HYCLATE 100 MG PO TABS: 100.0000 mg | ORAL_TABLET | Freq: Two times a day (BID) | ORAL | 0 refills | Status: AC

## 2019-07-07 NOTE — Telephone Encounter (Signed)
She can take the fluconazole. Doxycycline will be sent in place of azithromycin.

## 2019-07-07 NOTE — Telephone Encounter (Signed)
Patient aware of message per Mrs. Raul Del, NP.  Patient is asking if she can pick up other medication. Will route message to prescribing clinician.

## 2019-07-08 ENCOUNTER — Other Ambulatory Visit: Payer: Self-pay | Admitting: Obstetrics and Gynecology

## 2019-07-08 ENCOUNTER — Telehealth: Payer: Self-pay | Admitting: Nurse Practitioner

## 2019-07-08 DIAGNOSIS — B373 Candidiasis of vulva and vagina: Secondary | ICD-10-CM

## 2019-07-08 DIAGNOSIS — B3731 Acute candidiasis of vulva and vagina: Secondary | ICD-10-CM

## 2019-07-08 MED ORDER — FLUCONAZOLE 150 MG PO TABS: 150.0000 mg | ORAL_TABLET | ORAL | 0 refills | Status: DC

## 2019-07-08 NOTE — Telephone Encounter (Signed)
Pt called to speak with the nurse since according to the provider she suppoted to get 3 prescription and she only have one, please follow up

## 2019-07-08 NOTE — Progress Notes (Signed)
Patient ok to take Fluconazole per Geryl Rankins, NP's message.  Laury Deep, CNM

## 2019-07-09 NOTE — Telephone Encounter (Signed)
Spoke to patient. Patient was informed she have Doxycycline and Fluconazole sent to the pharmacy.  Pt. Stated she think her insurance does not cover the Fluconazole. CMA suggest for her to verified with the pharmacy and if is not cover, she will need to reach out to her gynecologist.   Pt. Understood.

## 2019-07-21 DIAGNOSIS — H5213 Myopia, bilateral: Secondary | ICD-10-CM | POA: Diagnosis not present

## 2019-07-22 ENCOUNTER — Ambulatory Visit: Payer: Medicaid Other | Admitting: Certified Nurse Midwife

## 2019-07-27 ENCOUNTER — Ambulatory Visit: Payer: Medicaid Other

## 2019-07-28 ENCOUNTER — Ambulatory Visit (INDEPENDENT_AMBULATORY_CARE_PROVIDER_SITE_OTHER): Payer: Medicaid Other | Admitting: *Deleted

## 2019-07-28 ENCOUNTER — Other Ambulatory Visit (HOSPITAL_COMMUNITY)
Admission: RE | Admit: 2019-07-28 | Discharge: 2019-07-28 | Disposition: A | Discharge status: Home / Self Care | Payer: Medicaid Other | Source: Ambulatory Visit | Attending: Obstetrics and Gynecology | Admitting: Obstetrics and Gynecology

## 2019-07-28 ENCOUNTER — Other Ambulatory Visit: Payer: Self-pay

## 2019-07-28 VITALS — BP 125/71 | HR 111 | Temp 98.0°F | Ht 66.0 in | Wt 280.0 lb

## 2019-07-28 DIAGNOSIS — Z113 Encounter for screening for infections with a predominantly sexual mode of transmission: Secondary | ICD-10-CM

## 2019-07-28 NOTE — Progress Notes (Signed)
   Patient in clinic for test of cure. Patient completed self-swab. Patient declined symptoms today.   Derl Barrow, RN

## 2019-07-29 LAB — CERVICOVAGINAL ANCILLARY ONLY
Bacterial Vaginitis (gardnerella): NEGATIVE
Candida Glabrata: POSITIVE — AB
Candida Vaginitis: NEGATIVE
Chlamydia: NEGATIVE
Comment: NEGATIVE
Comment: NEGATIVE
Comment: NEGATIVE
Comment: NEGATIVE
Comment: NEGATIVE
Comment: NORMAL
Neisseria Gonorrhea: NEGATIVE
Trichomonas: POSITIVE — AB

## 2019-07-30 ENCOUNTER — Telehealth: Payer: Self-pay | Admitting: *Deleted

## 2019-07-30 DIAGNOSIS — M6080 Other myositis, unspecified site: Secondary | ICD-10-CM | POA: Diagnosis not present

## 2019-07-30 DIAGNOSIS — J8489 Other specified interstitial pulmonary diseases: Secondary | ICD-10-CM | POA: Diagnosis not present

## 2019-07-30 DIAGNOSIS — A599 Trichomoniasis, unspecified: Secondary | ICD-10-CM

## 2019-07-30 DIAGNOSIS — M359 Systemic involvement of connective tissue, unspecified: Secondary | ICD-10-CM | POA: Diagnosis not present

## 2019-07-30 DIAGNOSIS — B3731 Acute candidiasis of vulva and vagina: Secondary | ICD-10-CM

## 2019-07-30 DIAGNOSIS — Z79899 Other long term (current) drug therapy: Secondary | ICD-10-CM | POA: Diagnosis not present

## 2019-07-30 DIAGNOSIS — B373 Candidiasis of vulva and vagina: Secondary | ICD-10-CM

## 2019-07-30 DIAGNOSIS — M329 Systemic lupus erythematosus, unspecified: Secondary | ICD-10-CM | POA: Diagnosis not present

## 2019-07-30 MED ORDER — METRONIDAZOLE 500 MG PO TABS: 500.0000 mg | ORAL_TABLET | Freq: Two times a day (BID) | ORAL | 0 refills | Status: DC

## 2019-07-30 MED ORDER — FLUCONAZOLE 150 MG PO TABS: 150.0000 mg | ORAL_TABLET | ORAL | 0 refills | Status: DC

## 2019-07-30 NOTE — Telephone Encounter (Signed)
-----  Message from Laury Deep, [place] sent at 07/29/2019  5:54 PM EST ----- Regarding: Probiotics That's ok. Just advise her to buy some from OTC. The normal dose is 10 billion CFUs (colony forming units). Some products have higher CFUs and that is ok as well. She should just use as directed.  Ro ----- Message ----- From: Derl Barrow, RN Sent: 07/29/2019   3:14 PM EST To: Laury Deep, CNM  Haven't prescribed probiotics before.  ----- Message ----- From: Laury Deep, CNM Sent: 07/29/2019   3:00 PM EST To: Derl Barrow, RN  Please notify the patient that she is NEGATIVE for CT, but still POSITIVE for Trichomonas. Please treat her for (+) Trich then yeast (Diflucan 150 mg po every 3 days for 3 doses). Also prescribe probiotic or have her pick it up OTC.

## 2019-07-31 ENCOUNTER — Other Ambulatory Visit: Payer: Self-pay

## 2019-07-31 ENCOUNTER — Encounter: Payer: Self-pay | Admitting: Internal Medicine

## 2019-07-31 ENCOUNTER — Ambulatory Visit: Payer: Medicaid Other | Attending: Internal Medicine | Admitting: Internal Medicine

## 2019-07-31 DIAGNOSIS — D509 Iron deficiency anemia, unspecified: Secondary | ICD-10-CM

## 2019-07-31 DIAGNOSIS — R7303 Prediabetes: Secondary | ICD-10-CM

## 2019-07-31 DIAGNOSIS — M329 Systemic lupus erythematosus, unspecified: Secondary | ICD-10-CM | POA: Diagnosis not present

## 2019-07-31 DIAGNOSIS — Z6841 Body Mass Index (BMI) 40.0 and over, adult: Secondary | ICD-10-CM | POA: Diagnosis not present

## 2019-07-31 NOTE — Progress Notes (Signed)
Virtual Visit via Telephone Note Due to current restrictions/limitations of in-office visits due to the COVID-19 pandemic, this scheduled clinical appointment was converted to a telehealth visit  I connected with Rhonda Franco on 07/31/19 at 11:23 A.M by telephone and verified that I am speaking with the correct person using two identifiers. I am in my office.  The patient is at home.  Only the patient and myself participated in this encounter.  I discussed the limitations, risks, security and privacy concerns of performing an evaluation and management service by telephone and the availability of in person appointments. I also discussed with the patient that there may be a patient responsible charge related to this service. The patient expressed understanding and agreed to proceed.   History of Present Illness: Pt with hx of SLE, pericardial effusion related to lupus, IDA, preDM, obesity.  Use to see Dr. Chapman Fitch which requested change because she states it was difficult getting an appointment with her and when she sent messages she sometimes did not hear back.  Lupus: saw her rheumatologist, Dr. Reather Laurence, yesterday.  On Folic Acid, Plaquenil, Mitigare, Mycophenolate.  No longer on methotrexate -saw cardiologist,   Dr. Shirlee More, [place] 02/2019. Had echo at that time and was told she did not have any reaccumulation of fluid.  Had pericardial window for fluid drainage done at [facility] 02/2017.  Hx of IDA:  Currently not on iron supplement.  States it was not refilled by [facility] so she was not sure whether she still needed to be on it or not.  Had blood test done by her rheumatologist yesterday.  She has not received the results as yet  PreDM/Obesity:   Last blood test done 10 mths ago revealed she no longer has preDM.  Eating less since being off Prednisone.  Wgh was 296 lbs prior to stopping Prednisone.  Now wgh is 280 lbs.  Walk 2 x a wk around her neighborhood for exercise.  No longer taking Metformin.   She states she was not sure whether she was supposed to continue taking it since lab test that showed that she was no longer in the prediabetes range.  She had sent a message to her PCP inquiring about whether she should still be on it and did not get a reply   Current Outpatient Medications  Medication Instructions  . aspirin EC 81 mg, Oral, Daily  . clindamycin (CLEOCIN) 300 MG capsule Take 2 pills about 1 hour prior to dental procedures  . famotidine (PEPCID) 20 MG tablet TAKE 1 TABLET(20 MG) BY MOUTH DAILY  . FEROSUL 325 (65 Fe) MG tablet TAKE 1 TABLET(325 MG) BY MOUTH TWICE DAILY WITH A MEAL  . fluconazole (DIFLUCAN) 150 mg, Oral, Every 3 DAYS  . folic acid (FOLVITE) 1 mg, Oral, Daily  . furosemide (LASIX) 20 mg, Oral, 2 times daily  . hydroxychloroquine (PLAQUENIL) 200 mg, Oral, 2 times daily  . metFORMIN (GLUCOPHAGE) 500 MG tablet Take one pill daily after the last meal of the day/evening  . methotrexate (RHEUMATREX) 15 mg, Oral, Every Tue  . metroNIDAZOLE (FLAGYL) 500 mg, Oral, 2 times daily  . Mitigare 0.6 mg, Oral, 2 times daily  . predniSONE (DELTASONE) 10 mg, Oral, Daily    Observations/Objective:   Chemistry      Component Value Date/Time   NA 137 11/06/2018 1324   NA 137 09/12/2018 1128   K 3.9 11/06/2018 1324   CL 109 11/06/2018 1324   CO2 22 11/06/2018 1324   BUN 9  11/06/2018 1324   BUN 6 09/12/2018 1128   CREATININE 0.64 11/06/2018 1324      Component Value Date/Time   CALCIUM 8.2 (L) 11/06/2018 1324   ALKPHOS 54 11/06/2018 1324   AST 18 11/06/2018 1324   ALT 17 11/06/2018 1324   BILITOT 0.7 11/06/2018 1324   BILITOT <0.2 09/12/2018 1128     Lab Results  Component Value Date   WBC 18.7 (H) 11/06/2018   HGB 12.0 11/06/2018   HCT 39.2 11/06/2018   MCV 84.3 11/06/2018   PLT 296 11/06/2018     Assessment and Plan: 1. Prediabetes 2. Morbid obesity with BMI of 45.0-49.9, adult ([place]) Commended her on reported weight loss. Dietary counseling given.   Advised to eliminate sugary drinks from the diet, cut back on white carbohydrates.  Encouraged her to increase her walking to 3 times a week.  Her last A1c was within normal range.  On her next visit we can recheck to see where she is at.  She will remain off Metformin for now  3. Lupus ([place]) Followed by rheumatology  4. Microcytic anemia Patient had blood test done yesterday through her rheumatologist.  I have requested that she have them fax me her results.  Fax number provided.   Follow Up Instructions: 2 mths   I discussed the assessment and treatment plan with the patient. The patient was provided an opportunity to ask questions and all were answered. The patient agreed with the plan and demonstrated an understanding of the instructions.   The patient was advised to call back or seek an in-person evaluation if the symptoms worsen or if the condition fails to improve as anticipated.  I provided 18 minutes of non-face-to-face time during this encounter.   Karle Plumber, MD

## 2019-07-31 NOTE — Progress Notes (Signed)
Pt is wanting to know does she need to continue to take the metformin

## 2019-08-13 DIAGNOSIS — H5213 Myopia, bilateral: Secondary | ICD-10-CM | POA: Diagnosis not present

## 2019-08-17 ENCOUNTER — Encounter: Payer: Self-pay | Admitting: Internal Medicine

## 2019-08-17 ENCOUNTER — Other Ambulatory Visit (HOSPITAL_COMMUNITY)
Admission: RE | Admit: 2019-08-17 | Discharge: 2019-08-17 | Disposition: A | Discharge status: Home / Self Care | Payer: Medicaid Other | Source: Ambulatory Visit | Attending: Internal Medicine | Admitting: Internal Medicine

## 2019-08-17 ENCOUNTER — Ambulatory Visit: Payer: Medicaid Other | Attending: Internal Medicine | Admitting: Internal Medicine

## 2019-08-17 ENCOUNTER — Other Ambulatory Visit: Payer: Self-pay

## 2019-08-17 VITALS — BP 118/78 | HR 89 | Temp 97.1°F | Resp 16 | Ht 66.0 in | Wt 277.2 lb

## 2019-08-17 DIAGNOSIS — Z7982 Long term (current) use of aspirin: Secondary | ICD-10-CM | POA: Insufficient documentation

## 2019-08-17 DIAGNOSIS — Z113 Encounter for screening for infections with a predominantly sexual mode of transmission: Secondary | ICD-10-CM | POA: Diagnosis not present

## 2019-08-17 DIAGNOSIS — D229 Melanocytic nevi, unspecified: Secondary | ICD-10-CM | POA: Diagnosis not present

## 2019-08-17 DIAGNOSIS — Z8249 Family history of ischemic heart disease and other diseases of the circulatory system: Secondary | ICD-10-CM | POA: Insufficient documentation

## 2019-08-17 DIAGNOSIS — Z823 Family history of stroke: Secondary | ICD-10-CM | POA: Insufficient documentation

## 2019-08-17 DIAGNOSIS — Z6841 Body Mass Index (BMI) 40.0 and over, adult: Secondary | ICD-10-CM | POA: Insufficient documentation

## 2019-08-17 DIAGNOSIS — Z882 Allergy status to sulfonamides status: Secondary | ICD-10-CM | POA: Insufficient documentation

## 2019-08-17 DIAGNOSIS — D2272 Melanocytic nevi of left lower limb, including hip: Secondary | ICD-10-CM | POA: Insufficient documentation

## 2019-08-17 DIAGNOSIS — M329 Systemic lupus erythematosus, unspecified: Secondary | ICD-10-CM | POA: Diagnosis not present

## 2019-08-17 DIAGNOSIS — Z0000 Encounter for general adult medical examination without abnormal findings: Secondary | ICD-10-CM | POA: Diagnosis present

## 2019-08-17 DIAGNOSIS — Z880 Allergy status to penicillin: Secondary | ICD-10-CM | POA: Diagnosis not present

## 2019-08-17 DIAGNOSIS — Z833 Family history of diabetes mellitus: Secondary | ICD-10-CM | POA: Insufficient documentation

## 2019-08-17 DIAGNOSIS — H6123 Impacted cerumen, bilateral: Secondary | ICD-10-CM | POA: Diagnosis not present

## 2019-08-17 DIAGNOSIS — Z79899 Other long term (current) drug therapy: Secondary | ICD-10-CM | POA: Insufficient documentation

## 2019-08-17 MED ORDER — DEBROX 6.5 % OT SOLN: 5.0000 [drp] | Freq: Two times a day (BID) | OTIC | 0 refills | Status: DC

## 2019-08-17 NOTE — Progress Notes (Signed)
Pt is wanting STD testing

## 2019-08-17 NOTE — Patient Instructions (Signed)
I have sent a referral for you to see the dermatologist for the mole on your left foot.  I have sent the prescription for the wax softener for you to use in your ears for a few days.  You can come back in about 3 to 4 days to have your ears flushed by my medical assistant   Obesity, Adult Obesity is having too much body fat. Being obese means that your weight is more than what is healthy for you. BMI is a number that explains how much body fat you have. If you have a BMI of 30 or more, you are obese. Obesity is often caused by eating or drinking more calories than your body uses. Changing your lifestyle can help you lose weight. Obesity can cause serious health problems, such as:  Stroke.  Coronary artery disease (CAD).  Type 2 diabetes.  Some types of cancer, including cancers of the colon, breast, uterus, and gallbladder.  Osteoarthritis.  High blood pressure (hypertension).  High cholesterol.  Sleep apnea.  Gallbladder stones.  Infertility problems. What are the causes?  Eating meals each day that are high in calories, sugar, and fat.  Being born with genes that may make you more likely to become obese.  Having a medical condition that causes obesity.  Taking certain medicines.  Sitting a lot (having a sedentary lifestyle).  Not getting enough sleep.  Drinking a lot of drinks that have sugar in them. What increases the risk?  Having a family history of obesity.  Being an [place] American woman.  Being a Hispanic man.  Living in an area with limited access to: ? Romilda Garret, recreation centers, or sidewalks. ? Healthy food choices, such as grocery stores and farmers' markets. What are the signs or symptoms? The main sign is having too much body fat. How is this treated?  Treatment for this condition often includes changing your lifestyle. Treatment may include: ? Changing your diet. This may include making a healthy meal plan. ? Exercise. This may include  activity that causes your heart to beat faster (aerobic exercise) and strength training. Work with your doctor to design a program that works for you. ? Medicine to help you lose weight. This may be used if you are not able to lose 1 pound a week after 6 weeks of healthy eating and more exercise. ? Treating conditions that cause the obesity. ? Surgery. Options may include gastric banding and gastric bypass. This may be done if:  Other treatments have not helped to improve your condition.  You have a BMI of 40 or higher.  You have life-threatening health problems related to obesity. Follow these instructions at home: Eating and drinking   Follow advice from your doctor about what to eat and drink. Your doctor may tell you to: ? Limit fast food, sweets, and processed snack foods. ? Choose low-fat options. For example, choose low-fat milk instead of whole milk. ? Eat 5 or more servings of fruits or vegetables each day. ? Eat at home more often. This gives you more control over what you eat. ? Choose healthy foods when you eat out. ? Learn to read food labels. This will help you learn how much food is in 1 serving. ? Keep low-fat snacks available. ? Avoid drinks that have a lot of sugar in them. These include soda, fruit juice, iced tea with sugar, and flavored milk.  Drink enough water to keep your pee (urine) pale yellow.  Do not go on fad  diets. Physical activity  Exercise often, as told by your doctor. Most adults should get up to 150 minutes of moderate-intensity exercise every week.Ask your doctor: ? What types of exercise are safe for you. ? How often you should exercise.  Warm up and stretch before being active.  Do slow stretching after being active (cool down).  Rest between times of being active. Lifestyle  Work with your doctor and a food expert (dietitian) to set a weight-loss goal that is best for you.  Limit your screen time.  Find ways to reward yourself that  do not involve food.  Do not drink alcohol if: ? Your doctor tells you not to drink. ? You are pregnant, may be pregnant, or are planning to become pregnant.  If you drink alcohol: ? Limit how much you use to:  0-1 drink a day for women.  0-2 drinks a day for men. ? Be aware of how much alcohol is in your drink. In the [place] one drink equals one 12 oz bottle of beer (355 mL), one 5 oz glass of wine (148 mL), or one 1 oz glass of hard liquor (44 mL). General instructions  Keep a weight-loss journal. This can help you keep track of: ? The food that you eat. ? How much exercise you get.  Take over-the-counter and prescription medicines only as told by your doctor.  Take vitamins and supplements only as told by your doctor.  Think about joining a support group.  Keep all follow-up visits as told by your doctor. This is important. Contact a doctor if:  You cannot meet your weight loss goal after you have changed your diet and lifestyle for 6 weeks. Get help right away if you:  Are having trouble breathing.  Are having thoughts of harming yourself. Summary  Obesity is having too much body fat.  Being obese means that your weight is more than what is healthy for you.  Work with your doctor to set a weight-loss goal.  Get regular exercise as told by your doctor. This information is not intended to replace advice given to you by your health care provider. Make sure you discuss any questions you have with your health care provider. Document Revised: 02/27/2018 Document Reviewed: 02/27/2018 Elsevier Patient Education  2020 [employer].

## 2019-08-17 NOTE — Progress Notes (Signed)
Patient ID: Rhonda Franco, female    DOB: 24-Dec-2000  MRN: [id number]  CC: Annual Exam   Subjective: Rhonda Franco is a 19 y.o. 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  Pt wanting to be screen for STD.  No vaginal itching or dischg. Would like to be screen for HIV and syphyllis as well Sexually active with one partner.  Has Nexplanon since the beginning of last yr No menses x 11 mths.  However she gets random spotting intermittently over the past 3 months.    Would like to be screen for DM again Eating goes and comes.  Some days appetite down and other days she eats a lot more than she should. Drinks mainly water.  No longer drinks sweet tea and sodas. Eating mainly grilled or baked chicken Still walking 3 x a wk for 30 minutes.    IDA:  No longer taking iron.  Last took it 03/2019 She shows me results of blood test recently done by her rheumatologist from her Mychart account.  H&H were 13/40 with platelet count of 361.  Glucose was 82.  Creatinine 0.57 with normal GFR.  LFTs were normal.  The studies were done 08/09/2019  Past medical, social, family history reviewed and updated Patient Active Problem List   Diagnosis Date Noted  . BMI 40.0-44.9, adult ([place]) 06/18/2018  . Vaginal discharge 06/18/2018  . High risk heterosexual behavior 06/18/2018  . Pericardial effusion 03/05/2017  . Acanthosis nigricans 08/30/2015  . Family history of diabetes mellitus 07/29/2006  . Allergic rhinitis 07/29/2006     Current Outpatient Medications on File Prior to Visit  Medication Sig Dispense Refill  . aspirin EC 81 MG tablet Take 81 mg by mouth daily.    . folic acid (FOLVITE) 1 MG tablet Take 1 mg by mouth daily.    . hydroxychloroquine (PLAQUENIL) 200 MG tablet Take 1 tablet (200 mg total) by mouth 2 (two) times  daily. 60 tablet 0  . MITIGARE 0.6 MG CAPS Take 0.6 mg by mouth 2 (two) times daily.    . mycophenolate (CELLCEPT) 500 MG tablet Take 500 mg by mouth 2 (two) times daily.     No current facility-administered medications on file prior to visit.    Allergies  Allergen Reactions  . Ampicillin Anaphylaxis, Swelling, Palpitations and Rash  . Other Shortness Of Breath  . Penicillins Anaphylaxis, Swelling, Palpitations and Rash    Has patient had a PCN reaction causing immediate rash, facial/tongue/throat swelling, SOB or lightheadedness with hypotension: Yes Has patient had a PCN reaction causing severe rash involving mucus membranes or skin necrosis: Yes Has patient had a PCN reaction that required hospitalization: Yes Has patient had a PCN reaction occurring within the last 10 years: Yes If all of the above answers are "NO", then may proceed with Cephalosporin use.   . Bactrim [Sulfamethoxazole-Trimethoprim] Rash    Social History   Socioeconomic History  . Marital status: Single    Spouse name: Not on file  . Number of children: 0  . Years of education: Not on file  . Highest education level: Not on file  Occupational History  . Occupation: customer service  Tobacco Use  . Smoking status: Never Smoker  . Smokeless tobacco: Never Used  Substance and Sexual Activity  . Alcohol use: No  . Drug  use: No  . Sexual activity: Yes    Birth control/protection: None  Other Topics Concern  . Not on file  Social History Narrative  . Not on file   Social Determinants of Health   Financial Resource Strain:   . Difficulty of Paying Living Expenses: Not on file  Food Insecurity:   . Worried About Charity fundraiser in the Last Year: Not on file  . Ran Out of Food in the Last Year: Not on file  Transportation Needs:   . Lack of Transportation (Medical): Not on file  . Lack of Transportation (Non-Medical): Not on file  Physical Activity:   . Days of Exercise per Week: Not on file    . Minutes of Exercise per Session: Not on file  Stress:   . Feeling of Stress : Not on file  Social Connections:   . Frequency of Communication with Friends and Family: Not on file  . Frequency of Social Gatherings with Friends and Family: Not on file  . Attends Religious Services: Not on file  . Active Member of Clubs or Organizations: Not on file  . Attends Archivist Meetings: Not on file  . Marital Status: Not on file  Intimate Partner Violence:   . Fear of Current or Ex-Partner: Not on file  . Emotionally Abused: Not on file  . Physically Abused: Not on file  . Sexually Abused: Not on file    Family History  Problem Relation Age of Onset  . Diabetes Mother   . Arthritis Mother   . Diabetes Maternal Aunt   . Diabetes Maternal Grandfather   . Heart disease Maternal Grandfather   . Stroke Paternal Grandmother     No past surgical history on file.  ROS: Review of Systems  Constitutional:       See above  HENT: Negative for congestion and hearing loss.        Some decreased hearing which she attributes to wax buildup in both ears.  Eyes:       Had eye exam done several weeks ago.  She has new glasses which she is wearing today.  Respiratory: Negative for cough and shortness of breath.   Cardiovascular: Negative for chest pain.  Genitourinary: Negative for dysuria and vaginal discharge.  Skin:       She has a dark spot on the dorsal surface of the left foot which she has had for several months.  It has not increased in size.  Psychiatric/Behavioral: Negative for dysphoric mood.   Negative except as stated above  PHYSICAL EXAM: BP 118/78   Pulse 89   Temp (!) 97.1 F (36.2 C)   Resp 16   Ht _0  (1.676 m)   Wt 277 lb 3.2 oz (125.7 kg)   SpO2 99%   BMI 44.74 kg/m   Wt Readings from Last 3 Encounters:  08/17/19 277 lb 3.2 oz (125.7 kg)  07/28/19 280 lb (127 kg)  06/29/19 285 lb (129.3 kg) (>99 %, Z= 2.78)*   * Growth percentiles are based on CDC  (Girls, 2-20 Years) data.    Physical Exam  General appearance - alert, well appearing, young African-American female and in no distress Mental status - normal mood, behavior, speech, dress, motor activity, and thought processes Eyes - pupils equal and reactive, extraocular eye movements intact Ears -moderate hard wax buildup in both ears Nose - normal and patent, no erythema, discharge or polyps Mouth - mucous membranes moist, pharynx normal without  lesions Neck - supple, no significant adenopathy, no thyroid enlargement Lymphatics -no cervical or axillary lymphadenopathy Chest - clear to auscultation, no wheezes, rales or rhonchi, symmetric air entry Heart - normal rate, regular rhythm, normal S1, S2, no murmurs, rubs, clicks or gallops Extremities - peripheral pulses normal, no pedal edema, no clubbing or cyanosis Skin -0.5 cm hyperpigmented flat mole like lesion on the dorsal surface of the left midfoot   CMP Latest Ref Rng & Units 11/06/2018 09/12/2018 05/05/2018  Glucose 70 - 99 mg/dL 104(H) 88 76  BUN 6 - 20 mg/dL _0 Creatinine 0.44 - 1.00 mg/dL 0.64 0.55(L) 0.59  Sodium 135 - 145 mmol/L 137 137 136  Potassium 3.5 - 5.1 mmol/L 3.9 4.2 4.2  Chloride 98 - 111 mmol/L 109 101 101  CO2 22 - 32 mmol/L _1 Calcium 8.9 - 10.3 mg/dL 8.2(L) 9.1 8.7  Total Protein 6.5 - 8.1 g/dL 7.8 8.0 8.4  Total Bilirubin 0.3 - 1.2 mg/dL 0.7 <0.2 0.4  Alkaline Phos 38 - 126 U/L 54 62 53  AST 15 - 41 U/L 18 18 40  ALT 0 - 44 U/L _2 Lipid Panel     Component Value Date/Time   CHOL 116 05/05/2018 1031   TRIG 115 (H) 05/05/2018 1031   HDL 24 (L) 05/05/2018 1031   CHOLHDL 4.8 (H) 05/05/2018 1031   LDLCALC 69 05/05/2018 1031    CBC    Component Value Date/Time   WBC 18.7 (H) 11/06/2018 1324   RBC 4.65 11/06/2018 1324   HGB 12.0 11/06/2018 1324   HGB 12.2 09/12/2018 1128   HCT 39.2 11/06/2018 1324   HCT 38.6 09/12/2018 1128   PLT 296 11/06/2018 1324   PLT 405 09/12/2018  1128   MCV 84.3 11/06/2018 1324   MCV 78 (L) 09/12/2018 1128   MCH 25.8 (L) 11/06/2018 1324   MCHC 30.6 11/06/2018 1324   RDW 18.1 (H) 11/06/2018 1324   RDW 17.1 (H) 09/12/2018 1128   LYMPHSABS 1.0 11/06/2018 1324   LYMPHSABS 1.2 09/12/2018 1128   MONOABS 1.0 11/06/2018 1324   EOSABS 0.2 11/06/2018 1324   EOSABS 0.0 09/12/2018 1128   BASOSABS 0.0 11/06/2018 1324   BASOSABS 0.0 09/12/2018 1128    ASSESSMENT AND PLAN: 1. Annual physical exam Patient has had blood test panel done through her rheumatologist which she shared with me today.  She is no longer anemic so I have told her to discontinue the iron which she has been off for several months anyway.  2. Bilateral impacted cerumen Recommended using wax softener over-the-counter for several days then she can return to have my CMA flushed both ears - carbamide peroxide (DEBROX) 6.5 % OTIC solution; Place 5 drops into both ears 2 (two) times daily.  Dispense: 15 mL; Refill: 0  3. Screen for STD (sexually transmitted disease) Not at the age as yet for Pap smear but we will do the STD screening - HIV Antibody (routine testing w rflx) - RPR - Cervicovaginal ancillary only  4. Atypical mole - Ambulatory referral to Dermatology  5. Class 3 severe obesity due to excess calories without serious comorbidity with body mass index (BMI) of 40.0 to 44.9 in adult [facility]) Commended her on weight loss so far.  Discussed and encourage healthy eating habits.  Printed information given.  Encouraged her to try to get up to 150 minutes/week of moderate intensity exercise - Hemoglobin A1c    Patient was given  the opportunity to ask questions.  Patient verbalized understanding of the plan and was able to repeat key elements of the plan.   Orders Placed This Encounter  Procedures  . HIV Antibody (routine testing w rflx)  . RPR  . Hemoglobin A1c  . Ambulatory referral to Dermatology     Requested Prescriptions   Signed Prescriptions Disp  Refills  . carbamide peroxide (DEBROX) 6.5 % OTIC solution 15 mL 0    Sig: Place 5 drops into both ears 2 (two) times daily.    Return in about 4 months (around 12/15/2019).  Karle Plumber, MD, FACP

## 2019-08-18 ENCOUNTER — Telehealth: Payer: Self-pay | Admitting: Internal Medicine

## 2019-08-18 LAB — CERVICOVAGINAL ANCILLARY ONLY
Candida Glabrata: POSITIVE — AB
Candida Vaginitis: NEGATIVE
Chlamydia: POSITIVE — AB
Comment: NEGATIVE
Comment: NEGATIVE
Comment: NEGATIVE
Comment: NEGATIVE
Comment: NORMAL
Neisseria Gonorrhea: NEGATIVE
Trichomonas: NEGATIVE

## 2019-08-18 LAB — RPR: RPR Ser Ql: NONREACTIVE

## 2019-08-18 LAB — HEMOGLOBIN A1C
Est. average glucose Bld gHb Est-mCnc: 111 mg/dL
Hgb A1c MFr Bld: 5.5 % (ref 4.8–5.6)

## 2019-08-18 LAB — HIV ANTIBODY (ROUTINE TESTING W REFLEX): HIV Screen 4th Generation wRfx: NONREACTIVE

## 2019-08-18 NOTE — Telephone Encounter (Signed)
Pt would like a call back with her lab results as soon as possible

## 2019-08-19 ENCOUNTER — Other Ambulatory Visit: Payer: Self-pay | Admitting: Internal Medicine

## 2019-08-19 MED ORDER — FLUCONAZOLE 150 MG PO TABS: 150.0000 mg | ORAL_TABLET | Freq: Once | ORAL | 0 refills | Status: AC

## 2019-08-19 MED ORDER — DOXYCYCLINE HYCLATE 100 MG PO TABS: 100.0000 mg | ORAL_TABLET | Freq: Two times a day (BID) | ORAL | 0 refills | Status: DC

## 2019-08-19 NOTE — Telephone Encounter (Signed)
Returned pt call and pt had questions regarding her self swab I made pt aware that Dr. Wynetta Emery has not seen results yet but once she do she will result them and send her a message through [facility]. Pt states she understands and doesn't have any questions or concerns

## 2019-09-18 ENCOUNTER — Ambulatory Visit: Payer: Medicaid Other | Admitting: Certified Nurse Midwife

## 2019-09-24 ENCOUNTER — Ambulatory Visit: Payer: Medicaid Other | Admitting: Family Medicine

## 2019-10-03 ENCOUNTER — Ambulatory Visit: Payer: Medicaid Other

## 2019-10-28 DIAGNOSIS — Z23 Encounter for immunization: Secondary | ICD-10-CM | POA: Diagnosis not present

## 2019-11-09 ENCOUNTER — Other Ambulatory Visit: Payer: Self-pay | Admitting: Internal Medicine

## 2019-11-11 DIAGNOSIS — J8489 Other specified interstitial pulmonary diseases: Secondary | ICD-10-CM | POA: Diagnosis not present

## 2019-11-11 DIAGNOSIS — M359 Systemic involvement of connective tissue, unspecified: Secondary | ICD-10-CM | POA: Diagnosis not present

## 2019-11-11 DIAGNOSIS — M6080 Other myositis, unspecified site: Secondary | ICD-10-CM | POA: Diagnosis not present

## 2019-11-11 DIAGNOSIS — Z79899 Other long term (current) drug therapy: Secondary | ICD-10-CM | POA: Diagnosis not present

## 2019-11-11 DIAGNOSIS — M329 Systemic lupus erythematosus, unspecified: Secondary | ICD-10-CM | POA: Diagnosis not present

## 2019-11-19 DIAGNOSIS — Z23 Encounter for immunization: Secondary | ICD-10-CM | POA: Diagnosis not present

## 2019-12-04 ENCOUNTER — Ambulatory Visit: Payer: Medicaid Other | Admitting: Student

## 2019-12-22 DIAGNOSIS — N390 Urinary tract infection, site not specified: Secondary | ICD-10-CM | POA: Diagnosis not present

## 2019-12-22 DIAGNOSIS — Z20822 Contact with and (suspected) exposure to covid-19: Secondary | ICD-10-CM | POA: Diagnosis not present

## 2019-12-22 DIAGNOSIS — Z113 Encounter for screening for infections with a predominantly sexual mode of transmission: Secondary | ICD-10-CM | POA: Diagnosis not present

## 2019-12-30 ENCOUNTER — Ambulatory Visit: Payer: Medicaid Other | Admitting: Advanced Practice Midwife

## 2020-01-15 ENCOUNTER — Other Ambulatory Visit (HOSPITAL_COMMUNITY)
Admission: RE | Admit: 2020-01-15 | Discharge: 2020-01-15 | Disposition: A | Discharge status: Home / Self Care | Payer: Medicaid Other | Source: Ambulatory Visit | Attending: Internal Medicine | Admitting: Internal Medicine

## 2020-01-15 ENCOUNTER — Other Ambulatory Visit: Payer: Self-pay

## 2020-01-15 ENCOUNTER — Ambulatory Visit: Payer: Medicaid Other | Attending: Internal Medicine | Admitting: Internal Medicine

## 2020-01-15 ENCOUNTER — Encounter: Payer: Self-pay | Admitting: Internal Medicine

## 2020-01-15 DIAGNOSIS — Z79899 Other long term (current) drug therapy: Secondary | ICD-10-CM

## 2020-01-15 DIAGNOSIS — R05 Cough: Secondary | ICD-10-CM | POA: Diagnosis not present

## 2020-01-15 DIAGNOSIS — Z6841 Body Mass Index (BMI) 40.0 and over, adult: Secondary | ICD-10-CM

## 2020-01-15 DIAGNOSIS — Z8619 Personal history of other infectious and parasitic diseases: Secondary | ICD-10-CM

## 2020-01-15 DIAGNOSIS — M329 Systemic lupus erythematosus, unspecified: Secondary | ICD-10-CM | POA: Diagnosis not present

## 2020-01-15 DIAGNOSIS — E66813 Obesity, class 3: Secondary | ICD-10-CM

## 2020-01-15 DIAGNOSIS — R059 Cough, unspecified: Secondary | ICD-10-CM

## 2020-01-15 DIAGNOSIS — IMO0002 Reserved for concepts with insufficient information to code with codable children: Secondary | ICD-10-CM

## 2020-01-15 MED ORDER — PREDNISONE 10 MG PO TABS: 10.0000 mg | ORAL_TABLET | Freq: Every day | ORAL | 0 refills | Status: DC

## 2020-01-15 NOTE — Progress Notes (Signed)
Virtual Visit via Telephone Note Due to current restrictions/limitations of in-office visits due to the COVID-19 pandemic, this scheduled clinical appointment was converted to a telehealth visit  I connected with Rhonda Franco on 01/15/20 at 8:39 a.m by telephone and verified that I am speaking with the correct person using two identifiers. I am in my office.  The patient is at home.  Only the patient and myself participated in this encounter.  I discussed the limitations, risks, security and privacy concerns of performing an evaluation and management service by telephone and the availability of in person appointments. I also discussed with the patient that there may be a patient responsible charge related to this service. The patient expressed understanding and agreed to proceed.   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  Last seen 08/2019.   Reports increase dry cough with CP x a few mths.  Similar episode in past that was caused by her lupus.  Uses incentive spirometry. She has been vaccinated for COVID-19.  No fever, congestion, changes in taste or smell.  Has had SOB since 2018 when she was first dx with lupus. Her rheumatologist has left so she has appt with one of his former partners 02/18/2020. -reports symptoms of acid reflux intermittently but not an every day thing   -would like to have baseline blood tests done because of her being on CellCept and have not seen her rheumatologist in several months.  Obesity:  Doing okay with eating habits.  Not as hungry during the day.  Walk about 30 mins daily.  Overall not much has changed in wgh.  She is working at a job that allows him to see a nutritionist through [employer].  She requested a referral to that nutritionist.  She would also like to have her cholesterol checked.  Would like to be retested for STI.   She had tested positive for chlamydia on her last visit with [place].  She completed treatment.  No symptoms at this time.  Outpatient Encounter Medications as of 01/15/2020  Medication Sig Note  . aspirin EC 81 MG tablet Take 81 mg by mouth daily.   . folic acid (FOLVITE) 1 MG tablet Take 1 mg by mouth daily.   . hydroxychloroquine (PLAQUENIL) 200 MG tablet Take 1 tablet (200 mg total) by mouth 2 (two) times daily.   . mycophenolate (CELLCEPT) 500 MG tablet Take 500 mg by mouth 2 (two) times daily.   . carbamide peroxide (DEBROX) 6.5 % OTIC solution Place 5 drops into both ears 2 (two) times daily.   Marland Kitchen doxycycline (VIBRA-TABS) 100 MG tablet Take 1 tablet (100 mg total) by mouth 2 (two) times daily.   Marland Kitchen MITIGARE 0.6 MG CAPS Take 0.6 mg by mouth 2 (two) times daily. 11/06/2018: scheduled   No facility-administered encounter medications on file as of 01/15/2020.     Observations/Objective:   Assessment and Plan: 1. Class 3 severe obesity due to excess calories without serious comorbidity with body mass index (BMI) of 40.0 to 44.9 in adult [name]) Encouraged her to continue to try to eat healthy and to continue regular exercise.  Referral placed to the nutritionist - Amb ref to Medical Nutrition Therapy-MNT - Lipid panel; Future  2. History of chlamydia - Cervicovaginal ancillary only; Future  3. Lupus ([name]) She will keep the appointment with her new rheumatologist next month  4.  High risk medication use - CBC - Comprehensive metabolic panel  5. Cough We will try her with 5-day course of prednisone to see if this helps.  She may have a mild pleurisy related to lupus - predniSONE (DELTASONE) 10 MG tablet; Take 1 tablet (10 mg total) by mouth daily with breakfast.  Dispense: 5 tablet; Refill: 0   Follow Up Instructions: 5 months   I discussed the assessment and treatment plan with the patient. The patient was provided an opportunity to ask questions and all were answered. The patient agreed  with the plan and demonstrated an understanding of the instructions.   The patient was advised to call back or seek an in-person evaluation if the symptoms worsen or if the condition fails to improve as anticipated.  I provided 41mnutes of non-face-to-face time during this encounter.   DKarle Plumber MD

## 2020-01-15 NOTE — Addendum Note (Signed)
Addended by: Jackelyn Knife on: 01/15/2020 04:38 PM   Modules accepted: Orders

## 2020-01-16 LAB — CBC
Hematocrit: 40.7 % (ref 34.0–46.6)
Hemoglobin: 13.3 g/dL (ref 11.1–15.9)
MCH: 27.5 pg (ref 26.6–33.0)
MCHC: 32.7 g/dL (ref 31.5–35.7)
MCV: 84 fL (ref 79–97)
Platelets: 332 10*3/uL (ref 150–450)
RBC: 4.83 x10E6/uL (ref 3.77–5.28)
RDW: 13.9 % (ref 11.7–15.4)
WBC: 9.5 10*3/uL (ref 3.4–10.8)

## 2020-01-16 LAB — COMPREHENSIVE METABOLIC PANEL
ALT: 10 IU/L (ref 0–32)
AST: 18 IU/L (ref 0–40)
Albumin/Globulin Ratio: 0.9 — ABNORMAL LOW (ref 1.2–2.2)
Albumin: 3.7 g/dL — ABNORMAL LOW (ref 3.9–5.0)
Alkaline Phosphatase: 83 IU/L (ref 45–106)
BUN/Creatinine Ratio: 8 — ABNORMAL LOW (ref 9–23)
BUN: 5 mg/dL — ABNORMAL LOW (ref 6–20)
Bilirubin Total: <0.2 mg/dL (ref 0.0–1.2)
CO2: 24 mmol/L (ref 20–29)
Calcium: 8.8 mg/dL (ref 8.7–10.2)
Chloride: 101 mmol/L (ref 96–106)
Creatinine, Ser: 0.59 mg/dL (ref 0.57–1.00)
GFR calc Af Amer: 153 mL/min/{1.73_m2} (ref >59)
GFR calc non Af Amer: 132 mL/min/{1.73_m2} (ref >59)
Globulin, Total: 4.1 g/dL (ref 1.5–4.5)
Glucose: 120 mg/dL — ABNORMAL HIGH (ref 65–99)
Potassium: 4.4 mmol/L (ref 3.5–5.2)
Sodium: 138 mmol/L (ref 134–144)
Total Protein: 7.8 g/dL (ref 6.0–8.5)

## 2020-01-18 ENCOUNTER — Other Ambulatory Visit: Payer: Self-pay

## 2020-01-18 DIAGNOSIS — Z6841 Body Mass Index (BMI) 40.0 and over, adult: Secondary | ICD-10-CM

## 2020-01-18 LAB — CERVICOVAGINAL ANCILLARY ONLY
Chlamydia: NEGATIVE
Comment: NEGATIVE
Comment: NEGATIVE
Comment: NORMAL
Neisseria Gonorrhea: NEGATIVE
Trichomonas: NEGATIVE

## 2020-02-19 DIAGNOSIS — M359 Systemic involvement of connective tissue, unspecified: Secondary | ICD-10-CM | POA: Diagnosis not present

## 2020-02-19 DIAGNOSIS — M6080 Other myositis, unspecified site: Secondary | ICD-10-CM | POA: Diagnosis not present

## 2020-02-19 DIAGNOSIS — Z79899 Other long term (current) drug therapy: Secondary | ICD-10-CM | POA: Diagnosis not present

## 2020-02-19 DIAGNOSIS — M329 Systemic lupus erythematosus, unspecified: Secondary | ICD-10-CM | POA: Diagnosis not present

## 2020-02-19 DIAGNOSIS — J8489 Other specified interstitial pulmonary diseases: Secondary | ICD-10-CM | POA: Diagnosis not present

## 2020-03-24 DIAGNOSIS — R8290 Unspecified abnormal findings in urine: Secondary | ICD-10-CM | POA: Diagnosis not present

## 2020-03-24 DIAGNOSIS — M329 Systemic lupus erythematosus, unspecified: Secondary | ICD-10-CM | POA: Diagnosis not present

## 2020-04-27 ENCOUNTER — Ambulatory Visit (INDEPENDENT_AMBULATORY_CARE_PROVIDER_SITE_OTHER): Payer: Medicaid Other | Admitting: Certified Nurse Midwife

## 2020-04-27 ENCOUNTER — Encounter: Payer: Self-pay | Admitting: General Practice

## 2020-04-27 ENCOUNTER — Other Ambulatory Visit: Payer: Self-pay

## 2020-04-27 ENCOUNTER — Encounter: Payer: Self-pay | Admitting: Certified Nurse Midwife

## 2020-04-27 VITALS — BP 115/76 | HR 115 | Temp 98.9°F | Ht 66.0 in | Wt 227.6 lb

## 2020-04-27 DIAGNOSIS — Z30011 Encounter for initial prescription of contraceptive pills: Secondary | ICD-10-CM

## 2020-04-27 DIAGNOSIS — R109 Unspecified abdominal pain: Secondary | ICD-10-CM | POA: Diagnosis not present

## 2020-04-27 DIAGNOSIS — Z3046 Encounter for surveillance of implantable subdermal contraceptive: Secondary | ICD-10-CM

## 2020-04-27 DIAGNOSIS — Z01419 Encounter for gynecological examination (general) (routine) without abnormal findings: Secondary | ICD-10-CM

## 2020-04-27 MED ORDER — SLYND 4 MG PO TABS: 1.0000 | ORAL_TABLET | Freq: Every day | ORAL | 3 refills | Status: DC

## 2020-04-27 MED ORDER — SLYND 4 MG PO TABS: 1.0000 | ORAL_TABLET | Freq: Every day | ORAL | 0 refills | Status: DC

## 2020-04-27 NOTE — Patient Instructions (Signed)
Drospirenone tablets (contraception) What is this medicine? DROSPIRENONE (dro SPY re nown) is an oral contraceptive (birth control pill). The product contains a female hormone known as a progestin. It is used to prevent pregnancy. This medicine may be used for other purposes; ask your health care provider or pharmacist if you have questions. COMMON BRAND NAME(S): Slynd What should I tell my health care provider before I take this medicine? They need to know if you have any of these conditions:  abnormal vaginal bleeding  adrenal gland disease  blood vessel disease or blood clots  breast, cervical, endometrial, ovarian, liver, or uterine cancer  diabetes  heart disease or recent heart attack  high potassium level  kidney disease  liver disease  mental depression  migraine headaches  stroke  an unusual or allergic reaction to drospirenone, progestins, or other medicines, foods, dyes, or preservatives  pregnant or trying to get pregnant  breast-feeding How should I use this medicine? Take this medicine by mouth. To reduce nausea, this medicine may be taken with food. Follow the directions on the prescription label. Take this medicine at the same time each day and in the order directed on the package. Do not take your medicine more often than directed. A patient package insert for the product will be given with each prescription and refill. Read this sheet carefully each time. The sheet may change frequently. Talk to your pediatrician regarding the use of this medicine in children. Special care may be needed. This medicine has been used in female children who have started having menstrual periods. Overdosage: If you think you have taken too much of this medicine contact a poison control center or emergency room at once. NOTE: This medicine is only for you. Do not share this medicine with others. What if I miss a dose? If you miss a dose, take it as soon as you can and refer to  the patient information sheet you received with your medicine for direction. If you miss more than one pill, this medicine may not be as effective and you may need to use another form of birth control. What may interact with this medicine? Do not take this medicine with any of the following medications:  atazanavir; cobicistat  bosentan  fosamprenavir This medicine may also interact with the following medications:  aprepitant  barbiturates like phenobarbital, primidone  carbamazepine  certain antibiotics like clarithromycin, rifampin, rifabutin, rifapentine  certain antivirals for HIV or hepatitis  certain diuretics like amiloride, spironolactone, triamterene  certain medicines for fungal infections like griseofulvin, ketoconazole, itraconazole, voriconazole  certain medicines for blood pressure, heart disease  cyclosporine  felbamate  heparin  medicines for diabetes  modafinil  NSAIDs, medicines for pain and inflammation, like ibuprofen or naproxen  oxcarbazepine  phenytoin  potassium supplements  rufinamide  St. John's wort  topiramate This list may not describe all possible interactions. Give your health care provider a list of all the medicines, herbs, non-prescription drugs, or dietary supplements you use. Also tell them if you smoke, drink alcohol, or use illegal drugs. Some items may interact with your medicine. What should I watch for while using this medicine? Visit your doctor or health care professional for regular checks on your progress. You will need a regular breast and pelvic exam and Pap smear while on this medicine. You may need blood work done while you are taking this medicine. If you have any reason to think you are pregnant, stop taking this medicine right away and contact your doctor  or health care professional. This medicine does not protect you against HIV infection (AIDS) or any other sexually transmitted diseases. If you are going to  have elective surgery, you may need to stop taking this medicine before the surgery. Consult your health care professional for advice. What side effects may I notice from receiving this medicine? Side effects that you should report to your doctor or health care professional as soon as possible:  allergic reactions like skin rash, itching or hives, swelling of the face, lips, or tongue  breast tissue changes or discharge  depressed mood  severe pain, swelling, or tenderness in the abdomen  signs and symptoms of a blood clot such as chest pain; shortness of breath; pain, swelling, or warmth in the leg  signs and symptoms of increased potassium like muscle weakness; chest pain; or fast, irregular heartbeat  signs and symptoms of liver injury like dark yellow or brown urine; general ill feeling or flu-like symptoms; light-colored stools; loss of appetite; nausea; right upper belly pain; unusually weak or tired; yellowing of the eyes or skin  signs and symptoms of a stroke like changes in vision; confusion; trouble speaking or understanding; severe headaches; sudden numbness or weakness of the face, arm or leg; trouble walking; dizziness; loss of balance or coordination  unusual vaginal bleeding  unusually weak or tired Side effects that usually do not require medical attention (report these to your doctor or health care professional if they continue or are bothersome):  acne  breast tenderness  headache  menstrual cramps  nausea  weight gain This list may not describe all possible side effects. Call your doctor for medical advice about side effects. You may report side effects to FDA at 1-800-FDA-1088. Where should I keep my medicine? Keep out of the reach of children. Store at room temperature between 20 and 25 degrees C (68 and 77 degrees F). Throw away any unused medicine after the expiration date. NOTE: This sheet is a summary. It may not cover all possible information. If you  have questions about this medicine, talk to your doctor, pharmacist, or health care provider.  2020 Elsevier/Gold Standard (2017-12-04 15:01:56)

## 2020-04-27 NOTE — Addendum Note (Signed)
Addended by: Derl Barrow on: 04/27/2020 11:30 AM   Modules accepted: Orders

## 2020-04-27 NOTE — Progress Notes (Signed)
GYNECOLOGY ANNUAL PREVENTATIVE CARE ENCOUNTER NOTE  History:     Rhonda Franco is a 19 y.o. G0P0000 female here for a routine annual gynecologic exam.  Current complaints: wants nexplanon removed.   Denies abnormal vaginal bleeding, discharge, pelvic pain, problems with intercourse or other gynecologic concerns.    Gynecologic History Patient's last menstrual period was 04/10/2020. Contraception: Nexplanon  Obstetric History OB History  Gravida Para Term Preterm AB Living  0 0 0 0 0 0  SAB TAB Ectopic Multiple Live Births  0 0 0 0 0    Past Medical History:  Diagnosis Date  . Anemia   . Lupus ([place])   . Prediabetes     History reviewed. No pertinent surgical history.  Current Outpatient Medications on File Prior to Visit  Medication Sig Dispense Refill  . aspirin EC 81 MG tablet Take 81 mg by mouth daily.    . folic acid (FOLVITE) 1 MG tablet Take 1 mg by mouth daily.    . hydroxychloroquine (PLAQUENIL) 200 MG tablet Take 1 tablet (200 mg total) by mouth 2 (two) times daily. 60 tablet 0  . mycophenolate (CELLCEPT) 500 MG tablet Take 500 mg by mouth 2 (two) times daily.    . carbamide peroxide (DEBROX) 6.5 % OTIC solution Place 5 drops into both ears 2 (two) times daily. 15 mL 0  . doxycycline (VIBRA-TABS) 100 MG tablet Take 1 tablet (100 mg total) by mouth 2 (two) times daily. 14 tablet 0  . MITIGARE 0.6 MG CAPS Take 0.6 mg by mouth 2 (two) times daily. (Patient not taking: Reported on 04/27/2020)    . predniSONE (DELTASONE) 10 MG tablet Take 1 tablet (10 mg total) by mouth daily with breakfast. 5 tablet 0   No current facility-administered medications on file prior to visit.    Allergies  Allergen Reactions  . Ampicillin Anaphylaxis, Swelling, Palpitations and Rash  . Other Shortness Of Breath  . Penicillins Anaphylaxis, Swelling, Palpitations and Rash    Has patient had a PCN reaction causing immediate rash, facial/tongue/throat swelling, SOB or lightheadedness  with hypotension: Yes Has patient had a PCN reaction causing severe rash involving mucus membranes or skin necrosis: Yes Has patient had a PCN reaction that required hospitalization: Yes Has patient had a PCN reaction occurring within the last 10 years: Yes If all of the above answers are "NO", then may proceed with Cephalosporin use.   . Bactrim [Sulfamethoxazole-Trimethoprim] Rash    Social History:  reports that she has never smoked. She has never used smokeless tobacco. She reports that she does not drink alcohol and does not use drugs.  Family History  Problem Relation Age of Onset  . Diabetes Mother   . Arthritis Mother   . Diabetes Maternal Aunt   . Diabetes Maternal Grandfather   . Heart disease Maternal Grandfather   . Stroke Paternal Grandmother     The following portions of the patient's history were reviewed and updated as appropriate: allergies, current medications, past family history, past medical history, past social history, past surgical history and problem list.  Review of Systems Pertinent items noted in HPI and remainder of comprehensive ROS otherwise negative.  Physical Exam:  BP 115/76 (BP Location: Left Arm, Patient Position: Sitting, Cuff Size: Large)   Pulse (!) 115   Temp 98.9 F (37.2 C) (Oral)   Ht _0  (1.676 m)   Wt 227 lb 9.6 oz (103.2 kg)   LMP 04/10/2020   BMI 36.74 kg/m  CONSTITUTIONAL: Well-developed, well-nourished female in no acute distress.  HENT:  Normocephalic, atraumatic, External right and left ear normal. Oropharynx is clear and moist EYES: Conjunctivae and EOM are normal. Pupils are equal, round, and reactive to light. NECK: Normal range of motion, supple, no masses.  Normal thyroid.  SKIN: Skin is warm and dry. No rash noted. Not diaphoretic. No erythema. No pallor. MUSCULOSKELETAL: Normal range of motion. No tenderness.  No cyanosis, clubbing, or edema.  2+ distal pulses. NEUROLOGIC: Alert and oriented to person, place, and  time. Normal reflexes, muscle tone coordination.  PSYCHIATRIC: Normal mood and affect. Normal behavior. Normal judgment and thought content. CARDIOVASCULAR: Normal heart rate noted, regular rhythm RESPIRATORY: Clear to auscultation bilaterally. Effort and breath sounds normal, no problems with respiration noted. ABDOMEN: Soft, no distention noted.  No rebound or guarding. Right flank tenderness with palpation.      PROCEDURES:  Nexplanon Removal Patient was given informed consent for removal of her Nexplanon.  Appropriate time out taken. Nexplanon site identified.  Area prepped in usual sterile fashon. One ml of 1% lidocaine was used to anesthetize the area at the distal end of the implant. A small stab incision was made right beside the implant on the distal portion.  The Nexplanon rod was grasped using hemostats and removed without difficulty.  There was minimal blood loss. There were no complications.  A small amount of antibiotic ointment and steri-strips were applied over the small incision.  A pressure bandage was applied to reduce any bruising.  The patient tolerated the procedure well and was given post procedure instructions.  Patient is planning to use OCPs for contraception.  Assessment and Plan:    1. Encounter for Nexplanon removal - see above procedure note   2. Encounter for initial prescription of contraceptive pills - Educated and discussed birth control option with patient in detail, patient wants birth control pill  - Discussed with patient side effects and risks with OCPs, discussed COC vs POP- patient prefers POP as patient has lupus and wants to stay away from estrogen.  - Educated on POP not having extended coverage so it is important for patient to take pills on time and daily. - Drospirenone (SLYND) 4 MG TABS; Take 1 tablet by mouth daily.  Dispense: 90 tablet; Refill: 3   3. Flank pain - right flank pain with palpation, denies urinary symptoms  - patient has lupus,  will obtain urine culture  - Urine Culture  4. Women's annual routine gynecological examination - normal well woman examination    Routine preventative health maintenance measures emphasized. Please refer to After Visit Summary for other counseling recommendations.      Lajean Manes, [place] for [employer], [place]'

## 2020-04-29 LAB — URINE CULTURE

## 2020-05-03 ENCOUNTER — Encounter: Payer: Self-pay | Admitting: General Practice

## 2020-05-22 ENCOUNTER — Ambulatory Visit (HOSPITAL_COMMUNITY): Payer: Self-pay

## 2020-06-16 ENCOUNTER — Ambulatory Visit: Payer: Medicaid Other | Attending: Internal Medicine | Admitting: Internal Medicine

## 2020-06-16 ENCOUNTER — Encounter: Payer: Self-pay | Admitting: Internal Medicine

## 2020-06-16 ENCOUNTER — Other Ambulatory Visit: Payer: Self-pay

## 2020-06-16 VITALS — BP 124/86 | HR 95 | Wt 272.8 lb

## 2020-06-16 DIAGNOSIS — Z9229 Personal history of other drug therapy: Secondary | ICD-10-CM | POA: Diagnosis not present

## 2020-06-16 DIAGNOSIS — R030 Elevated blood-pressure reading, without diagnosis of hypertension: Secondary | ICD-10-CM

## 2020-06-16 DIAGNOSIS — Z6841 Body Mass Index (BMI) 40.0 and over, adult: Secondary | ICD-10-CM | POA: Diagnosis not present

## 2020-06-16 DIAGNOSIS — M329 Systemic lupus erythematosus, unspecified: Secondary | ICD-10-CM

## 2020-06-16 DIAGNOSIS — Z23 Encounter for immunization: Secondary | ICD-10-CM

## 2020-06-16 DIAGNOSIS — Z3009 Encounter for other general counseling and advice on contraception: Secondary | ICD-10-CM

## 2020-06-16 NOTE — Patient Instructions (Signed)
We have referred you to our medical weight management program.  You will be called with an appointment.  Please speak with your gynecologist to see if she would recommend a different birth control pill for you   DASH Eating Plan DASH stands for "Dietary Approaches to Stop Hypertension." The DASH eating plan is a healthy eating plan that has been shown to reduce high blood pressure (hypertension). It may also reduce your risk for type 2 diabetes, heart disease, and stroke. The DASH eating plan may also help with weight loss. What are tips for following this plan?  General guidelines  Avoid eating more than 2,300 mg (milligrams) of salt (sodium) a day. If you have hypertension, you may need to reduce your sodium intake to 1,500 mg a day.  Limit alcohol intake to no more than 1 drink a day for nonpregnant women and 2 drinks a day for men. One drink equals 12 oz of beer, 5 oz of wine, or 1 oz of hard liquor.  Work with your health care provider to maintain a healthy body weight or to lose weight. Ask what an ideal weight is for you.  Get at least 30 minutes of exercise that causes your heart to beat faster (aerobic exercise) most days of the week. Activities may include walking, swimming, or biking.  Work with your health care provider or diet and nutrition specialist (dietitian) to adjust your eating plan to your individual calorie needs. Reading food labels   Check food labels for the amount of sodium per serving. Choose foods with less than 5 percent of the Daily Value of sodium. Generally, foods with less than 300 mg of sodium per serving fit into this eating plan.  To find whole grains, look for the word "whole" as the first word in the ingredient list. Shopping  Buy products labeled as "low-sodium" or "no salt added."  Buy fresh foods. Avoid canned foods and premade or frozen meals. Cooking  Avoid adding salt when cooking. Use salt-free seasonings or herbs instead of table salt or  sea salt. Check with your health care provider or pharmacist before using salt substitutes.  Do not fry foods. Cook foods using healthy methods such as baking, boiling, grilling, and broiling instead.  Cook with heart-healthy oils, such as olive, canola, soybean, or sunflower oil. Meal planning  Eat a balanced diet that includes: ? 5 or more servings of fruits and vegetables each day. At each meal, try to fill half of your plate with fruits and vegetables. ? Up to 6-8 servings of whole grains each day. ? Less than 6 oz of lean meat, poultry, or fish each day. A 3-oz serving of meat is about the same size as a deck of cards. One egg equals 1 oz. ? 2 servings of low-fat dairy each day. ? A serving of nuts, seeds, or beans 5 times each week. ? Heart-healthy fats. Healthy fats called Omega-3 fatty acids are found in foods such as flaxseeds and coldwater fish, like sardines, salmon, and mackerel.  Limit how much you eat of the following: ? Canned or prepackaged foods. ? Food that is high in trans fat, such as fried foods. ? Food that is high in saturated fat, such as fatty meat. ? Sweets, desserts, sugary drinks, and other foods with added sugar. ? Full-fat dairy products.  Do not salt foods before eating.  Try to eat at least 2 vegetarian meals each week.  Eat more home-cooked food and less restaurant, buffet, and fast  food.  When eating at a restaurant, ask that your food be prepared with less salt or no salt, if possible. What foods are recommended? The items listed may not be a complete list. Talk with your dietitian about what dietary choices are best for you. Grains Whole-grain or whole-wheat bread. Whole-grain or whole-wheat pasta. Brown rice. [place]. Bulgur. Whole-grain and low-sodium cereals. Pita bread. Low-fat, low-sodium crackers. Whole-wheat flour tortillas. Vegetables Fresh or frozen vegetables (raw, steamed, roasted, or grilled). Low-sodium or reduced-sodium  tomato and vegetable juice. Low-sodium or reduced-sodium tomato sauce and tomato paste. Low-sodium or reduced-sodium canned vegetables. Fruits All fresh, dried, or frozen fruit. Canned fruit in natural juice (without added sugar). Meat and other protein foods Skinless chicken or [place]. Ground chicken or [place]. Pork with fat trimmed off. Fish and seafood. Egg whites. Dried beans, peas, or lentils. Unsalted nuts, nut butters, and seeds. Unsalted canned beans. Lean cuts of beef with fat trimmed off. Low-sodium, lean deli meat. Dairy Low-fat (1%) or fat-free (skim) milk. Fat-free, low-fat, or reduced-fat cheeses. Nonfat, low-sodium ricotta or cottage cheese. Low-fat or nonfat yogurt. Low-fat, low-sodium cheese. Fats and oils Soft margarine without trans fats. Vegetable oil. Low-fat, reduced-fat, or light mayonnaise and salad dressings (reduced-sodium). Canola, safflower, olive, soybean, and sunflower oils. Avocado. Seasoning and other foods Herbs. Spices. Seasoning mixes without salt. Unsalted popcorn and pretzels. Fat-free sweets. What foods are not recommended? The items listed may not be a complete list. Talk with your dietitian about what dietary choices are best for you. Grains Baked goods made with fat, such as croissants, muffins, or some breads. Dry pasta or rice meal packs. Vegetables Creamed or fried vegetables. Vegetables in a cheese sauce. Regular canned vegetables (not low-sodium or reduced-sodium). Regular canned tomato sauce and paste (not low-sodium or reduced-sodium). Regular tomato and vegetable juice (not low-sodium or reduced-sodium). Angie Fava. Olives. Fruits Canned fruit in a light or heavy syrup. Fried fruit. Fruit in cream or butter sauce. Meat and other protein foods Fatty cuts of meat. Ribs. Fried meat. Berniece Salines. Sausage. Bologna and other processed lunch meats. Salami. Fatback. Hotdogs. Bratwurst. Salted nuts and seeds. Canned beans with added salt. Canned or smoked fish.  Whole eggs or egg yolks. Chicken or [place] with skin. Dairy Whole or 2% milk, cream, and half-and-half. Whole or full-fat cream cheese. Whole-fat or sweetened yogurt. Full-fat cheese. Nondairy creamers. Whipped toppings. Processed cheese and cheese spreads. Fats and oils Butter. Stick margarine. Lard. Shortening. Ghee. Bacon fat. Tropical oils, such as coconut, palm kernel, or palm oil. Seasoning and other foods Salted popcorn and pretzels. Onion salt, garlic salt, seasoned salt, table salt, and sea salt. Worcestershire sauce. Tartar sauce. Barbecue sauce. Teriyaki sauce. Soy sauce, including reduced-sodium. Steak sauce. Canned and packaged gravies. Fish sauce. Oyster sauce. Cocktail sauce. Horseradish that you find on the shelf. Ketchup. Mustard. Meat flavorings and tenderizers. Bouillon cubes. Hot sauce and Tabasco sauce. Premade or packaged marinades. Premade or packaged taco seasonings. Relishes. Regular salad dressings. Where to find more information:  National Heart, Lung, and [place]: https://wilson-eaton.com/  American Heart Association: www.heart.org Summary  The DASH eating plan is a healthy eating plan that has been shown to reduce high blood pressure (hypertension). It may also reduce your risk for type 2 diabetes, heart disease, and stroke.  With the DASH eating plan, you should limit salt (sodium) intake to 2,300 mg a day. If you have hypertension, you may need to reduce your sodium intake to 1,500 mg a day.  When on the DASH  eating plan, aim to eat more fresh fruits and vegetables, whole grains, lean proteins, low-fat dairy, and heart-healthy fats.  Work with your health care provider or diet and nutrition specialist (dietitian) to adjust your eating plan to your individual calorie needs. This information is not intended to replace advice given to you by your health care provider. Make sure you discuss any questions you have with your health care provider. Document Revised:  06/07/2017 Document Reviewed: 06/18/2016 Elsevier Patient Education  2020 [employer].

## 2020-06-16 NOTE — Progress Notes (Signed)
Pt had Implanon removed 10/20 experiencing mild cramping heavy flow No current birth control Discuss weight

## 2020-06-16 NOTE — Progress Notes (Signed)
Patient ID: Rhonda Franco, female    DOB: [birth year]  MRN: [id number]  CC: Follow-up   Subjective: Rhonda Franco is a 19 y.o. female who presents for chronic ds management Her concerns today include:  Pt with hx of SLE, pericardial effusion related to lupus(status post pericardial window 2018 at [facility]), IDA, preDM(resolved on labs 2020), obesity.  SLE: new rheumatologist at [place] through [place], [facility].  Had blood test done on first visit in 02/2020.  Told blood tests were nl.  Obesity: discussed on last visit -walking 3 x a wk for 1 hr at gym for past 3 mths Did not get to see nutritionist because insurance did not cover.   Feels she needs more guidance.  Eating more fruits and veggies, less white carbs but loves red skin potatoes.  Drinks mainly water and green tea.  Sweet tea was a problem  Had Nexpalnon removed from [place] 04/27/20 after having it for 1 yr.  It was causing arm pain.  Prescribed Drospirenone oral instead by her GYN but insurance does not cover and cost over $100 even with good Rx.  Menses were irregular while she was on the Nexplanon.  Currently on menses.  Prior to this she had not had a cycle for about 60 days.Marland Kitchen   HM: She reports that she has completed her COVID-19 vaccine series.  She will bring a copy for our records. Patient Active Problem List   Diagnosis Date Noted  . Class 3 severe obesity due to excess calories without serious comorbidity with body mass index (BMI) of 40.0 to 44.9 in adult ([place]) 08/17/2019  . BMI 40.0-44.9, adult ([place]) 06/18/2018  . Vaginal discharge 06/18/2018  . High risk heterosexual behavior 06/18/2018  . Pericardial effusion 03/05/2017  . Acanthosis nigricans 08/30/2015  . Family history of diabetes mellitus 07/29/2006  . Allergic rhinitis 07/29/2006     Current Outpatient Medications on File Prior to Visit  Medication Sig Dispense Refill  . aspirin EC 81 MG tablet Take 81 mg by mouth daily.    .  folic acid (FOLVITE) 1 MG tablet Take 1 mg by mouth daily.    . hydroxychloroquine (PLAQUENIL) 200 MG tablet Take 1 tablet (200 mg total) by mouth 2 (two) times daily. 60 tablet 0  . MITIGARE 0.6 MG CAPS Take 0.6 mg by mouth 2 (two) times daily.    . mycophenolate (CELLCEPT) 500 MG tablet Take 500 mg by mouth 2 (two) times daily.    . Drospirenone (SLYND) 4 MG TABS Take 1 tablet by mouth daily. 168 tablet 0   No current facility-administered medications on file prior to visit.    Allergies  Allergen Reactions  . Ampicillin Anaphylaxis, Swelling, Palpitations and Rash  . Other Shortness Of Breath  . Penicillins Anaphylaxis, Swelling, Palpitations and Rash    Has patient had a PCN reaction causing immediate rash, facial/tongue/throat swelling, SOB or lightheadedness with hypotension: Yes Has patient had a PCN reaction causing severe rash involving mucus membranes or skin necrosis: Yes Has patient had a PCN reaction that required hospitalization: Yes Has patient had a PCN reaction occurring within the last 10 years: Yes If all of the above answers are "NO", then may proceed with Cephalosporin use.   . Bactrim [Sulfamethoxazole-Trimethoprim] Rash    Social History   Socioeconomic History  . Marital status: Single    Spouse name: Not on file  . Number of children: 0  . Years of education: Not on file  . Highest  education level: Not on file  Occupational History  . Occupation: customer service  Tobacco Use  . Smoking status: Never Smoker  . Smokeless tobacco: Never Used  Vaping Use  . Vaping Use: Never used  Substance and Sexual Activity  . Alcohol use: No  . Drug use: No  . Sexual activity: Yes    Birth control/protection: Implant    Comment: Nexplanon removal 04/27/20  Other Topics Concern  . Not on file  Social History Narrative  . Not on file   Social Determinants of Health   Financial Resource Strain: Not on file  Food Insecurity: Not on file  Transportation Needs:  Not on file  Physical Activity: Not on file  Stress: Not on file  Social Connections: Not on file  Intimate Partner Violence: Not on file    Family History  Problem Relation Age of Onset  . Diabetes Mother   . Arthritis Mother   . Diabetes Maternal Aunt   . Diabetes Maternal Grandfather   . Heart disease Maternal Grandfather   . Stroke Paternal Grandmother     No past surgical history on file.  ROS: Review of Systems Negative except as stated above  PHYSICAL EXAM: BP 124/86   Pulse 95   Wt 272 lb 12.8 oz (123.7 kg)   SpO2 99%   BMI 44.03 kg/m   Wt Readings from Last 3 Encounters:  06/16/20 272 lb 12.8 oz (123.7 kg)  04/27/20 227 lb 9.6 oz (103.2 kg)  08/17/19 277 lb 3.2 oz (125.7 kg)  124/86  Physical Exam  General appearance - alert, well appearing, young African-American female and in no distress Mental status - normal mood, behavior, speech, dress, motor activity, and thought processes Neck - supple, no significant adenopathy Chest - clear to auscultation, no wheezes, rales or rhonchi, symmetric air entry Heart - normal rate, regular rhythm, normal S1, S2, no murmurs, rubs, clicks or gallops Extremities - peripheral pulses normal, no pedal edema, no clubbing or cyanosis   CMP Latest Ref Rng & Units 01/15/2020 11/06/2018 09/12/2018  Glucose 65 - 99 mg/dL 120(H) 104(H) 88  BUN 6 - 20 mg/dL 5(L) 9 6  Creatinine 0.57 - 1.00 mg/dL 0.59 0.64 0.55(L)  Sodium 134 - 144 mmol/L 138 137 137  Potassium 3.5 - 5.2 mmol/L 4.4 3.9 4.2  Chloride 96 - 106 mmol/L 101 109 101  CO2 20 - 29 mmol/L _0 Calcium 8.7 - 10.2 mg/dL 8.8 8.2(L) 9.1  Total Protein 6.0 - 8.5 g/dL 7.8 7.8 8.0  Total Bilirubin 0.0 - 1.2 mg/dL <0.2 0.7 <0.2  Alkaline Phos 45 - 106 IU/L 83 54 62  AST 0 - 40 IU/L _1 ALT 0 - 32 IU/L _2 Lipid Panel     Component Value Date/Time   CHOL 116 05/05/2018 1031   TRIG 115 (H) 05/05/2018 1031   HDL 24 (L) 05/05/2018 1031   CHOLHDL 4.8 (H)  05/05/2018 1031   LDLCALC 69 05/05/2018 1031    CBC    Component Value Date/Time   WBC 9.5 01/15/2020 1422   WBC 18.7 (H) 11/06/2018 1324   RBC 4.83 01/15/2020 1422   RBC 4.65 11/06/2018 1324   HGB 13.3 01/15/2020 1422   HCT 40.7 01/15/2020 1422   PLT 332 01/15/2020 1422   MCV 84 01/15/2020 1422   MCH 27.5 01/15/2020 1422   MCH 25.8 (L) 11/06/2018 1324   MCHC 32.7 01/15/2020 1422   MCHC 30.6 11/06/2018  1324   RDW 13.9 01/15/2020 1422   LYMPHSABS 1.0 11/06/2018 1324   LYMPHSABS 1.2 09/12/2018 1128   MONOABS 1.0 11/06/2018 1324   EOSABS 0.2 11/06/2018 1324   EOSABS 0.0 09/12/2018 1128   BASOSABS 0.0 11/06/2018 1324   BASOSABS 0.0 09/12/2018 1128    ASSESSMENT AND PLAN: 1. Class 3 severe obesity due to excess calories without serious comorbidity with body mass index (BMI) of 40.0 to 44.9 in adult [facility]) Discussed healthy eating habits. Advised to eliminate sugary drinks from the diet, cut back on portion sizes of white carbohydrates, incorporate fresh fruits and vegetables into the diet and try to eat more lean white meat instead of red meat. Commended her on her exercise routine and encouraged her to keep up the good works. -We discussed resources available to help with weight management including referral to our medical weight management program. Also discussed weight reduction surgery but patient does not want to consider that at this time. She is agreeable to referral to medical weight management. - Amb Ref to Medical Weight Management  2. Family planning Not able to afford the progestin only birth control pills prescribed by her gynecologist. Advised that she speak with her gynecologist and let her know so that all the options can be discussed and advised  3. SLE (systemic lupus erythematosus related syndrome) ([place]) Plugged in with rheumatology care.  4. Elevated blood pressure reading without diagnosis of hypertension Blood pressure elevated today. Discussed and encourage  DASH diet. Plan to recheck blood pressure on follow-up visit  5. Need for influenza vaccination Given today.  6. COVID-19 vaccine series completed She will bring a copy for our records     Patient was given the opportunity to ask questions.  Patient verbalized understanding of the plan and was able to repeat key elements of the plan.   Orders Placed This Encounter  Procedures  . Amb Ref to Medical Weight Management     Requested Prescriptions    No prescriptions requested or ordered in this encounter    No follow-ups on file.  Karle Plumber, MD, FACP

## 2020-06-17 DIAGNOSIS — M359 Systemic involvement of connective tissue, unspecified: Secondary | ICD-10-CM | POA: Diagnosis not present

## 2020-06-17 DIAGNOSIS — M329 Systemic lupus erythematosus, unspecified: Secondary | ICD-10-CM | POA: Diagnosis not present

## 2020-06-17 DIAGNOSIS — J8489 Other specified interstitial pulmonary diseases: Secondary | ICD-10-CM | POA: Diagnosis not present

## 2020-06-17 DIAGNOSIS — Z79899 Other long term (current) drug therapy: Secondary | ICD-10-CM | POA: Diagnosis not present

## 2020-06-17 DIAGNOSIS — M6080 Other myositis, unspecified site: Secondary | ICD-10-CM | POA: Diagnosis not present

## 2020-06-17 LAB — LIPID PANEL
Chol/HDL Ratio: 4.7 ratio — ABNORMAL HIGH (ref 0.0–4.4)
Cholesterol, Total: 161 mg/dL (ref 100–199)
HDL: 34 mg/dL — ABNORMAL LOW (ref >39)
LDL Chol Calc (NIH): 108 mg/dL — ABNORMAL HIGH (ref 0–99)
Triglycerides: 100 mg/dL (ref 0–149)
VLDL Cholesterol Cal: 19 mg/dL (ref 5–40)

## 2020-07-21 ENCOUNTER — Other Ambulatory Visit: Payer: Medicaid Other

## 2020-07-21 DIAGNOSIS — H52203 Unspecified astigmatism, bilateral: Secondary | ICD-10-CM | POA: Diagnosis not present

## 2020-07-21 DIAGNOSIS — H5213 Myopia, bilateral: Secondary | ICD-10-CM | POA: Diagnosis not present

## 2020-07-21 DIAGNOSIS — Z79899 Other long term (current) drug therapy: Secondary | ICD-10-CM | POA: Diagnosis not present

## 2020-07-21 DIAGNOSIS — M3210 Systemic lupus erythematosus, organ or system involvement unspecified: Secondary | ICD-10-CM | POA: Diagnosis not present

## 2020-07-22 ENCOUNTER — Other Ambulatory Visit: Payer: Medicaid Other

## 2020-08-07 DIAGNOSIS — J069 Acute upper respiratory infection, unspecified: Secondary | ICD-10-CM | POA: Diagnosis not present

## 2020-08-07 DIAGNOSIS — Z20822 Contact with and (suspected) exposure to covid-19: Secondary | ICD-10-CM | POA: Diagnosis not present

## 2020-08-07 DIAGNOSIS — R059 Cough, unspecified: Secondary | ICD-10-CM | POA: Diagnosis not present

## 2020-08-15 DIAGNOSIS — R0981 Nasal congestion: Secondary | ICD-10-CM | POA: Diagnosis not present

## 2020-08-15 DIAGNOSIS — U071 COVID-19: Secondary | ICD-10-CM | POA: Diagnosis not present

## 2020-08-15 DIAGNOSIS — Z9189 Other specified personal risk factors, not elsewhere classified: Secondary | ICD-10-CM | POA: Diagnosis not present

## 2020-08-23 ENCOUNTER — Telehealth: Payer: Self-pay | Admitting: Internal Medicine

## 2020-08-23 NOTE — Telephone Encounter (Signed)
Copied from [place] [id number]. Topic: General - Inquiry >> Aug 22, 2020  5:17 PM Greggory Keen D wrote: Reason for CRM: Pt called asking if she can get a copy of her vaccine record.  She wants to know if she can get a tetanus shot at the office also  CB#  [phone]   Is patient due for tetanus shot? If so I will reach out and schedule.

## 2020-08-24 NOTE — Telephone Encounter (Signed)
Returned pt call and pt states she is just needing immunization record. Pt asked if it can be emailed made pt aware that it can't she may be able to pull it up on her mychart. Guided pt to pull immunization summary in mychart. Pt states she was able to see it and her job may be giving her the tdap. Pt states she will call back if she needs to get it here.

## 2020-09-02 ENCOUNTER — Encounter (INDEPENDENT_AMBULATORY_CARE_PROVIDER_SITE_OTHER): Payer: Self-pay

## 2020-09-02 DIAGNOSIS — Z79899 Other long term (current) drug therapy: Secondary | ICD-10-CM | POA: Diagnosis not present

## 2020-09-02 DIAGNOSIS — M329 Systemic lupus erythematosus, unspecified: Secondary | ICD-10-CM | POA: Diagnosis not present

## 2020-09-12 ENCOUNTER — Encounter (INDEPENDENT_AMBULATORY_CARE_PROVIDER_SITE_OTHER): Payer: Self-pay

## 2020-09-13 DIAGNOSIS — Z23 Encounter for immunization: Secondary | ICD-10-CM | POA: Diagnosis not present

## 2020-09-15 DIAGNOSIS — R0789 Other chest pain: Secondary | ICD-10-CM | POA: Insufficient documentation

## 2020-09-15 DIAGNOSIS — M329 Systemic lupus erythematosus, unspecified: Secondary | ICD-10-CM | POA: Diagnosis not present

## 2020-09-15 DIAGNOSIS — R002 Palpitations: Secondary | ICD-10-CM | POA: Diagnosis not present

## 2020-09-16 DIAGNOSIS — G4719 Other hypersomnia: Secondary | ICD-10-CM | POA: Diagnosis not present

## 2020-09-16 DIAGNOSIS — J849 Interstitial pulmonary disease, unspecified: Secondary | ICD-10-CM | POA: Diagnosis not present

## 2020-09-16 DIAGNOSIS — R0683 Snoring: Secondary | ICD-10-CM | POA: Diagnosis not present

## 2020-09-20 DIAGNOSIS — J984 Other disorders of lung: Secondary | ICD-10-CM | POA: Diagnosis not present

## 2020-09-20 DIAGNOSIS — R590 Localized enlarged lymph nodes: Secondary | ICD-10-CM | POA: Diagnosis not present

## 2020-09-20 DIAGNOSIS — J849 Interstitial pulmonary disease, unspecified: Secondary | ICD-10-CM | POA: Diagnosis not present

## 2020-09-20 DIAGNOSIS — R0600 Dyspnea, unspecified: Secondary | ICD-10-CM | POA: Diagnosis not present

## 2020-09-27 DIAGNOSIS — J8489 Other specified interstitial pulmonary diseases: Secondary | ICD-10-CM | POA: Diagnosis not present

## 2020-09-27 DIAGNOSIS — M329 Systemic lupus erythematosus, unspecified: Secondary | ICD-10-CM | POA: Diagnosis not present

## 2020-09-27 DIAGNOSIS — M359 Systemic involvement of connective tissue, unspecified: Secondary | ICD-10-CM | POA: Diagnosis not present

## 2020-09-27 DIAGNOSIS — J849 Interstitial pulmonary disease, unspecified: Secondary | ICD-10-CM | POA: Diagnosis not present

## 2020-10-17 ENCOUNTER — Ambulatory Visit: Payer: Medicaid Other | Admitting: Internal Medicine

## 2020-10-20 DIAGNOSIS — M6080 Other myositis, unspecified site: Secondary | ICD-10-CM | POA: Diagnosis not present

## 2020-10-20 DIAGNOSIS — J8489 Other specified interstitial pulmonary diseases: Secondary | ICD-10-CM | POA: Diagnosis not present

## 2020-10-20 DIAGNOSIS — M329 Systemic lupus erythematosus, unspecified: Secondary | ICD-10-CM | POA: Diagnosis not present

## 2020-10-20 DIAGNOSIS — M359 Systemic involvement of connective tissue, unspecified: Secondary | ICD-10-CM | POA: Diagnosis not present

## 2020-11-04 DIAGNOSIS — M359 Systemic involvement of connective tissue, unspecified: Secondary | ICD-10-CM | POA: Diagnosis not present

## 2020-11-04 DIAGNOSIS — M3213 Lung involvement in systemic lupus erythematosus: Secondary | ICD-10-CM | POA: Diagnosis not present

## 2020-11-04 DIAGNOSIS — J8489 Other specified interstitial pulmonary diseases: Secondary | ICD-10-CM | POA: Diagnosis not present

## 2020-11-04 DIAGNOSIS — R0602 Shortness of breath: Secondary | ICD-10-CM | POA: Diagnosis not present

## 2020-11-04 DIAGNOSIS — R0902 Hypoxemia: Secondary | ICD-10-CM | POA: Diagnosis not present

## 2020-11-07 DIAGNOSIS — M3213 Lung involvement in systemic lupus erythematosus: Secondary | ICD-10-CM | POA: Diagnosis not present

## 2020-11-07 DIAGNOSIS — R0902 Hypoxemia: Secondary | ICD-10-CM | POA: Diagnosis not present

## 2020-11-10 ENCOUNTER — Encounter (HOSPITAL_COMMUNITY): Payer: Self-pay | Admitting: *Deleted

## 2020-11-10 NOTE — Progress Notes (Signed)
Received referral from Dr. Maceo Pro at [facility] for this pt to participate in pulmonary rehab with the the diagnosis of Lung disease with systemic lupus erythematosus and exercise hypoxemia. Clinical review of pt follow up appt on 11/04/20 Pulmonary office note as well as her PCP  and Rheumatology visit in 122021.  Pt with Covid Risk Score - 2 Pt appropriate for scheduling for Pulmonary rehab.  Will forward to support staff for scheduling and verification of insurance eligibility/benefits with pt consent. Cherre Huger, BSN Cardiac and Training and development officer

## 2020-11-23 DIAGNOSIS — Z79899 Other long term (current) drug therapy: Secondary | ICD-10-CM | POA: Diagnosis not present

## 2020-11-23 DIAGNOSIS — J849 Interstitial pulmonary disease, unspecified: Secondary | ICD-10-CM | POA: Diagnosis not present

## 2020-11-23 DIAGNOSIS — R0602 Shortness of breath: Secondary | ICD-10-CM | POA: Diagnosis not present

## 2020-11-23 DIAGNOSIS — J8489 Other specified interstitial pulmonary diseases: Secondary | ICD-10-CM | POA: Diagnosis not present

## 2020-11-23 DIAGNOSIS — M3213 Lung involvement in systemic lupus erythematosus: Secondary | ICD-10-CM | POA: Diagnosis not present

## 2020-11-24 ENCOUNTER — Telehealth (HOSPITAL_COMMUNITY): Payer: Self-pay | Admitting: Internal Medicine

## 2020-11-28 DIAGNOSIS — M359 Systemic involvement of connective tissue, unspecified: Secondary | ICD-10-CM | POA: Diagnosis not present

## 2020-11-28 DIAGNOSIS — Z3009 Encounter for other general counseling and advice on contraception: Secondary | ICD-10-CM | POA: Diagnosis not present

## 2020-11-28 DIAGNOSIS — Z113 Encounter for screening for infections with a predominantly sexual mode of transmission: Secondary | ICD-10-CM | POA: Diagnosis not present

## 2020-11-28 DIAGNOSIS — R87612 Low grade squamous intraepithelial lesion on cytologic smear of cervix (LGSIL): Secondary | ICD-10-CM | POA: Diagnosis not present

## 2020-11-28 DIAGNOSIS — J8489 Other specified interstitial pulmonary diseases: Secondary | ICD-10-CM | POA: Diagnosis not present

## 2020-11-28 DIAGNOSIS — M3213 Lung involvement in systemic lupus erythematosus: Secondary | ICD-10-CM | POA: Diagnosis not present

## 2020-11-28 DIAGNOSIS — Z3202 Encounter for pregnancy test, result negative: Secondary | ICD-10-CM | POA: Diagnosis not present

## 2020-11-28 DIAGNOSIS — R7303 Prediabetes: Secondary | ICD-10-CM | POA: Diagnosis not present

## 2020-11-28 DIAGNOSIS — Z01419 Encounter for gynecological examination (general) (routine) without abnormal findings: Secondary | ICD-10-CM | POA: Diagnosis not present

## 2020-11-29 ENCOUNTER — Telehealth (HOSPITAL_COMMUNITY): Payer: Self-pay

## 2020-11-29 NOTE — Telephone Encounter (Signed)
Pt insurance is active and benefits verified through [facility] Medicaid Co-pay 0, DED 0/0 met, out of pocket 0/0 met, co-insurance 0%. no pre-authorization required. CorinC 11/29/2020_0 :04pm, REF# [id number] 15 visits per 6 months for pulmonary rehab.

## 2020-12-01 DIAGNOSIS — Z20822 Contact with and (suspected) exposure to covid-19: Secondary | ICD-10-CM | POA: Diagnosis not present

## 2020-12-01 DIAGNOSIS — Z13220 Encounter for screening for lipoid disorders: Secondary | ICD-10-CM | POA: Diagnosis not present

## 2020-12-01 DIAGNOSIS — Z0000 Encounter for general adult medical examination without abnormal findings: Secondary | ICD-10-CM | POA: Diagnosis not present

## 2020-12-01 DIAGNOSIS — E559 Vitamin D deficiency, unspecified: Secondary | ICD-10-CM | POA: Diagnosis not present

## 2020-12-01 DIAGNOSIS — R5383 Other fatigue: Secondary | ICD-10-CM | POA: Diagnosis not present

## 2020-12-01 DIAGNOSIS — H6123 Impacted cerumen, bilateral: Secondary | ICD-10-CM | POA: Diagnosis not present

## 2020-12-01 DIAGNOSIS — R0602 Shortness of breath: Secondary | ICD-10-CM | POA: Diagnosis not present

## 2020-12-03 DIAGNOSIS — E782 Mixed hyperlipidemia: Secondary | ICD-10-CM | POA: Diagnosis not present

## 2020-12-03 DIAGNOSIS — R0602 Shortness of breath: Secondary | ICD-10-CM | POA: Diagnosis not present

## 2020-12-03 DIAGNOSIS — R7303 Prediabetes: Secondary | ICD-10-CM | POA: Diagnosis not present

## 2020-12-03 DIAGNOSIS — R635 Abnormal weight gain: Secondary | ICD-10-CM | POA: Diagnosis not present

## 2020-12-03 DIAGNOSIS — F5081 Binge eating disorder: Secondary | ICD-10-CM | POA: Diagnosis not present

## 2020-12-08 DIAGNOSIS — R0902 Hypoxemia: Secondary | ICD-10-CM | POA: Diagnosis not present

## 2020-12-08 DIAGNOSIS — M3213 Lung involvement in systemic lupus erythematosus: Secondary | ICD-10-CM | POA: Diagnosis not present

## 2020-12-11 DIAGNOSIS — F4323 Adjustment disorder with mixed anxiety and depressed mood: Secondary | ICD-10-CM | POA: Diagnosis not present

## 2020-12-14 DIAGNOSIS — Z23 Encounter for immunization: Secondary | ICD-10-CM | POA: Diagnosis not present

## 2020-12-14 DIAGNOSIS — Z609 Problem related to social environment, unspecified: Secondary | ICD-10-CM | POA: Diagnosis not present

## 2020-12-14 DIAGNOSIS — M3213 Lung involvement in systemic lupus erythematosus: Secondary | ICD-10-CM | POA: Diagnosis not present

## 2020-12-14 DIAGNOSIS — Z79899 Other long term (current) drug therapy: Secondary | ICD-10-CM | POA: Diagnosis not present

## 2020-12-16 DIAGNOSIS — M3213 Lung involvement in systemic lupus erythematosus: Secondary | ICD-10-CM | POA: Insufficient documentation

## 2020-12-19 ENCOUNTER — Ambulatory Visit (HOSPITAL_COMMUNITY): Payer: Medicaid Other

## 2020-12-19 NOTE — Telephone Encounter (Signed)
I called pt to see if she would like to reschedule her pulmonary rehab, pt stated she tried to call and cancel her pulmonary rehab orientation but the call kept going to VM I advised that when we go on lunch the phones go straight to VM. Pt understood and stated that she would like to reschedule her pulmonary rehab. I advised pt that we are scheduling out right now until July and that we would give her a call back once that July schedule is complete. Pt understood.

## 2020-12-23 ENCOUNTER — Ambulatory Visit: Payer: Medicaid Other | Admitting: Internal Medicine

## 2020-12-27 ENCOUNTER — Ambulatory Visit (HOSPITAL_COMMUNITY): Payer: Medicaid Other

## 2020-12-29 ENCOUNTER — Ambulatory Visit: Payer: Medicaid Other | Admitting: Obstetrics and Gynecology

## 2020-12-29 ENCOUNTER — Ambulatory Visit (HOSPITAL_COMMUNITY): Payer: Medicaid Other

## 2021-01-02 DIAGNOSIS — Z3202 Encounter for pregnancy test, result negative: Secondary | ICD-10-CM | POA: Diagnosis not present

## 2021-01-02 DIAGNOSIS — R87612 Low grade squamous intraepithelial lesion on cytologic smear of cervix (LGSIL): Secondary | ICD-10-CM | POA: Diagnosis not present

## 2021-01-02 DIAGNOSIS — R350 Frequency of micturition: Secondary | ICD-10-CM | POA: Diagnosis not present

## 2021-01-03 ENCOUNTER — Ambulatory Visit (HOSPITAL_COMMUNITY): Payer: Medicaid Other

## 2021-01-04 ENCOUNTER — Telehealth (HOSPITAL_COMMUNITY): Payer: Self-pay

## 2021-01-04 NOTE — Telephone Encounter (Signed)
Mailed pt pulmonary rehab letter off back in May 2022 and the letter came back returned to sender. Updated pt address in system that matches her ID that we have on file.

## 2021-01-05 ENCOUNTER — Ambulatory Visit (HOSPITAL_COMMUNITY): Payer: Medicaid Other

## 2021-01-05 DIAGNOSIS — Z6841 Body Mass Index (BMI) 40.0 and over, adult: Secondary | ICD-10-CM | POA: Diagnosis not present

## 2021-01-05 DIAGNOSIS — L209 Atopic dermatitis, unspecified: Secondary | ICD-10-CM | POA: Diagnosis not present

## 2021-01-05 DIAGNOSIS — M3213 Lung involvement in systemic lupus erythematosus: Secondary | ICD-10-CM | POA: Diagnosis not present

## 2021-01-05 DIAGNOSIS — K146 Glossodynia: Secondary | ICD-10-CM | POA: Diagnosis not present

## 2021-01-07 DIAGNOSIS — R0902 Hypoxemia: Secondary | ICD-10-CM | POA: Diagnosis not present

## 2021-01-07 DIAGNOSIS — M3213 Lung involvement in systemic lupus erythematosus: Secondary | ICD-10-CM | POA: Diagnosis not present

## 2021-01-10 ENCOUNTER — Ambulatory Visit (HOSPITAL_COMMUNITY): Payer: Medicaid Other

## 2021-01-10 DIAGNOSIS — F4323 Adjustment disorder with mixed anxiety and depressed mood: Secondary | ICD-10-CM | POA: Diagnosis not present

## 2021-01-11 ENCOUNTER — Emergency Department (HOSPITAL_COMMUNITY): Payer: Medicaid Other

## 2021-01-11 ENCOUNTER — Encounter (HOSPITAL_COMMUNITY): Payer: Self-pay

## 2021-01-11 ENCOUNTER — Other Ambulatory Visit: Payer: Self-pay

## 2021-01-11 ENCOUNTER — Emergency Department (HOSPITAL_COMMUNITY)
Admission: EM | Admit: 2021-01-11 | Discharge: 2021-01-12 | Disposition: A | Discharge status: Home / Self Care | Payer: Medicaid Other | Attending: Physician Assistant | Admitting: Physician Assistant

## 2021-01-11 DIAGNOSIS — J111 Influenza due to unidentified influenza virus with other respiratory manifestations: Secondary | ICD-10-CM | POA: Diagnosis not present

## 2021-01-11 DIAGNOSIS — R519 Headache, unspecified: Secondary | ICD-10-CM | POA: Diagnosis present

## 2021-01-11 DIAGNOSIS — Z5321 Procedure and treatment not carried out due to patient leaving prior to being seen by health care provider: Secondary | ICD-10-CM | POA: Diagnosis not present

## 2021-01-11 DIAGNOSIS — R509 Fever, unspecified: Secondary | ICD-10-CM | POA: Diagnosis not present

## 2021-01-11 DIAGNOSIS — I10 Essential (primary) hypertension: Secondary | ICD-10-CM | POA: Diagnosis not present

## 2021-01-11 DIAGNOSIS — R5383 Other fatigue: Secondary | ICD-10-CM | POA: Diagnosis not present

## 2021-01-11 DIAGNOSIS — R0902 Hypoxemia: Secondary | ICD-10-CM | POA: Diagnosis not present

## 2021-01-11 DIAGNOSIS — R0602 Shortness of breath: Secondary | ICD-10-CM | POA: Diagnosis not present

## 2021-01-11 DIAGNOSIS — R000 Tachycardia, unspecified: Secondary | ICD-10-CM | POA: Diagnosis not present

## 2021-01-11 LAB — COMPREHENSIVE METABOLIC PANEL
ALT: 12 U/L (ref 0–44)
AST: 18 U/L (ref 15–41)
Albumin: 3.6 g/dL (ref 3.5–5.0)
Alkaline Phosphatase: 59 U/L (ref 38–126)
Anion gap: 9 (ref 5–15)
BUN: <5 mg/dL — ABNORMAL LOW (ref 6–20)
CO2: 28 mmol/L (ref 22–32)
Calcium: 8.8 mg/dL — ABNORMAL LOW (ref 8.9–10.3)
Chloride: 100 mmol/L (ref 98–111)
Creatinine, Ser: 0.52 mg/dL (ref 0.44–1.00)
GFR, Estimated: >60 mL/min (ref >60)
Glucose, Bld: 107 mg/dL — ABNORMAL HIGH (ref 70–99)
Potassium: 4.2 mmol/L (ref 3.5–5.1)
Sodium: 137 mmol/L (ref 135–145)
Total Bilirubin: 0.5 mg/dL (ref 0.3–1.2)
Total Protein: 7.9 g/dL (ref 6.5–8.1)

## 2021-01-11 LAB — CBC WITH DIFFERENTIAL/PLATELET
Abs Immature Granulocytes: 0.05 10*3/uL (ref 0.00–0.07)
Basophils Absolute: 0 10*3/uL (ref 0.0–0.1)
Basophils Relative: 0 %
Eosinophils Absolute: 0 10*3/uL (ref 0.0–0.5)
Eosinophils Relative: 0 %
HCT: 44.9 % (ref 36.0–46.0)
Hemoglobin: 13.6 g/dL (ref 12.0–15.0)
Immature Granulocytes: 0 %
Lymphocytes Relative: 3 %
Lymphs Abs: 0.4 10*3/uL — ABNORMAL LOW (ref 0.7–4.0)
MCH: 25.5 pg — ABNORMAL LOW (ref 26.0–34.0)
MCHC: 30.3 g/dL (ref 30.0–36.0)
MCV: 84.1 fL (ref 80.0–100.0)
Monocytes Absolute: 1.1 10*3/uL — ABNORMAL HIGH (ref 0.1–1.0)
Monocytes Relative: 8 %
Neutro Abs: 11.3 10*3/uL — ABNORMAL HIGH (ref 1.7–7.7)
Neutrophils Relative %: 89 %
Platelets: 340 10*3/uL (ref 150–400)
RBC: 5.34 MIL/uL — ABNORMAL HIGH (ref 3.87–5.11)
RDW: 15.8 % — ABNORMAL HIGH (ref 11.5–15.5)
WBC: 13 10*3/uL — ABNORMAL HIGH (ref 4.0–10.5)
nRBC: 0 % (ref 0.0–0.2)

## 2021-01-11 MED ORDER — ACETAMINOPHEN 325 MG PO TABS: 650.0000 mg | ORAL_TABLET | Freq: Once | ORAL | Status: DC

## 2021-01-11 NOTE — ED Triage Notes (Addendum)
Patient arrived via [facility] from home.   Called out for breathing problems.   Covid like sx for the past 4 days.  Mother also has symptoms.    Exposed to the flu the other day from another family member.   Hx: Lupus   Uses 02 as needed at home  90% RA. Per ems patient 02 is usually in the 52s.  Patient placed on 2 liters and now at 100%  HR-125 130/90 T-98  Ambulatory per ems

## 2021-01-11 NOTE — ED Provider Notes (Signed)
Emergency Medicine Provider Triage Evaluation Note  Rhonda Franco , a 20 y.o. female  was evaluated in triage.  Pt complains of shortness of breath for the past 4 days.  Family member has the flu.  She has had shortness of breath which is worsened with exertion.  Prior history of SLE.  No prior history of blood clots, no prior history of CAD.  Febrile with a temperature of 100 in triage.  Review of Systems  Positive: Fever, shortness of breath Negative: Chest pain, leg swelling  Physical Exam  BP 127/89   Pulse (!) 131   Temp (!) 100.9 F (38.3 C) (Oral)   Resp 18   LMP 12/27/2020   SpO2 (!) 88%  Gen:   Ill appearing, tachypnea  Resp:  Normal effort  MSK:   Moves extremities without difficulty  Other:    Medical Decision Making  Medically screening exam initiated at 7:10 PM.  Appropriate orders placed.  Aino Rosenburg was informed that the remainder of the evaluation will be completed by another provider, this initial triage assessment does not replace that evaluation, and the importance of remaining in the ED until their evaluation is complete.     Janeece Fitting, PA-C 01/11/21 1911    Lennice Sites, DO 01/11/21 2130

## 2021-01-11 NOTE — ED Notes (Signed)
Patient reports she is no shob right now and does not need oxygen at this time.

## 2021-01-12 ENCOUNTER — Ambulatory Visit (HOSPITAL_COMMUNITY): Payer: Medicaid Other

## 2021-01-17 ENCOUNTER — Ambulatory Visit (HOSPITAL_COMMUNITY): Payer: Medicaid Other

## 2021-01-17 ENCOUNTER — Emergency Department (HOSPITAL_COMMUNITY)
Admission: EM | Admit: 2021-01-17 | Discharge: 2021-01-18 | Disposition: A | Discharge status: Home / Self Care | Payer: Medicaid Other | Attending: Emergency Medicine | Admitting: Emergency Medicine

## 2021-01-17 ENCOUNTER — Other Ambulatory Visit: Payer: Self-pay

## 2021-01-17 ENCOUNTER — Emergency Department (HOSPITAL_COMMUNITY): Payer: Medicaid Other

## 2021-01-17 DIAGNOSIS — R1030 Lower abdominal pain, unspecified: Secondary | ICD-10-CM | POA: Diagnosis not present

## 2021-01-17 DIAGNOSIS — Z5321 Procedure and treatment not carried out due to patient leaving prior to being seen by health care provider: Secondary | ICD-10-CM | POA: Diagnosis not present

## 2021-01-17 DIAGNOSIS — R079 Chest pain, unspecified: Secondary | ICD-10-CM | POA: Insufficient documentation

## 2021-01-17 LAB — CBC WITH DIFFERENTIAL/PLATELET
Abs Immature Granulocytes: 0.1 10*3/uL — ABNORMAL HIGH (ref 0.00–0.07)
Basophils Absolute: 0 10*3/uL (ref 0.0–0.1)
Basophils Relative: 0 %
Eosinophils Absolute: 0 10*3/uL (ref 0.0–0.5)
Eosinophils Relative: 0 %
HCT: 44.5 % (ref 36.0–46.0)
Hemoglobin: 13.3 g/dL (ref 12.0–15.0)
Immature Granulocytes: 1 %
Lymphocytes Relative: 10 %
Lymphs Abs: 1.1 10*3/uL (ref 0.7–4.0)
MCH: 24.8 pg — ABNORMAL LOW (ref 26.0–34.0)
MCHC: 29.9 g/dL — ABNORMAL LOW (ref 30.0–36.0)
MCV: 82.9 fL (ref 80.0–100.0)
Monocytes Absolute: 1 10*3/uL (ref 0.1–1.0)
Monocytes Relative: 9 %
Neutro Abs: 8.8 10*3/uL — ABNORMAL HIGH (ref 1.7–7.7)
Neutrophils Relative %: 80 %
Platelets: 398 10*3/uL (ref 150–400)
RBC: 5.37 MIL/uL — ABNORMAL HIGH (ref 3.87–5.11)
RDW: 15.3 % (ref 11.5–15.5)
WBC: 11 10*3/uL — ABNORMAL HIGH (ref 4.0–10.5)
nRBC: 0 % (ref 0.0–0.2)

## 2021-01-17 LAB — BASIC METABOLIC PANEL
Anion gap: 11 (ref 5–15)
BUN: 5 mg/dL — ABNORMAL LOW (ref 6–20)
CO2: 25 mmol/L (ref 22–32)
Calcium: 8.7 mg/dL — ABNORMAL LOW (ref 8.9–10.3)
Chloride: 100 mmol/L (ref 98–111)
Creatinine, Ser: 0.6 mg/dL (ref 0.44–1.00)
GFR, Estimated: >60 mL/min (ref >60)
Glucose, Bld: 122 mg/dL — ABNORMAL HIGH (ref 70–99)
Potassium: 4 mmol/L (ref 3.5–5.1)
Sodium: 136 mmol/L (ref 135–145)

## 2021-01-17 LAB — TROPONIN I (HIGH SENSITIVITY): Troponin I (High Sensitivity): 9 ng/L (ref <18)

## 2021-01-17 NOTE — ED Triage Notes (Signed)
Pt has had the flu for the past two week, which has exacerbated s/s from lupus. Concerned about worsening chronic chest pain. Is Immunocompromised with HX lupus. No recent fevers.

## 2021-01-17 NOTE — ED Provider Notes (Signed)
Emergency Medicine Provider Triage Evaluation Note  Rhonda Franco , a 20 y.o. female  was evaluated in triage.  Pt complains of patient presents with chest pain, states been going for last couple days, states pain is intermittent, worsens after she coughs, feels pain in the middle of her chest will radiate down to her abdomen, has associate shortness of breath, has pericarditis secondary due to lupus flareup, no history of PEs or DVTs, currently not on birth control.  She denies any alleviating factors..  Review of Systems  Positive: Chest pain, shortness of breath Negative: Abdominal pain, nausea vomiting  Physical Exam  BP 109/72 (BP Location: Right Arm)   Pulse (!) 109   Temp 98.2 F (36.8 C) (Oral)   Resp 20   Ht _0  (1.676 m)   Wt 123.8 kg   LMP 12/27/2020   SpO2 91%   BMI 44.06 kg/m  Gen:   Awake, no distress   Resp:  Normal effort  MSK:   Moves extremities without difficulty  Other:    Medical Decision Making  Medically screening exam initiated at 8:03 PM.  Appropriate orders placed.  Curtina Haymaker was informed that the remainder of the evaluation will be completed by another provider, this initial triage assessment does not replace that evaluation, and the importance of remaining in the ED until their evaluation is complete.  Chest pain and shortness of breath, lab or imaging have been ordered.   Marcello Fennel, PA-C 01/17/21 2004    Deno Etienne, DO 01/17/21 2316

## 2021-01-17 NOTE — ED Notes (Signed)
Called patient x4 for vital check no answer

## 2021-01-19 ENCOUNTER — Ambulatory Visit (HOSPITAL_COMMUNITY): Payer: Medicaid Other

## 2021-01-24 ENCOUNTER — Ambulatory Visit (HOSPITAL_COMMUNITY): Payer: Medicaid Other

## 2021-01-26 ENCOUNTER — Ambulatory Visit (HOSPITAL_COMMUNITY): Payer: Medicaid Other

## 2021-01-31 ENCOUNTER — Ambulatory Visit (HOSPITAL_COMMUNITY): Payer: Medicaid Other

## 2021-02-02 ENCOUNTER — Telehealth (HOSPITAL_COMMUNITY): Payer: Self-pay

## 2021-02-02 ENCOUNTER — Ambulatory Visit (HOSPITAL_COMMUNITY): Payer: Medicaid Other

## 2021-02-02 NOTE — Telephone Encounter (Signed)
Called and spoke with pt in regards to PR, pt stated she is no longer interested due to her work schedule.   Closed referral

## 2021-02-07 ENCOUNTER — Ambulatory Visit (HOSPITAL_COMMUNITY): Payer: Medicaid Other

## 2021-02-07 DIAGNOSIS — R0902 Hypoxemia: Secondary | ICD-10-CM | POA: Diagnosis not present

## 2021-02-07 DIAGNOSIS — M3213 Lung involvement in systemic lupus erythematosus: Secondary | ICD-10-CM | POA: Diagnosis not present

## 2021-02-09 ENCOUNTER — Ambulatory Visit (HOSPITAL_COMMUNITY): Payer: Medicaid Other

## 2021-02-14 ENCOUNTER — Ambulatory Visit (HOSPITAL_COMMUNITY): Payer: Medicaid Other

## 2021-03-23 ENCOUNTER — Telehealth: Payer: Self-pay

## 2021-03-23 NOTE — Telephone Encounter (Signed)
..  Patient declines further follow up and engagement by the Managed Medicaid Team. Appropriate care team members and provider have been notified via electronic communication. The Managed Medicaid Team is available to follow up with the patient after provider conversation with the patient regarding recommendation for engagement and subsequent re-referral to the Managed Medicaid Team.    [place], [place]

## 2021-03-31 ENCOUNTER — Encounter: Payer: Self-pay | Admitting: *Deleted

## 2021-03-31 ENCOUNTER — Encounter: Payer: Medicaid Other | Attending: Critical Care Medicine | Admitting: *Deleted

## 2021-03-31 ENCOUNTER — Ambulatory Visit (INDEPENDENT_AMBULATORY_CARE_PROVIDER_SITE_OTHER): Payer: Medicaid Other

## 2021-03-31 ENCOUNTER — Other Ambulatory Visit: Payer: Self-pay

## 2021-03-31 VITALS — BP 138/82 | HR 121 | Temp 98.6°F | Ht 65.0 in | Wt 259.6 lb

## 2021-03-31 DIAGNOSIS — Z30017 Encounter for initial prescription of implantable subdermal contraceptive: Secondary | ICD-10-CM | POA: Diagnosis not present

## 2021-03-31 DIAGNOSIS — J849 Interstitial pulmonary disease, unspecified: Secondary | ICD-10-CM

## 2021-03-31 LAB — POCT URINE PREGNANCY: Preg Test, Ur: NEGATIVE

## 2021-03-31 MED ORDER — ETONOGESTREL 68 MG ~~LOC~~ IMPL
68.0000 mg | DRUG_IMPLANT | Freq: Once | SUBCUTANEOUS | Status: AC
  Administered 2021-03-31: 68 mg via SUBCUTANEOUS

## 2021-03-31 NOTE — Progress Notes (Signed)
Initial telephone orientation completed. Diagnosis can be found in [facility] 8/31. EP orientation scheduled for Tuesday 10/4 at 8am.

## 2021-03-31 NOTE — Progress Notes (Addendum)
Subjective:     Rhonda Franco is a 20 y.o. female here at [facility] for Nexplanon insertion.  Current complaints: None.    [place] Office Visit from 06/16/2020 in [place]  PHQ-2 Total Score 1       Health Maintenance Due  Topic Date Due   HPV VACCINES (1 - 2-dose series) Never done   PAP-Cervical Cytology Screening  Never done   PAP SMEAR-Modifier  Never done   TETANUS/TDAP  07/04/2020   COVID-19 Vaccine (4 - Booster for Pfizer series) 12/06/2020   CHLAMYDIA SCREENING  01/14/2021   INFLUENZA VACCINE  02/06/2021     Risk factors for chronic health problems: Smoking: no Alchohol/how much: no Illicit drug use: no Exercises: no Pt BMI: Body mass index is 43.2 kg/m.   Gynecologic History Patient's last menstrual period was 03/05/2021 (exact date). Contraception: condoms; wants Nexplanon today Last Pap: 01/02/2021 at facility in [facility]. Patient reports results were: abnormal.  Last mammogram: n/a  Obstetric History OB History  Gravida Para Term Preterm AB Living  0 0 0 0 0 0  SAB IAB Ectopic Multiple Live Births  0 0 0 0 0    The following portions of the patient's history were reviewed and updated as appropriate: allergies, current medications, past family history, past medical history, past social history, past surgical history, and problem list.  Review of Systems Pertinent items are noted in HPI.    Objective:   BP 138/82 (BP Location: Left Arm, Patient Position: Sitting, Cuff Size: Large)   Pulse (!) 121   Temp 98.6 F (37 C) (Oral)   Ht _0  (1.651 m)   Wt 259 lb 9.6 oz (117.8 kg)   LMP 03/05/2021 (Exact Date)   BMI 43.20 kg/m  VS reviewed, nursing note reviewed,  Constitutional: well developed, well nourished, no distress HEENT: normocephalic CV: normal rate Pulm/chest wall: normal effort Breast Exam: Deferred Abdomen: soft Neuro: alert and oriented x 3 Skin: warm, dry Psych: affect  normal Pelvic exam: Deferred  Nexplanon Insertion Procedure Note  Patient was identified. Informed consent was signed, signed copy in chart. A time-out was performed.    The insertion site was identified 8-10 cm (3-4 inches) from the medial epicondyle of the humerus and 3-5 cm (1.25-2 inches) posterior to (below) the sulcus (groove) between the biceps and triceps muscles of the patient's right arm and marked. The site was prepped and draped in the usual sterile fashion. Pt was prepped with alcohol swab and then injected with 3 cc of 1% lidocaine. The site was prepped with betadine. Nexplanon removed form packaging,  Device confirmed in needle, then inserted full length of needle and withdrawn per handbook instructions. Provider and patient verified presence of the implant in the woman's arm by palpation. Pt insertion site was covered with steristrips/adhesive bandage and pressure bandage. There was minimal blood loss. Patient tolerated procedure well.  Patient was given post procedure instructions and Nexplanon user card with expiration date. Condoms were recommended for STI prevention. Patient was asked to keep the pressure dressing on for 24 hours to minimize bruising and keep the adhesive bandage on for 3-5 days. The patient verbalized understanding of the plan of care and agrees.      Assessment/Plan:   1. Nexplanon insertion - UPT negative - See procedure note as above - Patient signed ROI to have Pap results sent   - POCT urine pregnancy    Follow up as needed.  Renee Harder, CNM 03/31/21 10:26 AM

## 2021-04-06 ENCOUNTER — Ambulatory Visit: Payer: Medicaid Other

## 2021-04-11 ENCOUNTER — Encounter: Payer: Medicaid Other | Attending: Critical Care Medicine

## 2021-04-11 DIAGNOSIS — J849 Interstitial pulmonary disease, unspecified: Secondary | ICD-10-CM | POA: Insufficient documentation

## 2021-05-04 ENCOUNTER — Other Ambulatory Visit: Payer: Self-pay

## 2021-05-04 VITALS — Ht 65.75 in | Wt 248.6 lb

## 2021-05-04 DIAGNOSIS — J849 Interstitial pulmonary disease, unspecified: Secondary | ICD-10-CM | POA: Diagnosis not present

## 2021-05-04 NOTE — Patient Instructions (Signed)
Patient Instructions  Patient Details  Name: Rhonda Franco MRN: [id number] Date of Birth: Feb 23, 2001 Referring Provider:  Vonita Moss, MD  Below are your personal goals for exercise, nutrition, and risk factors. Our goal is to help you stay on track towards obtaining and maintaining these goals. We will be discussing your progress on these goals with you throughout the program.  Initial Exercise Prescription:  Initial Exercise Prescription - 05/04/21 1000       Date of Initial Exercise RX and Referring Provider   Date 05/04/21    Referring Provider Vonita Moss MD      Oxygen   Oxygen Continuous    Liters 6    Maintain Oxygen Saturation 88% or higher      Treadmill   MPH 1.3    Grade 0    Minutes 15    METs 2      Recumbant Bike   Level 1    RPM 60    Minutes 15    METs 4      NuStep   Level 1    SPM 80    Minutes 15    METs 4      REL-XR   Level 1    Speed 50    Minutes 15    METs 4      Prescription Details   Frequency (times per week) 3    Duration Progress to 30 minutes of continuous aerobic without signs/symptoms of physical distress      Intensity   THRR 40-80% of Max Heartrate 143-180    Ratings of Perceived Exertion 11-13    Perceived Dyspnea 0-4      Progression   Progression Continue to progress workloads to maintain intensity without signs/symptoms of physical distress.      Resistance Training   Training Prescription Yes    Weight 3 lb    Reps 10-15             Exercise Goals: Frequency: Be able to perform aerobic exercise two to three times per week in program working toward 2-5 days per week of home exercise.  Intensity: Work with a perceived exertion of 11 (fairly light) - 15 (hard) while following your exercise prescription.  We will make changes to your prescription with you as you progress through the program.   Duration: Be able to do 30 to 45 minutes of continuous aerobic exercise in addition to a 5 minute warm-up and a  5 minute cool-down routine.   Nutrition Goals: Your personal nutrition goals will be established when you do your nutrition analysis with the dietician.  The following are general nutrition guidelines to follow: Cholesterol < 276m/day Sodium < 15049mday Fiber: Women under 50 yrs - 25 grams per day  Personal Goals:  Personal Goals and Risk Factors at Admission - 05/04/21 1042       Core Components/Risk Factors/Patient Goals on Admission    Weight Management Yes;Weight Loss;Obesity    Intervention Weight Management: Provide education and appropriate resources to help participant work on and attain dietary goals.;Weight Management: Develop a combined nutrition and exercise program designed to reach desired caloric intake, while maintaining appropriate intake of nutrient and fiber, sodium and fats, and appropriate energy expenditure required for the weight goal.    Admit Weight 248 lb (112.5 kg)    Goal Weight: Short Term 242 lb (109.8 kg)    Goal Weight: Long Term 215 lb (97.5 kg)    Expected Outcomes Long Term: Adherence to  nutrition and physical activity/exercise program aimed toward attainment of established weight goal;Short Term: Continue to assess and modify interventions until short term weight is achieved;Weight Loss: Understanding of general recommendations for a balanced deficit meal plan, which promotes 1-2 lb weight loss per week and includes a negative energy balance of [phone] kcal/d;Understanding recommendations for meals to include 15-35% energy as protein, 25-35% energy from fat, 35-60% energy from carbohydrates, less than 252m of dietary cholesterol, 20-35 gm of total fiber daily;Understanding of distribution of calorie intake throughout the day with the consumption of 4-5 meals/snacks    Improve shortness of breath with ADL's Yes    Intervention Provide education, individualized exercise plan and daily activity instruction to help decrease symptoms of SOB with activities of  daily living.    Expected Outcomes Short Term: Improve cardiorespiratory fitness to achieve a reduction of symptoms when performing ADLs;Long Term: Be able to perform more ADLs without symptoms or delay the onset of symptoms             Tobacco Use Initial Evaluation: Social History   Tobacco Use  Smoking Status Never  Smokeless Tobacco Never    Exercise Goals and Review:  Exercise Goals     Row Name 05/04/21 1041             Exercise Goals   Increase Physical Activity Yes       Intervention Provide advice, education, support and counseling about physical activity/exercise needs.;Develop an individualized exercise prescription for aerobic and resistive training based on initial evaluation findings, risk stratification, comorbidities and participant's personal goals.       Expected Outcomes Short Term: Attend rehab on a regular basis to increase amount of physical activity.;Long Term: Exercising regularly at least 3-5 days a week.;Long Term: Add in home exercise to make exercise part of routine and to increase amount of physical activity.       Increase Strength and Stamina Yes       Intervention Provide advice, education, support and counseling about physical activity/exercise needs.;Develop an individualized exercise prescription for aerobic and resistive training based on initial evaluation findings, risk stratification, comorbidities and participant's personal goals.       Expected Outcomes Short Term: Increase workloads from initial exercise prescription for resistance, speed, and METs.;Short Term: Perform resistance training exercises routinely during rehab and add in resistance training at home;Long Term: Improve cardiorespiratory fitness, muscular endurance and strength as measured by increased METs and functional capacity (6MWT)       Able to understand and use rate of perceived exertion (RPE) scale Yes       Intervention Provide education and explanation on how to use RPE  scale       Expected Outcomes Short Term: Able to use RPE daily in rehab to express subjective intensity level;Long Term:  Able to use RPE to guide intensity level when exercising independently       Able to understand and use Dyspnea scale Yes       Intervention Provide education and explanation on how to use Dyspnea scale       Expected Outcomes Short Term: Able to use Dyspnea scale daily in rehab to express subjective sense of shortness of breath during exertion;Long Term: Able to use Dyspnea scale to guide intensity level when exercising independently       Knowledge and understanding of Target Heart Rate Range (THRR) Yes       Intervention Provide education and explanation of THRR including how the numbers were predicted  and where they are located for reference       Expected Outcomes Short Term: Able to state/look up THRR;Long Term: Able to use THRR to govern intensity when exercising independently;Short Term: Able to use daily as guideline for intensity in rehab       Able to check pulse independently Yes       Intervention Provide education and demonstration on how to check pulse in carotid and radial arteries.;Review the importance of being able to check your own pulse for safety during independent exercise       Expected Outcomes Short Term: Able to explain why pulse checking is important during independent exercise;Long Term: Able to check pulse independently and accurately       Understanding of Exercise Prescription Yes       Intervention Provide education, explanation, and written materials on patient's individual exercise prescription       Expected Outcomes Short Term: Able to explain program exercise prescription;Long Term: Able to explain home exercise prescription to exercise independently                Copy of goals given to participant.

## 2021-05-04 NOTE — Progress Notes (Addendum)
Pulmonary Individual Treatment Plan  Patient Details  Name: Rhonda Franco MRN: [id number] Date of Birth: 2000/08/08 Referring Provider:   Flowsheet Row Pulmonary Rehab from 05/04/2021 in [facility] Cardiac and Pulmonary Rehab  Referring Provider Vonita Moss MD       Initial Encounter Date:  Flowsheet Row Pulmonary Rehab from 05/04/2021 in [facility] Cardiac and Pulmonary Rehab  Date 05/04/21       Visit Diagnosis: ILD (interstitial lung disease) ([place])  Patient's Home Medications on Admission:  Current Outpatient Medications:    aspirin EC 81 MG tablet, Take 81 mg by mouth daily., Disp: , Rfl:    atovaquone (MEPRON) 750 MG/5ML suspension, Take by mouth., Disp: , Rfl:    Drospirenone (SLYND) 4 MG TABS, Take 1 tablet by mouth daily., Disp: 168 tablet, Rfl: 0   fluticasone (FLONASE) 50 MCG/ACT nasal spray, Place into both nostrils., Disp: , Rfl:    gabapentin (NEURONTIN) 300 MG capsule, Take 300 mg by mouth daily., Disp: , Rfl:    hydroxychloroquine (PLAQUENIL) 200 MG tablet, Take 1 tablet (200 mg total) by mouth 2 (two) times daily., Disp: 60 tablet, Rfl: 0   MITIGARE 0.6 MG CAPS, Take 0.6 mg by mouth 2 (two) times daily., Disp: , Rfl:    mycophenolate (CELLCEPT) 500 MG tablet, Take 500 mg by mouth 2 (two) times daily. (Patient not taking: Reported on 03/31/2021), Disp: , Rfl:    mycophenolate (MYFORTIC) 360 MG TBEC EC tablet, Take by mouth., Disp: , Rfl:    OZEMPIC, 0.25 OR 0.5 MG/DOSE, 2 MG/1.5ML SOPN, SMARTSIG:0.25 Milligram(s) SUB-Q Once a Week, Disp: , Rfl:    topiramate (TOPAMAX) 50 MG tablet, Take by mouth., Disp: , Rfl:   Past Medical History: Past Medical History:  Diagnosis Date   Anemia    Lupus ([place])    Prediabetes     Tobacco Use: Social History   Tobacco Use  Smoking Status Never  Smokeless Tobacco Never    Labs: Recent Review Flowsheet Data     Labs for ITP Cardiac and Pulmonary Rehab Latest Ref Rng & Units 05/05/2018 09/12/2018 08/17/2019 06/16/2020   Cholestrol  100 - 199 mg/dL 116 - - 161   LDLCALC 0 - 99 mg/dL 69 - - 108(H)   HDL >39 mg/dL 24(L) - - 34(L)   Trlycerides 0 - 149 mg/dL 115(H) - - 100   Hemoglobin A1c 4.8 - 5.6 % 5.8(H) 5.4 5.5 -        Pulmonary Assessment Scores:  Pulmonary Assessment Scores     Row Name 05/04/21 1001         ADL UCSD   ADL Phase Entry     SOB Score total 49     Rest 1     Walk 2     Stairs 4     Bath 3     Dress 3     Shop 3       CAT Score   CAT Score 25       mMRC Score   mMRC Score 3              UCSD: Self-administered rating of dyspnea associated with activities of daily living (ADLs) 6-point scale (0 = "not at all" to 5 = "maximal or unable to do because of breathlessness")  Scoring Scores range from 0 to 120.  Minimally important difference is 5 units  CAT: CAT can identify the health impairment of COPD patients and is better correlated with disease progression.  CAT has  a scoring range of zero to 40. The CAT score is classified into four groups of low (less than 10), medium (10 - 20), high (21-30) and very high (31-40) based on the impact level of disease on health status. A CAT score over 10 suggests significant symptoms.  A worsening CAT score could be explained by an exacerbation, poor medication adherence, poor inhaler technique, or progression of COPD or comorbid conditions.  CAT MCID is 2 points  mMRC: mMRC (Modified Medical Research Council) Dyspnea Scale is used to assess the degree of baseline functional disability in patients of respiratory disease due to dyspnea. No minimal important difference is established. A decrease in score of 1 point or greater is considered a positive change.   Pulmonary Function Assessment:   Exercise Target Goals: Exercise Program Goal: Individual exercise prescription set using results from initial 6 min walk test and THRR while considering  patient's activity barriers and safety.   Exercise Prescription Goal: Initial exercise  prescription builds to 30-45 minutes a day of aerobic activity, 2-3 days per week.  Home exercise guidelines will be given to patient during program as part of exercise prescription that the participant will acknowledge.  Education: Aerobic Exercise: - Group verbal and visual presentation on the components of exercise prescription. Introduces F.I.T.T principle from ACSM for exercise prescriptions.  Reviews F.I.T.T. principles of aerobic exercise including progression. Written material given at graduation.   Education: Resistance Exercise: - Group verbal and visual presentation on the components of exercise prescription. Introduces F.I.T.T principle from ACSM for exercise prescriptions  Reviews F.I.T.T. principles of resistance exercise including progression. Written material given at graduation.    Education: Exercise & Equipment Safety: - Individual verbal instruction and demonstration of equipment use and safety with use of the equipment. Flowsheet Row Pulmonary Rehab from 05/04/2021 in [facility] Cardiac and Pulmonary Rehab  Education need identified 05/04/21  Date 05/04/21  Educator [place]  Instruction Review Code 1- Verbalizes Understanding       Education: Exercise Physiology & General Exercise Guidelines: - Group verbal and written instruction with models to review the exercise physiology of the cardiovascular system and associated critical values. Provides general exercise guidelines with specific guidelines to those with heart or lung disease.    Education: Flexibility, Balance, Mind/Body Relaxation: - Group verbal and visual presentation with interactive activity on the components of exercise prescription. Introduces F.I.T.T principle from ACSM for exercise prescriptions. Reviews F.I.T.T. principles of flexibility and balance exercise training including progression. Also discusses the mind body connection.  Reviews various relaxation techniques to help reduce and manage stress (i.e. Deep  breathing, progressive muscle relaxation, and visualization). Balance handout provided to take home. Written material given at graduation.   Activity Barriers & Risk Stratification:  Activity Barriers & Cardiac Risk Stratification - 05/04/21 1026       Activity Barriers & Cardiac Risk Stratification   Activity Barriers Joint Problems;Deconditioning;Muscular Weakness;Shortness of Breath             6 Minute Walk:  6 Minute Walk     Row Name 05/04/21 1019         6 Minute Walk   Phase Initial     Distance 765 feet     Walk Time 5.75 minutes  Stopped at 5:30 min due to O2 sat     # of Rest Breaks 0     MPH 1.51     METS 4.27     RPE 10     Perceived Dyspnea  3  VO2 Peak 14.97     Symptoms Yes (comment)     Comments SOB, chest tightness     Resting HR 107 bpm     Resting BP 118/76     Resting Oxygen Saturation  99 %     Exercise Oxygen Saturation  during 6 min walk 80 %     Max Ex. HR 146 bpm     Max Ex. BP 126/80     2 Minute Post BP 120/80       Interval HR   1 Minute HR 122     2 Minute HR 132     3 Minute HR 136     4 Minute HR 143     5 Minute HR 145     6 Minute HR 146     2 Minute Post HR 126     Interval Heart Rate? Yes       Interval Oxygen   Interval Oxygen? Yes     Baseline Oxygen Saturation % 99 %     1 Minute Oxygen Saturation % 94 %     1 Minute Liters of Oxygen 6 L     2 Minute Oxygen Saturation % 92 %     2 Minute Liters of Oxygen 6 L     3 Minute Oxygen Saturation % 88 %     3 Minute Liters of Oxygen 6 L     4 Minute Oxygen Saturation % 87 %     4 Minute Liters of Oxygen 6 L     5 Minute Oxygen Saturation % 83 %     5 Minute Liters of Oxygen 6 L     6 Minute Oxygen Saturation % 80 %     6 Minute Liters of Oxygen 6 L     2 Minute Post Oxygen Saturation % 96 %     2 Minute Post Liters of Oxygen 6 L             Oxygen Initial Assessment:  Oxygen Initial Assessment - 05/04/21 1000       Home Oxygen   Home Oxygen Device Home  Concentrator;E-Tanks    Sleep Oxygen Prescription None   she is being evaluated to see if she needs O2 at night per patient   Home Exercise Oxygen Prescription Continuous    Liters per minute 6    Home Resting Oxygen Prescription Continuous   as needed per patient   Liters per minute 6    Compliance with Home Oxygen Use No      Initial 6 min Walk   Oxygen Used Continuous    Liters per minute 6      Program Oxygen Prescription   Program Oxygen Prescription Continuous    Liters per minute 6      Intervention   Short Term Goals To learn and understand importance of monitoring SPO2 with pulse oximeter and demonstrate accurate use of the pulse oximeter.;To learn and understand importance of maintaining oxygen saturations>88%;To learn and demonstrate proper pursed lip breathing techniques or other breathing techniques. ;To learn and demonstrate proper use of respiratory medications;To learn and exhibit compliance with exercise, home and travel O2 prescription    Long  Term Goals Verbalizes importance of monitoring SPO2 with pulse oximeter and return demonstration;Maintenance of O2 saturations>88%;Exhibits proper breathing techniques, such as pursed lip breathing or other method taught during program session;Compliance with respiratory medication;Demonstrates proper use of MDI's;Exhibits compliance with exercise, home  and travel O2  prescription             Oxygen Re-Evaluation:   Oxygen Discharge (Final Oxygen Re-Evaluation):   Initial Exercise Prescription:  Initial Exercise Prescription - 05/04/21 1000       Date of Initial Exercise RX and Referring Provider   Date 05/04/21    Referring Provider Vonita Moss MD      Oxygen   Oxygen Continuous    Liters 6    Maintain Oxygen Saturation 88% or higher      Treadmill   MPH 1.3    Grade 0    Minutes 15    METs 2      Recumbant Bike   Level 1    RPM 60    Minutes 15    METs 4      NuStep   Level 1    SPM 80    Minutes  15    METs 4      REL-XR   Level 1    Speed 50    Minutes 15    METs 4      Prescription Details   Frequency (times per week) 3    Duration Progress to 30 minutes of continuous aerobic without signs/symptoms of physical distress      Intensity   THRR 40-80% of Max Heartrate 143-180    Ratings of Perceived Exertion 11-13    Perceived Dyspnea 0-4      Progression   Progression Continue to progress workloads to maintain intensity without signs/symptoms of physical distress.      Resistance Training   Training Prescription Yes    Weight 3 lb    Reps 10-15             Perform Capillary Blood Glucose checks as needed.  Exercise Prescription Changes:   Exercise Prescription Changes     Row Name 05/04/21 1000             Response to Exercise   Blood Pressure (Admit) 118/76       Blood Pressure (Exercise) 126/80       Blood Pressure (Exit) 120/80       Heart Rate (Admit) 107 bpm       Heart Rate (Exercise) 146 bpm       Heart Rate (Exit) 110 bpm       Oxygen Saturation (Admit) 99 %       Oxygen Saturation (Exercise) 80 %       Oxygen Saturation (Exit) 99 %       Rating of Perceived Exertion (Exercise) 10       Perceived Dyspnea (Exercise) 3       Symptoms SOB, chest tightness       Comments walk test results                Exercise Comments:   Exercise Goals and Review:   Exercise Goals     Row Name 05/04/21 1041             Exercise Goals   Increase Physical Activity Yes       Intervention Provide advice, education, support and counseling about physical activity/exercise needs.;Develop an individualized exercise prescription for aerobic and resistive training based on initial evaluation findings, risk stratification, comorbidities and participant's personal goals.       Expected Outcomes Short Term: Attend rehab on a regular basis to increase amount of physical activity.;Long Term: Exercising regularly at least 3-5 days a week.;Long Term: Add  in home exercise to make exercise part of routine and to increase amount of physical activity.       Increase Strength and Stamina Yes       Intervention Provide advice, education, support and counseling about physical activity/exercise needs.;Develop an individualized exercise prescription for aerobic and resistive training based on initial evaluation findings, risk stratification, comorbidities and participant's personal goals.       Expected Outcomes Short Term: Increase workloads from initial exercise prescription for resistance, speed, and METs.;Short Term: Perform resistance training exercises routinely during rehab and add in resistance training at home;Long Term: Improve cardiorespiratory fitness, muscular endurance and strength as measured by increased METs and functional capacity (6MWT)       Able to understand and use rate of perceived exertion (RPE) scale Yes       Intervention Provide education and explanation on how to use RPE scale       Expected Outcomes Short Term: Able to use RPE daily in rehab to express subjective intensity level;Long Term:  Able to use RPE to guide intensity level when exercising independently       Able to understand and use Dyspnea scale Yes       Intervention Provide education and explanation on how to use Dyspnea scale       Expected Outcomes Short Term: Able to use Dyspnea scale daily in rehab to express subjective sense of shortness of breath during exertion;Long Term: Able to use Dyspnea scale to guide intensity level when exercising independently       Knowledge and understanding of Target Heart Rate Range (THRR) Yes       Intervention Provide education and explanation of THRR including how the numbers were predicted and where they are located for reference       Expected Outcomes Short Term: Able to state/look up THRR;Long Term: Able to use THRR to govern intensity when exercising independently;Short Term: Able to use daily as guideline for intensity in  rehab       Able to check pulse independently Yes       Intervention Provide education and demonstration on how to check pulse in carotid and radial arteries.;Review the importance of being able to check your own pulse for safety during independent exercise       Expected Outcomes Short Term: Able to explain why pulse checking is important during independent exercise;Long Term: Able to check pulse independently and accurately       Understanding of Exercise Prescription Yes       Intervention Provide education, explanation, and written materials on patient's individual exercise prescription       Expected Outcomes Short Term: Able to explain program exercise prescription;Long Term: Able to explain home exercise prescription to exercise independently                Exercise Goals Re-Evaluation :   Discharge Exercise Prescription (Final Exercise Prescription Changes):  Exercise Prescription Changes - 05/04/21 1000       Response to Exercise   Blood Pressure (Admit) 118/76    Blood Pressure (Exercise) 126/80    Blood Pressure (Exit) 120/80    Heart Rate (Admit) 107 bpm    Heart Rate (Exercise) 146 bpm    Heart Rate (Exit) 110 bpm    Oxygen Saturation (Admit) 99 %    Oxygen Saturation (Exercise) 80 %    Oxygen Saturation (Exit) 99 %    Rating of Perceived Exertion (Exercise) 10    Perceived Dyspnea (Exercise) 3  Symptoms SOB, chest tightness    Comments walk test results             Nutrition:  Target Goals: Understanding of nutrition guidelines, daily intake of sodium <1518m, cholesterol <2017m calories 30% from fat and 7% or less from saturated fats, daily to have 5 or more servings of fruits and vegetables.  Education: All About Nutrition: -Group instruction provided by verbal, written material, interactive activities, discussions, models, and posters to present general guidelines for heart healthy nutrition including fat, fiber, MyPlate, the role of sodium in heart  healthy nutrition, utilization of the nutrition label, and utilization of this knowledge for meal planning. Follow up email sent as well. Written material given at graduation. [facility]  Education need identified 05/04/21       Biometrics:  Pre Biometrics - 05/04/21 1026       Pre Biometrics   Height 5' 5.75" (1.67 m)    Weight 248 lb 9.6 oz (112.8 kg)    BMI (Calculated) 40.43    Single Leg Stand 3.01 seconds              Nutrition Therapy Plan and Nutrition Goals:  Nutrition Therapy & Goals - 05/04/21 0954       Personal Nutrition Goals   Comments Kynsley stated she is looking forward to getting nutrition help. She has lost her appetite recently and is dropping weight.      Intervention Plan   Intervention Prescribe, educate and counsel regarding individualized specific dietary modifications aiming towards targeted core components such as weight, hypertension, lipid management, diabetes, heart failure and other comorbidities.    Expected Outcomes Short Term Goal: Understand basic principles of dietary content, such as calories, fat, sodium, cholesterol and nutrients.;Long Term Goal: Adherence to prescribed nutrition plan.             Nutrition Assessments:  MEDIFICTS Score Key: ?70 Need to make dietary changes  40-70 Heart Healthy Diet ? 40 Therapeutic Level Cholesterol Diet  [facility]  Picture Your Plate Total Score on Admission 54      Picture Your Plate Scores: <4<76nhealthy dietary pattern with much room for improvement. 41-50 Dietary pattern unlikely to meet recommendations for good health and room for improvement. 51-60 More healthful dietary pattern, with some room for improvement.  >60 Healthy dietary pattern, although there may be some specific behaviors that could be improved.   Nutrition Goals  Re-Evaluation:   Nutrition Goals Discharge (Final Nutrition Goals Re-Evaluation):   Psychosocial: Target Goals: Acknowledge presence or absence of significant depression and/or stress, maximize coping skills, provide positive support system. Participant is able to verbalize types and ability to use techniques and skills needed for reducing stress and depression.   Education: Stress, Anxiety, and Depression - Group verbal and visual presentation to define topics covered.  Reviews how body is impacted by stress, anxiety, and depression.  Also discusses healthy ways to reduce stress and to treat/manage anxiety and depression.  Written material given at graduation.   Education: Sleep Hygiene -Provides group verbal and written instruction about how sleep can affect your health.  Define sleep hygiene, discuss sleep cycles and impact of sleep habits. Review good sleep hygiene tips.    Initial Review & Psychosocial Screening:  Initial Psych Review & Screening - 03/31/21 1333       Initial Review   Current issues with Current  Stress Concerns    Source of Stress Concerns Chronic Illness      Family Dynamics   Good Support System? Yes   mom, friends     Barriers   Psychosocial barriers to participate in program There are no identifiable barriers or psychosocial needs.;The patient should benefit from training in stress management and relaxation.      Screening Interventions   Interventions Encouraged to exercise;To provide support and resources with identified psychosocial needs;Provide feedback about the scores to participant    Expected Outcomes Short Term goal: Utilizing psychosocial counselor, staff and physician to assist with identification of specific Stressors or current issues interfering with healing process. Setting desired goal for each stressor or current issue identified.;Long Term Goal: Stressors or current issues are controlled or eliminated.;Short Term goal: Identification and  review with participant of any Quality of Life or Depression concerns found by scoring the questionnaire.;Long Term goal: The participant improves quality of Life and PHQ9 Scores as seen by post scores and/or verbalization of changes             Quality of Life Scores:  Scores of 19 and below usually indicate a poorer quality of life in these areas.  A difference of  2-3 points is a clinically meaningful difference.  A difference of 2-3 points in the total score of the Quality of Life Index has been associated with significant improvement in overall quality of life, self-image, physical symptoms, and general health in studies assessing change in quality of life.  PHQ-9: Recent Review Flowsheet Data     Depression screen [facility] 2/9 05/04/2021 06/16/2020 08/17/2019 07/31/2019 09/12/2018   Decreased Interest 0 1 0 0 0   Down, Depressed, Hopeless 1 0 0 0 1   PHQ - 2 Score 1 1 0 0 1   Altered sleeping 1 - - - 1   Tired, decreased energy 2 - - - 1   Change in appetite 3 - - - 1   Feeling bad or failure about yourself  0 - - - 0   Trouble concentrating 0 - - - 0   Moving slowly or fidgety/restless 0 - - - 0   Suicidal thoughts 0 - - - 0   PHQ-9 Score 7 - - - 4   Difficult doing work/chores Somewhat difficult - - - -      Interpretation of Total Score  Total Score Depression Severity:  1-4 = Minimal depression, 5-9 = Mild depression, 10-14 = Moderate depression, 15-19 = Moderately severe depression, 20-27 = Severe depression   Psychosocial Evaluation and Intervention:  Psychosocial Evaluation - 03/31/21 1344       Psychosocial Evaluation & Interventions   Interventions Encouraged to exercise with the program and follow exercise prescription;Stress management education    Comments Vastie was diagnosed with Lupus 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 She has been placed on oxygen and wears it when needed. Her breathing has gotten worse recently which is why she has been referred. Her  mother is her main support system. Whenever she tries to be active, her breathing gets in the way. She is hoping this program will help with how she is feeling and prevent future issues    Expected Outcomes Short: attend pulmonary rehab for education and exercise. Long: develop and maintain positive self care habits.    Continue Psychosocial Services  Follow up required by staff             Psychosocial Re-Evaluation:  Psychosocial Discharge (Final Psychosocial Re-Evaluation):   Education: Education Goals: Education classes will be provided on a weekly basis, covering required topics. Participant will state understanding/return demonstration of topics presented.  Learning Barriers/Preferences:  Learning Barriers/Preferences - 03/31/21 1343       Learning Barriers/Preferences   Learning Barriers None    Learning Preferences None             General Pulmonary Education Topics:  Infection Prevention: - Provides verbal and written material to individual with discussion of infection control including proper hand washing and proper equipment cleaning during exercise session. Flowsheet Row Pulmonary Rehab from 05/04/2021 in [facility] Cardiac and Pulmonary Rehab  Education need identified 05/04/21  Date 05/04/21  Educator [place]  Instruction Review Code 1- Verbalizes Understanding       Falls Prevention: - Provides verbal and written material to individual with discussion of falls prevention and safety. Flowsheet Row Pulmonary Rehab from 05/04/2021 in [facility] Cardiac and Pulmonary Rehab  Education need identified 05/04/21  Date 05/04/21  Educator [place]  Instruction Review Code 1- Verbalizes Understanding       Chronic Lung Disease Review: - Group verbal instruction with posters, models, PowerPoint presentations and videos,  to review new updates, new respiratory medications, new advancements in procedures and treatments. Providing information on websites and "800" numbers for  continued self-education. Includes information about supplement oxygen, available portable oxygen systems, continuous and intermittent flow rates, oxygen safety, concentrators, and Medicare reimbursement for oxygen. Explanation of Pulmonary Drugs, including class, frequency, complications, importance of spacers, rinsing mouth after steroid MDI's, and proper cleaning methods for nebulizers. Review of basic lung anatomy and physiology related to function, structure, and complications of lung disease. Review of risk factors. Discussion about methods for diagnosing sleep apnea and types of masks and machines for OSA. Includes a review of the use of types of environmental controls: home humidity, furnaces, filters, dust mite/pet prevention, HEPA vacuums. Discussion about weather changes, air quality and the benefits of nasal washing. Instruction on Warning signs, infection symptoms, calling MD promptly, preventive modes, and value of vaccinations. Review of effective airway clearance, coughing and/or vibration techniques. Emphasizing that all should Create an Action Plan. Written material given at graduation. Flowsheet Row Pulmonary Rehab from 05/04/2021 in [facility] Cardiac and Pulmonary Rehab  Education need identified 05/04/21       AED/CPR: - Group verbal and written instruction with the use of models to demonstrate the basic use of the AED with the basic ABC's of resuscitation.    Anatomy and Cardiac Procedures: - Group verbal and visual presentation and models provide information about basic cardiac anatomy and function. Reviews the testing methods done to diagnose heart disease and the outcomes of the test results. Describes the treatment choices: Medical Management, Angioplasty, or Coronary Bypass Surgery for treating various heart conditions including Myocardial Infarction, Angina, Valve Disease, and Cardiac Arrhythmias.  Written material given at graduation.   Medication Safety: - Group verbal and  visual instruction to review commonly prescribed medications for heart and lung disease. Reviews the medication, class of the drug, and side effects. Includes the steps to properly store meds and maintain the prescription regimen.  Written material given at graduation.   Other: -Provides group and verbal instruction on various topics (see comments)   Knowledge Questionnaire Score:    Core Components/Risk Factors/Patient Goals at Admission:  Personal Goals and Risk Factors at Admission - 05/04/21 1042       Core Components/Risk Factors/Patient Goals on Admission    Weight  Management Yes;Weight Loss;Obesity    Intervention Weight Management: Provide education and appropriate resources to help participant work on and attain dietary goals.;Weight Management: Develop a combined nutrition and exercise program designed to reach desired caloric intake, while maintaining appropriate intake of nutrient and fiber, sodium and fats, and appropriate energy expenditure required for the weight goal.    Admit Weight 248 lb (112.5 kg)    Goal Weight: Short Term 242 lb (109.8 kg)    Goal Weight: Long Term 215 lb (97.5 kg)    Expected Outcomes Long Term: Adherence to nutrition and physical activity/exercise program aimed toward attainment of established weight goal;Short Term: Continue to assess and modify interventions until short term weight is achieved;Weight Loss: Understanding of general recommendations for a balanced deficit meal plan, which promotes 1-2 lb weight loss per week and includes a negative energy balance of [phone] kcal/d;Understanding recommendations for meals to include 15-35% energy as protein, 25-35% energy from fat, 35-60% energy from carbohydrates, less than 289m of dietary cholesterol, 20-35 gm of total fiber daily;Understanding of distribution of calorie intake throughout the day with the consumption of 4-5 meals/snacks    Improve shortness of breath with ADL's Yes    Intervention  Provide education, individualized exercise plan and daily activity instruction to help decrease symptoms of SOB with activities of daily living.    Expected Outcomes Short Term: Improve cardiorespiratory fitness to achieve a reduction of symptoms when performing ADLs;Long Term: Be able to perform more ADLs without symptoms or delay the onset of symptoms             Education:Diabetes - Individual verbal and written instruction to review signs/symptoms of diabetes, desired ranges of glucose level fasting, after meals and with exercise. Acknowledge that pre and post exercise glucose checks will be done for 3 sessions at entry of program.   Know Your Numbers and Heart Failure: - Group verbal and visual instruction to discuss disease risk factors for cardiac and pulmonary disease and treatment options.  Reviews associated critical values for Overweight/Obesity, Hypertension, Cholesterol, and Diabetes.  Discusses basics of heart failure: signs/symptoms and treatments.  Introduces Heart Failure Zone chart for action plan for heart failure.  Written material given at graduation.   Core Components/Risk Factors/Patient Goals Review:    Core Components/Risk Factors/Patient Goals at Discharge (Final Review):    ITP Comments:  ITP Comments     Row Name 03/31/21 1342 05/04/21 0956 05/04/21 1351       ITP Comments Initial telephone orientation completed. [facility] EP orientation scheduled for Tuesday 10/4 at 8am. Completed 6MWT and gym orientation. Initial ITP created and sent for review to Dr. FOttie Glazier Medical Director. Spoke with patient regarding importance of wearing O2 at home at rest and during exertion. Patient should be checking O2 levels at home and adhering to staying above 88%. Patient was reminded to bring her O2 with her to rehab. Patient understood.              Comments: Initial ITP

## 2021-05-09 ENCOUNTER — Other Ambulatory Visit: Payer: Self-pay

## 2021-05-09 ENCOUNTER — Encounter: Payer: Medicaid Other | Attending: Critical Care Medicine

## 2021-05-09 DIAGNOSIS — J849 Interstitial pulmonary disease, unspecified: Secondary | ICD-10-CM | POA: Diagnosis not present

## 2021-05-09 NOTE — Progress Notes (Signed)
Daily Session Note  Patient Details  Name: Rhonda Franco MRN: [id number] Date of Birth: Dec 15, 2000 Referring Provider:   Flowsheet Row Pulmonary Rehab from 05/04/2021 in [facility] Cardiac and Pulmonary Rehab  Referring Provider Vonita Moss MD       Encounter Date: 05/09/2021  Check In:  Session Check In - 05/09/21 0750       Check-In   Supervising physician immediately available to respond to emergencies See telemetry face sheet for immediately available ER MD    Location [facility]-Cardiac & Pulmonary Rehab    Staff Present Birdie Sons, MPA, RN;Jessica Luan Pulling, MA, RCEP, CCRP, CCET;Amanda Sommer, BA, ACSM CEP, Exercise Physiologist    Virtual Visit No    Medication changes reported     No    Fall or balance concerns reported    No    Warm-up and Cool-down Performed on first and last piece of equipment    Resistance Training Performed Yes    VAD Patient? No    PAD/SET Patient? No      Pain Assessment   Currently in Pain? No/denies                Social History   Tobacco Use  Smoking Status Never  Smokeless Tobacco Never    Goals Met:  Independence with exercise equipment Exercise tolerated well No report of concerns or symptoms today Strength training completed today  Goals Unmet:  Not Applicable  Comments: First full day of exercise!  Patient was oriented to gym and equipment including functions, settings, policies, and procedures.  Patient's individual exercise prescription and treatment plan were reviewed.  All starting workloads were established based on the results of the 6 minute walk test done at initial orientation visit.  The plan for exercise progression was also introduced and progression will be customized based on patient's performance and goals.    Dr. Emily Filbert is Medical Director for [place].  Dr. Ottie Glazier is Medical Director for [facility] Pulmonary Rehabilitation.

## 2021-05-10 ENCOUNTER — Encounter: Payer: Self-pay | Admitting: *Deleted

## 2021-05-10 DIAGNOSIS — J849 Interstitial pulmonary disease, unspecified: Secondary | ICD-10-CM

## 2021-05-10 NOTE — Progress Notes (Signed)
Pulmonary Individual Treatment Plan  Patient Details  Name: Rhonda Franco MRN: [id number] Date of Birth: 2000/08/08 Referring Provider:   Flowsheet Row Pulmonary Rehab from 05/04/2021 in [facility] Cardiac and Pulmonary Rehab  Referring Provider Vonita Moss MD       Initial Encounter Date:  Flowsheet Row Pulmonary Rehab from 05/04/2021 in [facility] Cardiac and Pulmonary Rehab  Date 05/04/21       Visit Diagnosis: ILD (interstitial lung disease) ([place])  Patient's Home Medications on Admission:  Current Outpatient Medications:    aspirin EC 81 MG tablet, Take 81 mg by mouth daily., Disp: , Rfl:    atovaquone (MEPRON) 750 MG/5ML suspension, Take by mouth., Disp: , Rfl:    Drospirenone (SLYND) 4 MG TABS, Take 1 tablet by mouth daily., Disp: 168 tablet, Rfl: 0   fluticasone (FLONASE) 50 MCG/ACT nasal spray, Place into both nostrils., Disp: , Rfl:    gabapentin (NEURONTIN) 300 MG capsule, Take 300 mg by mouth daily., Disp: , Rfl:    hydroxychloroquine (PLAQUENIL) 200 MG tablet, Take 1 tablet (200 mg total) by mouth 2 (two) times daily., Disp: 60 tablet, Rfl: 0   MITIGARE 0.6 MG CAPS, Take 0.6 mg by mouth 2 (two) times daily., Disp: , Rfl:    mycophenolate (CELLCEPT) 500 MG tablet, Take 500 mg by mouth 2 (two) times daily. (Patient not taking: Reported on 03/31/2021), Disp: , Rfl:    mycophenolate (MYFORTIC) 360 MG TBEC EC tablet, Take by mouth., Disp: , Rfl:    OZEMPIC, 0.25 OR 0.5 MG/DOSE, 2 MG/1.5ML SOPN, SMARTSIG:0.25 Milligram(s) SUB-Q Once a Week, Disp: , Rfl:    topiramate (TOPAMAX) 50 MG tablet, Take by mouth., Disp: , Rfl:   Past Medical History: Past Medical History:  Diagnosis Date   Anemia    Lupus ([place])    Prediabetes     Tobacco Use: Social History   Tobacco Use  Smoking Status Never  Smokeless Tobacco Never    Labs: Recent Review Flowsheet Data     Labs for ITP Cardiac and Pulmonary Rehab Latest Ref Rng & Units 05/05/2018 09/12/2018 08/17/2019 06/16/2020   Cholestrol  100 - 199 mg/dL 116 - - 161   LDLCALC 0 - 99 mg/dL 69 - - 108(H)   HDL >39 mg/dL 24(L) - - 34(L)   Trlycerides 0 - 149 mg/dL 115(H) - - 100   Hemoglobin A1c 4.8 - 5.6 % 5.8(H) 5.4 5.5 -        Pulmonary Assessment Scores:  Pulmonary Assessment Scores     Row Name 05/04/21 1001         ADL UCSD   ADL Phase Entry     SOB Score total 49     Rest 1     Walk 2     Stairs 4     Bath 3     Dress 3     Shop 3       CAT Score   CAT Score 25       mMRC Score   mMRC Score 3              UCSD: Self-administered rating of dyspnea associated with activities of daily living (ADLs) 6-point scale (0 = "not at all" to 5 = "maximal or unable to do because of breathlessness")  Scoring Scores range from 0 to 120.  Minimally important difference is 5 units  CAT: CAT can identify the health impairment of COPD patients and is better correlated with disease progression.  CAT has  a scoring range of zero to 40. The CAT score is classified into four groups of low (less than 10), medium (10 - 20), high (21-30) and very high (31-40) based on the impact level of disease on health status. A CAT score over 10 suggests significant symptoms.  A worsening CAT score could be explained by an exacerbation, poor medication adherence, poor inhaler technique, or progression of COPD or comorbid conditions.  CAT MCID is 2 points  mMRC: mMRC (Modified Medical Research Council) Dyspnea Scale is used to assess the degree of baseline functional disability in patients of respiratory disease due to dyspnea. No minimal important difference is established. A decrease in score of 1 point or greater is considered a positive change.   Pulmonary Function Assessment:   Exercise Target Goals: Exercise Program Goal: Individual exercise prescription set using results from initial 6 min walk test and THRR while considering  patient's activity barriers and safety.   Exercise Prescription Goal: Initial exercise  prescription builds to 30-45 minutes a day of aerobic activity, 2-3 days per week.  Home exercise guidelines will be given to patient during program as part of exercise prescription that the participant will acknowledge.  Education: Aerobic Exercise: - Group verbal and visual presentation on the components of exercise prescription. Introduces F.I.T.T principle from ACSM for exercise prescriptions.  Reviews F.I.T.T. principles of aerobic exercise including progression. Written material given at graduation.   Education: Resistance Exercise: - Group verbal and visual presentation on the components of exercise prescription. Introduces F.I.T.T principle from ACSM for exercise prescriptions  Reviews F.I.T.T. principles of resistance exercise including progression. Written material given at graduation.    Education: Exercise & Equipment Safety: - Individual verbal instruction and demonstration of equipment use and safety with use of the equipment. Flowsheet Row Pulmonary Rehab from 05/04/2021 in [facility] Cardiac and Pulmonary Rehab  Education need identified 05/04/21  Date 05/04/21  Educator Anza  Instruction Review Code 1- Verbalizes Understanding       Education: Exercise Physiology & General Exercise Guidelines: - Group verbal and written instruction with models to review the exercise physiology of the cardiovascular system and associated critical values. Provides general exercise guidelines with specific guidelines to those with heart or lung disease.    Education: Flexibility, Balance, Mind/Body Relaxation: - Group verbal and visual presentation with interactive activity on the components of exercise prescription. Introduces F.I.T.T principle from ACSM for exercise prescriptions. Reviews F.I.T.T. principles of flexibility and balance exercise training including progression. Also discusses the mind body connection.  Reviews various relaxation techniques to help reduce and manage stress (i.e. Deep  breathing, progressive muscle relaxation, and visualization). Balance handout provided to take home. Written material given at graduation.   Activity Barriers & Risk Stratification:  Activity Barriers & Cardiac Risk Stratification - 05/04/21 1026       Activity Barriers & Cardiac Risk Stratification   Activity Barriers Joint Problems;Deconditioning;Muscular Weakness;Shortness of Breath             6 Minute Walk:  6 Minute Walk     Row Name 05/04/21 1019         6 Minute Walk   Phase Initial     Distance 765 feet     Walk Time 5.75 minutes  Stopped at 5:30 min due to O2 sat     # of Rest Breaks 0     MPH 1.51     METS 4.27     RPE 10     Perceived Dyspnea  3  VO2 Peak 14.97     Symptoms Yes (comment)     Comments SOB, chest tightness     Resting HR 107 bpm     Resting BP 118/76     Resting Oxygen Saturation  99 %     Exercise Oxygen Saturation  during 6 min walk 80 %     Max Ex. HR 146 bpm     Max Ex. BP 126/80     2 Minute Post BP 120/80       Interval HR   1 Minute HR 122     2 Minute HR 132     3 Minute HR 136     4 Minute HR 143     5 Minute HR 145     6 Minute HR 146     2 Minute Post HR 126     Interval Heart Rate? Yes       Interval Oxygen   Interval Oxygen? Yes     Baseline Oxygen Saturation % 99 %     1 Minute Oxygen Saturation % 94 %     1 Minute Liters of Oxygen 6 L     2 Minute Oxygen Saturation % 92 %     2 Minute Liters of Oxygen 6 L     3 Minute Oxygen Saturation % 88 %     3 Minute Liters of Oxygen 6 L     4 Minute Oxygen Saturation % 87 %     4 Minute Liters of Oxygen 6 L     5 Minute Oxygen Saturation % 83 %     5 Minute Liters of Oxygen 6 L     6 Minute Oxygen Saturation % 80 %     6 Minute Liters of Oxygen 6 L     2 Minute Post Oxygen Saturation % 96 %     2 Minute Post Liters of Oxygen 6 L             Oxygen Initial Assessment:  Oxygen Initial Assessment - 05/04/21 1000       Home Oxygen   Home Oxygen Device Home  Concentrator;E-Tanks    Sleep Oxygen Prescription None   she is being evaluated to see if she needs O2 at night per patient   Home Exercise Oxygen Prescription Continuous    Liters per minute 6    Home Resting Oxygen Prescription Continuous   as needed per patient   Liters per minute 6    Compliance with Home Oxygen Use No      Initial 6 min Walk   Oxygen Used Continuous    Liters per minute 6      Program Oxygen Prescription   Program Oxygen Prescription Continuous    Liters per minute 6      Intervention   Short Term Goals To learn and understand importance of monitoring SPO2 with pulse oximeter and demonstrate accurate use of the pulse oximeter.;To learn and understand importance of maintaining oxygen saturations>88%;To learn and demonstrate proper pursed lip breathing techniques or other breathing techniques. ;To learn and demonstrate proper use of respiratory medications;To learn and exhibit compliance with exercise, home and travel O2 prescription    Long  Term Goals Verbalizes importance of monitoring SPO2 with pulse oximeter and return demonstration;Maintenance of O2 saturations>88%;Exhibits proper breathing techniques, such as pursed lip breathing or other method taught during program session;Compliance with respiratory medication;Demonstrates proper use of MDI's;Exhibits compliance with exercise, home  and travel O2  prescription             Oxygen Re-Evaluation:  Oxygen Re-Evaluation     Row Name 05/09/21 0751             Program Oxygen Prescription   Program Oxygen Prescription Continuous       Liters per minute 6         Home Oxygen   Home Oxygen Device Home Concentrator;E-Tanks       Sleep Oxygen Prescription None       Home Exercise Oxygen Prescription Continuous       Liters per minute 6       Home Resting Oxygen Prescription Continuous       Compliance with Home Oxygen Use No         Goals/Expected Outcomes   Short Term Goals To learn and understand  importance of monitoring SPO2 with pulse oximeter and demonstrate accurate use of the pulse oximeter.;To learn and understand importance of maintaining oxygen saturations>88%;To learn and demonstrate proper pursed lip breathing techniques or other breathing techniques. ;To learn and exhibit compliance with exercise, home and travel O2 prescription       Long  Term Goals Verbalizes importance of monitoring SPO2 with pulse oximeter and return demonstration;Maintenance of O2 saturations>88%;Exhibits proper breathing techniques, such as pursed lip breathing or other method taught during program session;Compliance with respiratory medication;Exhibits compliance with exercise, home  and travel O2 prescription       Comments Reviewed PLB technique with pt.  Talked about how it works and it's importance in maintaining their exercise saturations.       Goals/Expected Outcomes Short: Become more profiecient at using PLB.   Long: Become independent at using PLB.                Oxygen Discharge (Final Oxygen Re-Evaluation):  Oxygen Re-Evaluation - 05/09/21 0751       Program Oxygen Prescription   Program Oxygen Prescription Continuous    Liters per minute 6      Home Oxygen   Home Oxygen Device Home Concentrator;E-Tanks    Sleep Oxygen Prescription None    Home Exercise Oxygen Prescription Continuous    Liters per minute 6    Home Resting Oxygen Prescription Continuous    Compliance with Home Oxygen Use No      Goals/Expected Outcomes   Short Term Goals To learn and understand importance of monitoring SPO2 with pulse oximeter and demonstrate accurate use of the pulse oximeter.;To learn and understand importance of maintaining oxygen saturations>88%;To learn and demonstrate proper pursed lip breathing techniques or other breathing techniques. ;To learn and exhibit compliance with exercise, home and travel O2 prescription    Long  Term Goals Verbalizes importance of monitoring SPO2 with pulse oximeter  and return demonstration;Maintenance of O2 saturations>88%;Exhibits proper breathing techniques, such as pursed lip breathing or other method taught during program session;Compliance with respiratory medication;Exhibits compliance with exercise, home  and travel O2 prescription    Comments Reviewed PLB technique with pt.  Talked about how it works and it's importance in maintaining their exercise saturations.    Goals/Expected Outcomes Short: Become more profiecient at using PLB.   Long: Become independent at using PLB.             Initial Exercise Prescription:  Initial Exercise Prescription - 05/04/21 1000       Date of Initial Exercise RX and Referring Provider   Date 05/04/21    Referring Provider Vonita Moss MD  Oxygen   Oxygen Continuous    Liters 6    Maintain Oxygen Saturation 88% or higher      Treadmill   MPH 1.3    Grade 0    Minutes 15    METs 2      Recumbant Bike   Level 1    RPM 60    Minutes 15    METs 4      NuStep   Level 1    SPM 80    Minutes 15    METs 4      REL-XR   Level 1    Speed 50    Minutes 15    METs 4      Prescription Details   Frequency (times per week) 3    Duration Progress to 30 minutes of continuous aerobic without signs/symptoms of physical distress      Intensity   THRR 40-80% of Max Heartrate 143-180    Ratings of Perceived Exertion 11-13    Perceived Dyspnea 0-4      Progression   Progression Continue to progress workloads to maintain intensity without signs/symptoms of physical distress.      Resistance Training   Training Prescription Yes    Weight 3 lb    Reps 10-15             Perform Capillary Blood Glucose checks as needed.  Exercise Prescription Changes:   Exercise Prescription Changes     Row Name 05/04/21 1000             Response to Exercise   Blood Pressure (Admit) 118/76       Blood Pressure (Exercise) 126/80       Blood Pressure (Exit) 120/80       Heart Rate (Admit) 107  bpm       Heart Rate (Exercise) 146 bpm       Heart Rate (Exit) 110 bpm       Oxygen Saturation (Admit) 99 %       Oxygen Saturation (Exercise) 80 %       Oxygen Saturation (Exit) 99 %       Rating of Perceived Exertion (Exercise) 10       Perceived Dyspnea (Exercise) 3       Symptoms SOB, chest tightness       Comments walk test results                Exercise Comments:   Exercise Comments     Row Name 05/09/21 0750           Exercise Comments First full day of exercise!  Patient was oriented to gym and equipment including functions, settings, policies, and procedures.  Patient's individual exercise prescription and treatment plan were reviewed.  All starting workloads were established based on the results of the 6 minute walk test done at initial orientation visit.  The plan for exercise progression was also introduced and progression will be customized based on patient's performance and goals.                Exercise Goals and Review:   Exercise Goals     Row Name 05/04/21 1041             Exercise Goals   Increase Physical Activity Yes       Intervention Provide advice, education, support and counseling about physical activity/exercise needs.;Develop an individualized exercise prescription for aerobic and resistive training based on initial evaluation  findings, risk stratification, comorbidities and participant's personal goals.       Expected Outcomes Short Term: Attend rehab on a regular basis to increase amount of physical activity.;Long Term: Exercising regularly at least 3-5 days a week.;Long Term: Add in home exercise to make exercise part of routine and to increase amount of physical activity.       Increase Strength and Stamina Yes       Intervention Provide advice, education, support and counseling about physical activity/exercise needs.;Develop an individualized exercise prescription for aerobic and resistive training based on initial evaluation  findings, risk stratification, comorbidities and participant's personal goals.       Expected Outcomes Short Term: Increase workloads from initial exercise prescription for resistance, speed, and METs.;Short Term: Perform resistance training exercises routinely during rehab and add in resistance training at home;Long Term: Improve cardiorespiratory fitness, muscular endurance and strength as measured by increased METs and functional capacity (6MWT)       Able to understand and use rate of perceived exertion (RPE) scale Yes       Intervention Provide education and explanation on how to use RPE scale       Expected Outcomes Short Term: Able to use RPE daily in rehab to express subjective intensity level;Long Term:  Able to use RPE to guide intensity level when exercising independently       Able to understand and use Dyspnea scale Yes       Intervention Provide education and explanation on how to use Dyspnea scale       Expected Outcomes Short Term: Able to use Dyspnea scale daily in rehab to express subjective sense of shortness of breath during exertion;Long Term: Able to use Dyspnea scale to guide intensity level when exercising independently       Knowledge and understanding of Target Heart Rate Range (THRR) Yes       Intervention Provide education and explanation of THRR including how the numbers were predicted and where they are located for reference       Expected Outcomes Short Term: Able to state/look up THRR;Long Term: Able to use THRR to govern intensity when exercising independently;Short Term: Able to use daily as guideline for intensity in rehab       Able to check pulse independently Yes       Intervention Provide education and demonstration on how to check pulse in carotid and radial arteries.;Review the importance of being able to check your own pulse for safety during independent exercise       Expected Outcomes Short Term: Able to explain why pulse checking is important during  independent exercise;Long Term: Able to check pulse independently and accurately       Understanding of Exercise Prescription Yes       Intervention Provide education, explanation, and written materials on patient's individual exercise prescription       Expected Outcomes Short Term: Able to explain program exercise prescription;Long Term: Able to explain home exercise prescription to exercise independently                Exercise Goals Re-Evaluation :  Exercise Goals Re-Evaluation     Row Name 05/09/21 0751             Exercise Goal Re-Evaluation   Exercise Goals Review Increase Physical Activity;Able to understand and use rate of perceived exertion (RPE) scale;Knowledge and understanding of Target Heart Rate Range (THRR);Understanding of Exercise Prescription;Increase Strength and Stamina;Able to understand and use Dyspnea scale;Able to check  pulse independently       Comments Reviewed RPE and dyspnea scales, THR and program prescription with pt today.  Pt voiced understanding and was given a copy of goals to take home.       Expected Outcomes Short: Use RPE daily to regulate intensity. Long: Follow program prescription in THR.                Discharge Exercise Prescription (Final Exercise Prescription Changes):  Exercise Prescription Changes - 05/04/21 1000       Response to Exercise   Blood Pressure (Admit) 118/76    Blood Pressure (Exercise) 126/80    Blood Pressure (Exit) 120/80    Heart Rate (Admit) 107 bpm    Heart Rate (Exercise) 146 bpm    Heart Rate (Exit) 110 bpm    Oxygen Saturation (Admit) 99 %    Oxygen Saturation (Exercise) 80 %    Oxygen Saturation (Exit) 99 %    Rating of Perceived Exertion (Exercise) 10    Perceived Dyspnea (Exercise) 3    Symptoms SOB, chest tightness    Comments walk test results             Nutrition:  Target Goals: Understanding of nutrition guidelines, daily intake of sodium <1557m, cholesterol <2061m calories 30%  from fat and 7% or less from saturated fats, daily to have 5 or more servings of fruits and vegetables.  Education: All About Nutrition: -Group instruction provided by verbal, written material, interactive activities, discussions, models, and posters to present general guidelines for heart healthy nutrition including fat, fiber, MyPlate, the role of sodium in heart healthy nutrition, utilization of the nutrition label, and utilization of this knowledge for meal planning. Follow up email sent as well. Written material given at graduation. [facility]  Education need identified 05/04/21       Biometrics:  Pre Biometrics - 05/04/21 1026       Pre Biometrics   Height 5' 5.75" (1.67 m)    Weight 248 lb 9.6 oz (112.8 kg)    BMI (Calculated) 40.43    Single Leg Stand 3.01 seconds              Nutrition Therapy Plan and Nutrition Goals:  Nutrition Therapy & Goals - 05/04/21 0954       Personal Nutrition Goals   Comments Ginna stated she is looking forward to getting nutrition help. She has lost her appetite recently and is dropping weight.      Intervention Plan   Intervention Prescribe, educate and counsel regarding individualized specific dietary modifications aiming towards targeted core components such as weight, hypertension, lipid management, diabetes, heart failure and other comorbidities.    Expected Outcomes Short Term Goal: Understand basic principles of dietary content, such as calories, fat, sodium, cholesterol and nutrients.;Long Term Goal: Adherence to prescribed nutrition plan.             Nutrition Assessments:  MEDIFICTS Score Key: ?70 Need to make dietary changes  40-70 Heart Healthy Diet ? 40 Therapeutic Level Cholesterol Diet  [facility]  Picture Your Plate Total Score on Admission 54      Picture Your Plate  Scores: <4<17nhealthy dietary pattern with much room for improvement. 41-50 Dietary pattern unlikely to meet recommendations for good health and room for improvement. 51-60 More healthful dietary pattern, with some room for improvement.  >60 Healthy  dietary pattern, although there may be some specific behaviors that could be improved.   Nutrition Goals Re-Evaluation:   Nutrition Goals Discharge (Final Nutrition Goals Re-Evaluation):   Psychosocial: Target Goals: Acknowledge presence or absence of significant depression and/or stress, maximize coping skills, provide positive support system. Participant is able to verbalize types and ability to use techniques and skills needed for reducing stress and depression.   Education: Stress, Anxiety, and Depression - Group verbal and visual presentation to define topics covered.  Reviews how body is impacted by stress, anxiety, and depression.  Also discusses healthy ways to reduce stress and to treat/manage anxiety and depression.  Written material given at graduation.   Education: Sleep Hygiene -Provides group verbal and written instruction about how sleep can affect your health.  Define sleep hygiene, discuss sleep cycles and impact of sleep habits. Review good sleep hygiene tips.    Initial Review & Psychosocial Screening:  Initial Psych Review & Screening - 03/31/21 1333       Initial Review   Current issues with Current Stress Concerns    Source of Stress Concerns Chronic Illness      Family Dynamics   Good Support System? Yes   mom, friends     Barriers   Psychosocial barriers to participate in program There are no identifiable barriers or psychosocial needs.;The patient should benefit from training in stress management and relaxation.      Screening Interventions   Interventions Encouraged to exercise;To provide support and resources with identified psychosocial needs;Provide feedback about the scores to participant    Expected  Outcomes Short Term goal: Utilizing psychosocial counselor, staff and physician to assist with identification of specific Stressors or current issues interfering with healing process. Setting desired goal for each stressor or current issue identified.;Long Term Goal: Stressors or current issues are controlled or eliminated.;Short Term goal: Identification and review with participant of any Quality of Life or Depression concerns found by scoring the questionnaire.;Long Term goal: The participant improves quality of Life and PHQ9 Scores as seen by post scores and/or verbalization of changes             Quality of Life Scores:  Scores of 19 and below usually indicate a poorer quality of life in these areas.  A difference of  2-3 points is a clinically meaningful difference.  A difference of 2-3 points in the total score of the Quality of Life Index has been associated with significant improvement in overall quality of life, self-image, physical symptoms, and general health in studies assessing change in quality of life.  PHQ-9: Recent Review Flowsheet Data     Depression screen [facility] 2/9 05/04/2021 06/16/2020 08/17/2019 07/31/2019 09/12/2018   Decreased Interest 0 1 0 0 0   Down, Depressed, Hopeless 1 0 0 0 1   PHQ - 2 Score 1 1 0 0 1   Altered sleeping 1 - - - 1   Tired, decreased energy 2 - - - 1   Change in appetite 3 - - - 1   Feeling bad or failure about yourself  0 - - - 0   Trouble concentrating 0 - - - 0   Moving slowly or fidgety/restless 0 - - - 0   Suicidal thoughts 0 - - - 0   PHQ-9 Score 7 - - - 4   Difficult doing work/chores Somewhat difficult - - - -      Interpretation of Total Score  Total Score Depression Severity:  1-4 = Minimal  depression, 5-9 = Mild depression, 10-14 = Moderate depression, 15-19 = Moderately severe depression, 20-27 = Severe depression   Psychosocial Evaluation and Intervention:  Psychosocial Evaluation - 03/31/21 1344       Psychosocial Evaluation &  Interventions   Interventions Encouraged to exercise with the program and follow exercise prescription;Stress management education    Comments Daysha was diagnosed with Lupus 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 She has been placed on oxygen and wears it when needed. Her breathing has gotten worse recently which is why she has been referred. Her mother is her main support system. Whenever she tries to be active, her breathing gets in the way. She is hoping this program will help with how she is feeling and prevent future issues    Expected Outcomes Short: attend pulmonary rehab for education and exercise. Long: develop and maintain positive self care habits.    Continue Psychosocial Services  Follow up required by staff             Psychosocial Re-Evaluation:   Psychosocial Discharge (Final Psychosocial Re-Evaluation):   Education: Education Goals: Education classes will be provided on a weekly basis, covering required topics. Participant will state understanding/return demonstration of topics presented.  Learning Barriers/Preferences:  Learning Barriers/Preferences - 03/31/21 1343       Learning Barriers/Preferences   Learning Barriers None    Learning Preferences None             General Pulmonary Education Topics:  Infection Prevention: - Provides verbal and written material to individual with discussion of infection control including proper hand washing and proper equipment cleaning during exercise session. Flowsheet Row Pulmonary Rehab from 05/04/2021 in [facility] Cardiac and Pulmonary Rehab  Education need identified 05/04/21  Date 05/04/21  Educator [place]  Instruction Review Code 1- Verbalizes Understanding       Falls Prevention: - Provides verbal and written material to individual with discussion of falls prevention and safety. Flowsheet Row Pulmonary Rehab from 05/04/2021 in [facility] Cardiac and Pulmonary Rehab  Education need identified 05/04/21  Date  05/04/21  Educator [place]  Instruction Review Code 1- Verbalizes Understanding       Chronic Lung Disease Review: - Group verbal instruction with posters, models, PowerPoint presentations and videos,  to review new updates, new respiratory medications, new advancements in procedures and treatments. Providing information on websites and "800" numbers for continued self-education. Includes information about supplement oxygen, available portable oxygen systems, continuous and intermittent flow rates, oxygen safety, concentrators, and Medicare reimbursement for oxygen. Explanation of Pulmonary Drugs, including class, frequency, complications, importance of spacers, rinsing mouth after steroid MDI's, and proper cleaning methods for nebulizers. Review of basic lung anatomy and physiology related to function, structure, and complications of lung disease. Review of risk factors. Discussion about methods for diagnosing sleep apnea and types of masks and machines for OSA. Includes a review of the use of types of environmental controls: home humidity, furnaces, filters, dust mite/pet prevention, HEPA vacuums. Discussion about weather changes, air quality and the benefits of nasal washing. Instruction on Warning signs, infection symptoms, calling MD promptly, preventive modes, and value of vaccinations. Review of effective airway clearance, coughing and/or vibration techniques. Emphasizing that all should Create an Action Plan. Written material given at graduation. Flowsheet Row Pulmonary Rehab from 05/04/2021 in [facility] Cardiac and Pulmonary Rehab  Education need identified 05/04/21       AED/CPR: - Group verbal and written instruction with the use of models to demonstrate the basic use  of the AED with the basic ABC's of resuscitation.    Anatomy and Cardiac Procedures: - Group verbal and visual presentation and models provide information about basic cardiac anatomy and function. Reviews the testing methods done  to diagnose heart disease and the outcomes of the test results. Describes the treatment choices: Medical Management, Angioplasty, or Coronary Bypass Surgery for treating various heart conditions including Myocardial Infarction, Angina, Valve Disease, and Cardiac Arrhythmias.  Written material given at graduation.   Medication Safety: - Group verbal and visual instruction to review commonly prescribed medications for heart and lung disease. Reviews the medication, class of the drug, and side effects. Includes the steps to properly store meds and maintain the prescription regimen.  Written material given at graduation.   Other: -Provides group and verbal instruction on various topics (see comments)   Knowledge Questionnaire Score:    Core Components/Risk Factors/Patient Goals at Admission:  Personal Goals and Risk Factors at Admission - 05/04/21 1042       Core Components/Risk Factors/Patient Goals on Admission    Weight Management Yes;Weight Loss;Obesity    Intervention Weight Management: Provide education and appropriate resources to help participant work on and attain dietary goals.;Weight Management: Develop a combined nutrition and exercise program designed to reach desired caloric intake, while maintaining appropriate intake of nutrient and fiber, sodium and fats, and appropriate energy expenditure required for the weight goal.    Admit Weight 248 lb (112.5 kg)    Goal Weight: Short Term 242 lb (109.8 kg)    Goal Weight: Long Term 215 lb (97.5 kg)    Expected Outcomes Long Term: Adherence to nutrition and physical activity/exercise program aimed toward attainment of established weight goal;Short Term: Continue to assess and modify interventions until short term weight is achieved;Weight Loss: Understanding of general recommendations for a balanced deficit meal plan, which promotes 1-2 lb weight loss per week and includes a negative energy balance of [phone] kcal/d;Understanding  recommendations for meals to include 15-35% energy as protein, 25-35% energy from fat, 35-60% energy from carbohydrates, less than 260m of dietary cholesterol, 20-35 gm of total fiber daily;Understanding of distribution of calorie intake throughout the day with the consumption of 4-5 meals/snacks    Improve shortness of breath with ADL's Yes    Intervention Provide education, individualized exercise plan and daily activity instruction to help decrease symptoms of SOB with activities of daily living.    Expected Outcomes Short Term: Improve cardiorespiratory fitness to achieve a reduction of symptoms when performing ADLs;Long Term: Be able to perform more ADLs without symptoms or delay the onset of symptoms             Education:Diabetes - Individual verbal and written instruction to review signs/symptoms of diabetes, desired ranges of glucose level fasting, after meals and with exercise. Acknowledge that pre and post exercise glucose checks will be done for 3 sessions at entry of program.   Know Your Numbers and Heart Failure: - Group verbal and visual instruction to discuss disease risk factors for cardiac and pulmonary disease and treatment options.  Reviews associated critical values for Overweight/Obesity, Hypertension, Cholesterol, and Diabetes.  Discusses basics of heart failure: signs/symptoms and treatments.  Introduces Heart Failure Zone chart for action plan for heart failure.  Written material given at graduation.   Core Components/Risk Factors/Patient Goals Review:    Core Components/Risk Factors/Patient Goals at Discharge (Final Review):    ITP Comments:  ITP Comments     Row Name 03/31/21 1342 05/04/21 0956 05/04/21 1351  05/09/21 0750 05/10/21 0703   ITP Comments Initial telephone orientation completed. Diagnosis [facility] 8[facility]. EP orientation scheduled for Tuesday 10/4 at 8am. Completed 6MWT and gym orientation. Initial ITP created and sent for review to Dr.  Ottie Glazier, Medical Director. Spoke with patient regarding importance of wearing O2 at home at rest and during exertion. Patient should be checking O2 levels at home and adhering to staying above 88%. Patient was reminded to bring her O2 with her to rehab. Patient understood. First full day of exercise!  Patient was oriented to gym and equipment including functions, settings, policies, and procedures.  Patient's individual exercise prescription and treatment plan were reviewed.  All starting workloads were established based on the results of the 6 minute walk test done at initial orientation visit.  The plan for exercise progression was also introduced and progression will be customized based on patient's performance and goals. 30 Day review completed. Medical Director ITP review done, changes made as directed, and signed approval by Medical Director.   New to program            Comments:  30 Day review completed. Medical Director ITP review done, changes made as directed, and signed approval by Medical Director.

## 2021-05-12 ENCOUNTER — Other Ambulatory Visit: Payer: Self-pay

## 2021-05-12 ENCOUNTER — Encounter: Payer: Medicaid Other | Admitting: *Deleted

## 2021-05-12 DIAGNOSIS — J849 Interstitial pulmonary disease, unspecified: Secondary | ICD-10-CM

## 2021-05-12 NOTE — Progress Notes (Signed)
Daily Session Note  Patient Details  Name: Rhonda Franco MRN: [id number] Date of Birth: December 11, 2000 Referring Provider:   Flowsheet Row Pulmonary Rehab from 05/04/2021 in [facility] Cardiac and Pulmonary Rehab  Referring Provider Vonita Moss MD       Encounter Date: 05/12/2021  Check In:  Session Check In - 05/12/21 0812       Check-In   Supervising physician immediately available to respond to emergencies See telemetry face sheet for immediately available ER MD    Location [facility]-Cardiac & Pulmonary Rehab    Staff Present Heath Lark, RN, BSN, CCRP;Jessica Hiram, MA, RCEP, CCRP, CCET;Joseph Guinda, [place]    Virtual Visit No    Medication changes reported     No    Fall or balance concerns reported    No    Warm-up and Cool-down Performed on first and last piece of equipment    Resistance Training Performed Yes    VAD Patient? No    PAD/SET Patient? No      Pain Assessment   Currently in Pain? No/denies                Social History   Tobacco Use  Smoking Status Never  Smokeless Tobacco Never    Goals Met:  Proper associated with RPD/PD & O2 Sat Independence with exercise equipment Exercise tolerated well No report of concerns or symptoms today  Goals Unmet:  Not Applicable  Comments: Pt able to follow exercise prescription today without complaint.  Will continue to monitor for progression.    Dr. Emily Filbert is Medical Director for [facility].  Dr. Ottie Glazier is Medical Director for [facility] Pulmonary Rehabilitation.

## 2021-05-16 ENCOUNTER — Other Ambulatory Visit: Payer: Self-pay

## 2021-05-25 ENCOUNTER — Telehealth: Payer: Self-pay
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
## 2021-05-29 ENCOUNTER — Ambulatory Visit: Payer: Medicaid Other

## 2021-05-31 DIAGNOSIS — J849 Interstitial pulmonary disease, unspecified: Secondary | ICD-10-CM

## 2021-05-31 NOTE — Progress Notes (Signed)
Pt has not attended since last review.  She has had transportation issues

## 2021-06-05 ENCOUNTER — Ambulatory Visit: Payer: Medicaid Other

## 2021-06-06 ENCOUNTER — Ambulatory Visit: Payer: Medicaid Other

## 2021-06-07 ENCOUNTER — Telehealth (HOSPITAL_COMMUNITY): Payer: Self-pay

## 2021-06-07 ENCOUNTER — Encounter: Payer: Self-pay | Admitting: *Deleted

## 2021-06-07 ENCOUNTER — Encounter (HOSPITAL_COMMUNITY): Payer: Self-pay

## 2021-06-07 DIAGNOSIS — J849 Interstitial pulmonary disease, unspecified: Secondary | ICD-10-CM

## 2021-06-07 NOTE — Telephone Encounter (Signed)
Received outside referral for pulmonary rehab from [facility].

## 2021-06-07 NOTE — Telephone Encounter (Signed)
Pt called back and stated that she is interested in participating in the pulmonary rehab, I advised pt that we will call her back at a later time to schedule her.

## 2021-06-07 NOTE — Progress Notes (Signed)
Pulmonary Individual Treatment Plan  Patient Details  Name: Rhonda Franco MRN: [id number] Date of Birth: [birth year] Referring Provider:   Flowsheet Row Pulmonary Rehab from 05/04/2021 in [facility] Cardiac and Pulmonary Rehab  Referring Provider Vonita Moss MD       Initial Encounter Date:  Flowsheet Row Pulmonary Rehab from 05/04/2021 in [facility] Cardiac and Pulmonary Rehab  Date 05/04/21       Visit Diagnosis: ILD (interstitial lung disease) ([place])  Patient's Home Medications on Admission:  Current Outpatient Medications:    aspirin EC 81 MG tablet, Take 81 mg by mouth daily., Disp: , Rfl:    atovaquone (MEPRON) 750 MG/5ML suspension, Take by mouth., Disp: , Rfl:    Drospirenone (SLYND) 4 MG TABS, Take 1 tablet by mouth daily., Disp: 168 tablet, Rfl: 0   fluticasone (FLONASE) 50 MCG/ACT nasal spray, Place into both nostrils., Disp: , Rfl:    gabapentin (NEURONTIN) 300 MG capsule, Take 300 mg by mouth daily., Disp: , Rfl:    hydroxychloroquine (PLAQUENIL) 200 MG tablet, Take 1 tablet (200 mg total) by mouth 2 (two) times daily., Disp: 60 tablet, Rfl: 0   MITIGARE 0.6 MG CAPS, Take 0.6 mg by mouth 2 (two) times daily., Disp: , Rfl:    mycophenolate (CELLCEPT) 500 MG tablet, Take 500 mg by mouth 2 (two) times daily. (Patient not taking: Reported on 03/31/2021), Disp: , Rfl:    mycophenolate (MYFORTIC) 360 MG TBEC EC tablet, Take by mouth., Disp: , Rfl:    OZEMPIC, 0.25 OR 0.5 MG/DOSE, 2 MG/1.5ML SOPN, SMARTSIG:0.25 Milligram(s) SUB-Q Once a Week, Disp: , Rfl:    topiramate (TOPAMAX) 50 MG tablet, Take by mouth., Disp: , Rfl:   Past Medical History: Past Medical History:  Diagnosis Date   Anemia    Lupus ([place])    Prediabetes     Tobacco Use: Social History   Tobacco Use  Smoking Status Never  Smokeless Tobacco Never    Labs: Recent Review Flowsheet Data     Labs for ITP Cardiac and Pulmonary Rehab Latest Ref Rng & Units 05/05/2018 09/12/2018 08/17/2019 06/16/2020   Cholestrol  100 - 199 mg/dL 116 - - 161   LDLCALC 0 - 99 mg/dL 69 - - 108(H)   HDL >39 mg/dL 24(L) - - 34(L)   Trlycerides 0 - 149 mg/dL 115(H) - - 100   Hemoglobin A1c 4.8 - 5.6 % 5.8(H) 5.4 5.5 -        Pulmonary Assessment Scores:  Pulmonary Assessment Scores     Row Name 05/04/21 1001         ADL UCSD   ADL Phase Entry     SOB Score total 49     Rest 1     Walk 2     Stairs 4     Bath 3     Dress 3     Shop 3       CAT Score   CAT Score 25       mMRC Score   mMRC Score 3              UCSD: Self-administered rating of dyspnea associated with activities of daily living (ADLs) 6-point scale (0 = "not at all" to 5 = "maximal or unable to do because of breathlessness")  Scoring Scores range from 0 to 120.  Minimally important difference is 5 units  CAT: CAT can identify the health impairment of COPD patients and is better correlated with disease progression.  CAT has  a scoring range of zero to 40. The CAT score is classified into four groups of low (less than 10), medium (10 - 20), high (21-30) and very high (31-40) based on the impact level of disease on health status. A CAT score over 10 suggests significant symptoms.  A worsening CAT score could be explained by an exacerbation, poor medication adherence, poor inhaler technique, or progression of COPD or comorbid conditions.  CAT MCID is 2 points  mMRC: mMRC (Modified Medical Research Council) Dyspnea Scale is used to assess the degree of baseline functional disability in patients of respiratory disease due to dyspnea. No minimal important difference is established. A decrease in score of 1 point or greater is considered a positive change.   Pulmonary Function Assessment:   Exercise Target Goals: Exercise Program Goal: Individual exercise prescription set using results from initial 6 min walk test and THRR while considering  patient's activity barriers and safety.   Exercise Prescription Goal: Initial exercise  prescription builds to 30-45 minutes a day of aerobic activity, 2-3 days per week.  Home exercise guidelines will be given to patient during program as part of exercise prescription that the participant will acknowledge.  Education: Aerobic Exercise: - Group verbal and visual presentation on the components of exercise prescription. Introduces F.I.T.T principle from ACSM for exercise prescriptions.  Reviews F.I.T.T. principles of aerobic exercise including progression. Written material given at graduation.   Education: Resistance Exercise: - Group verbal and visual presentation on the components of exercise prescription. Introduces F.I.T.T principle from ACSM for exercise prescriptions  Reviews F.I.T.T. principles of resistance exercise including progression. Written material given at graduation.    Education: Exercise & Equipment Safety: - Individual verbal instruction and demonstration of equipment use and safety with use of the equipment. Flowsheet Row Pulmonary Rehab from 05/04/2021 in [facility] Cardiac and Pulmonary Rehab  Education need identified 05/04/21  Date 05/04/21  Educator [place]  Instruction Review Code 1- Verbalizes Understanding       Education: Exercise Physiology & General Exercise Guidelines: - Group verbal and written instruction with models to review the exercise physiology of the cardiovascular system and associated critical values. Provides general exercise guidelines with specific guidelines to those with heart or lung disease.    Education: Flexibility, Balance, Mind/Body Relaxation: - Group verbal and visual presentation with interactive activity on the components of exercise prescription. Introduces F.I.T.T principle from ACSM for exercise prescriptions. Reviews F.I.T.T. principles of flexibility and balance exercise training including progression. Also discusses the mind body connection.  Reviews various relaxation techniques to help reduce and manage stress (i.e. Deep  breathing, progressive muscle relaxation, and visualization). Balance handout provided to take home. Written material given at graduation.   Activity Barriers & Risk Stratification:  Activity Barriers & Cardiac Risk Stratification - 05/04/21 1026       Activity Barriers & Cardiac Risk Stratification   Activity Barriers Joint Problems;Deconditioning;Muscular Weakness;Shortness of Breath             6 Minute Walk:  6 Minute Walk     Row Name 05/04/21 1019         6 Minute Walk   Phase Initial     Distance 765 feet     Walk Time 5.75 minutes  Stopped at 5:30 min due to O2 sat     # of Rest Breaks 0     MPH 1.51     METS 4.27     RPE 10     Perceived Dyspnea  3  VO2 Peak 14.97     Symptoms Yes (comment)     Comments SOB, chest tightness     Resting HR 107 bpm     Resting BP 118/76     Resting Oxygen Saturation  99 %     Exercise Oxygen Saturation  during 6 min walk 80 %     Max Ex. HR 146 bpm     Max Ex. BP 126/80     2 Minute Post BP 120/80       Interval HR   1 Minute HR 122     2 Minute HR 132     3 Minute HR 136     4 Minute HR 143     5 Minute HR 145     6 Minute HR 146     2 Minute Post HR 126     Interval Heart Rate? Yes       Interval Oxygen   Interval Oxygen? Yes     Baseline Oxygen Saturation % 99 %     1 Minute Oxygen Saturation % 94 %     1 Minute Liters of Oxygen 6 L     2 Minute Oxygen Saturation % 92 %     2 Minute Liters of Oxygen 6 L     3 Minute Oxygen Saturation % 88 %     3 Minute Liters of Oxygen 6 L     4 Minute Oxygen Saturation % 87 %     4 Minute Liters of Oxygen 6 L     5 Minute Oxygen Saturation % 83 %     5 Minute Liters of Oxygen 6 L     6 Minute Oxygen Saturation % 80 %     6 Minute Liters of Oxygen 6 L     2 Minute Post Oxygen Saturation % 96 %     2 Minute Post Liters of Oxygen 6 L             Oxygen Initial Assessment:  Oxygen Initial Assessment - 05/04/21 1000       Home Oxygen   Home Oxygen Device Home  Concentrator;E-Tanks    Sleep Oxygen Prescription None   she is being evaluated to see if she needs O2 at night per patient   Home Exercise Oxygen Prescription Continuous    Liters per minute 6    Home Resting Oxygen Prescription Continuous   as needed per patient   Liters per minute 6    Compliance with Home Oxygen Use No      Initial 6 min Walk   Oxygen Used Continuous    Liters per minute 6      Program Oxygen Prescription   Program Oxygen Prescription Continuous    Liters per minute 6      Intervention   Short Term Goals To learn and understand importance of monitoring SPO2 with pulse oximeter and demonstrate accurate use of the pulse oximeter.;To learn and understand importance of maintaining oxygen saturations>88%;To learn and demonstrate proper pursed lip breathing techniques or other breathing techniques. ;To learn and demonstrate proper use of respiratory medications;To learn and exhibit compliance with exercise, home and travel O2 prescription    Long  Term Goals Verbalizes importance of monitoring SPO2 with pulse oximeter and return demonstration;Maintenance of O2 saturations>88%;Exhibits proper breathing techniques, such as pursed lip breathing or other method taught during program session;Compliance with respiratory medication;Demonstrates proper use of MDI's;Exhibits compliance with exercise, home  and travel O2  prescription             Oxygen Re-Evaluation:  Oxygen Re-Evaluation     Row Name 05/09/21 0751             Program Oxygen Prescription   Program Oxygen Prescription Continuous       Liters per minute 6         Home Oxygen   Home Oxygen Device Home Concentrator;E-Tanks       Sleep Oxygen Prescription None       Home Exercise Oxygen Prescription Continuous       Liters per minute 6       Home Resting Oxygen Prescription Continuous       Compliance with Home Oxygen Use No         Goals/Expected Outcomes   Short Term Goals To learn and understand  importance of monitoring SPO2 with pulse oximeter and demonstrate accurate use of the pulse oximeter.;To learn and understand importance of maintaining oxygen saturations>88%;To learn and demonstrate proper pursed lip breathing techniques or other breathing techniques. ;To learn and exhibit compliance with exercise, home and travel O2 prescription       Long  Term Goals Verbalizes importance of monitoring SPO2 with pulse oximeter and return demonstration;Maintenance of O2 saturations>88%;Exhibits proper breathing techniques, such as pursed lip breathing or other method taught during program session;Compliance with respiratory medication;Exhibits compliance with exercise, home  and travel O2 prescription       Comments Reviewed PLB technique with pt.  Talked about how it works and it's importance in maintaining their exercise saturations.       Goals/Expected Outcomes Short: Become more profiecient at using PLB.   Long: Become independent at using PLB.                Oxygen Discharge (Final Oxygen Re-Evaluation):  Oxygen Re-Evaluation - 05/09/21 0751       Program Oxygen Prescription   Program Oxygen Prescription Continuous    Liters per minute 6      Home Oxygen   Home Oxygen Device Home Concentrator;E-Tanks    Sleep Oxygen Prescription None    Home Exercise Oxygen Prescription Continuous    Liters per minute 6    Home Resting Oxygen Prescription Continuous    Compliance with Home Oxygen Use No      Goals/Expected Outcomes   Short Term Goals To learn and understand importance of monitoring SPO2 with pulse oximeter and demonstrate accurate use of the pulse oximeter.;To learn and understand importance of maintaining oxygen saturations>88%;To learn and demonstrate proper pursed lip breathing techniques or other breathing techniques. ;To learn and exhibit compliance with exercise, home and travel O2 prescription    Long  Term Goals Verbalizes importance of monitoring SPO2 with pulse oximeter  and return demonstration;Maintenance of O2 saturations>88%;Exhibits proper breathing techniques, such as pursed lip breathing or other method taught during program session;Compliance with respiratory medication;Exhibits compliance with exercise, home  and travel O2 prescription    Comments Reviewed PLB technique with pt.  Talked about how it works and it's importance in maintaining their exercise saturations.    Goals/Expected Outcomes Short: Become more profiecient at using PLB.   Long: Become independent at using PLB.             Initial Exercise Prescription:  Initial Exercise Prescription - 05/04/21 1000       Date of Initial Exercise RX and Referring Provider   Date 05/04/21    Referring Provider Vonita Moss MD  Oxygen   Oxygen Continuous    Liters 6    Maintain Oxygen Saturation 88% or higher      Treadmill   MPH 1.3    Grade 0    Minutes 15    METs 2      Recumbant Bike   Level 1    RPM 60    Minutes 15    METs 4      NuStep   Level 1    SPM 80    Minutes 15    METs 4      REL-XR   Level 1    Speed 50    Minutes 15    METs 4      Prescription Details   Frequency (times per week) 3    Duration Progress to 30 minutes of continuous aerobic without signs/symptoms of physical distress      Intensity   THRR 40-80% of Max Heartrate 143-180    Ratings of Perceived Exertion 11-13    Perceived Dyspnea 0-4      Progression   Progression Continue to progress workloads to maintain intensity without signs/symptoms of physical distress.      Resistance Training   Training Prescription Yes    Weight 3 lb    Reps 10-15             Perform Capillary Blood Glucose checks as needed.  Exercise Prescription Changes:   Exercise Prescription Changes     Row Name 05/04/21 1000 05/16/21 1400           Response to Exercise   Blood Pressure (Admit) 118/76 124/76      Blood Pressure (Exercise) 126/80 122/60      Blood Pressure (Exit) 120/80 122/62       Heart Rate (Admit) 107 bpm 104 bpm      Heart Rate (Exercise) 146 bpm 127 bpm      Heart Rate (Exit) 110 bpm 104 bpm      Oxygen Saturation (Admit) 99 % 98 %      Oxygen Saturation (Exercise) 80 % 92 %      Oxygen Saturation (Exit) 99 % 97 %      Rating of Perceived Exertion (Exercise) 10 12      Perceived Dyspnea (Exercise) 3 2      Symptoms SOB, chest tightness SOB      Comments walk test results second full day of exercise      Duration -- Progress to 30 minutes of  aerobic without signs/symptoms of physical distress      Intensity -- THRR unchanged        Progression   Progression -- Continue to progress workloads to maintain intensity without signs/symptoms of physical distress.      Average METs -- 1.93        Resistance Training   Training Prescription -- Yes      Weight -- 3 lb      Reps -- 10-15        Interval Training   Interval Training -- No        Oxygen   Oxygen -- Continuous      Liters -- 4  6L on treadmill        Treadmill   MPH -- 1.3      Grade -- 0      Minutes -- 15      METs -- 1.99        Recumbant Bike  Level -- 1.5      Minutes -- 15        NuStep   Level -- 1      Minutes -- 15      METs -- 2        REL-XR   Level -- 1      Minutes -- 15      METs -- 1.8        Oxygen   Maintain Oxygen Saturation -- 88% or higher               Exercise Comments:   Exercise Comments     Row Name 05/09/21 0750           Exercise Comments First full day of exercise!  Patient was oriented to gym and equipment including functions, settings, policies, and procedures.  Patient's individual exercise prescription and treatment plan were reviewed.  All starting workloads were established based on the results of the 6 minute walk test done at initial orientation visit.  The plan for exercise progression was also introduced and progression will be customized based on patient's performance and goals.                Exercise Goals and  Review:   Exercise Goals     Row Name 05/04/21 1041             Exercise Goals   Increase Physical Activity Yes       Intervention Provide advice, education, support and counseling about physical activity/exercise needs.;Develop an individualized exercise prescription for aerobic and resistive training based on initial evaluation findings, risk stratification, comorbidities and participant's personal goals.       Expected Outcomes Short Term: Attend rehab on a regular basis to increase amount of physical activity.;Long Term: Exercising regularly at least 3-5 days a week.;Long Term: Add in home exercise to make exercise part of routine and to increase amount of physical activity.       Increase Strength and Stamina Yes       Intervention Provide advice, education, support and counseling about physical activity/exercise needs.;Develop an individualized exercise prescription for aerobic and resistive training based on initial evaluation findings, risk stratification, comorbidities and participant's personal goals.       Expected Outcomes Short Term: Increase workloads from initial exercise prescription for resistance, speed, and METs.;Short Term: Perform resistance training exercises routinely during rehab and add in resistance training at home;Long Term: Improve cardiorespiratory fitness, muscular endurance and strength as measured by increased METs and functional capacity (6MWT)       Able to understand and use rate of perceived exertion (RPE) scale Yes       Intervention Provide education and explanation on how to use RPE scale       Expected Outcomes Short Term: Able to use RPE daily in rehab to express subjective intensity level;Long Term:  Able to use RPE to guide intensity level when exercising independently       Able to understand and use Dyspnea scale Yes       Intervention Provide education and explanation on how to use Dyspnea scale       Expected Outcomes Short Term: Able to use  Dyspnea scale daily in rehab to express subjective sense of shortness of breath during exertion;Long Term: Able to use Dyspnea scale to guide intensity level when exercising independently       Knowledge and understanding of Target Heart Rate Range (THRR) Yes  Intervention Provide education and explanation of THRR including how the numbers were predicted and where they are located for reference       Expected Outcomes Short Term: Able to state/look up THRR;Long Term: Able to use THRR to govern intensity when exercising independently;Short Term: Able to use daily as guideline for intensity in rehab       Able to check pulse independently Yes       Intervention Provide education and demonstration on how to check pulse in carotid and radial arteries.;Review the importance of being able to check your own pulse for safety during independent exercise       Expected Outcomes Short Term: Able to explain why pulse checking is important during independent exercise;Long Term: Able to check pulse independently and accurately       Understanding of Exercise Prescription Yes       Intervention Provide education, explanation, and written materials on patient's individual exercise prescription       Expected Outcomes Short Term: Able to explain program exercise prescription;Long Term: Able to explain home exercise prescription to exercise independently                Exercise Goals Re-Evaluation :  Exercise Goals Re-Evaluation     Row Name 05/09/21 0751 05/16/21 1414           Exercise Goal Re-Evaluation   Exercise Goals Review Increase Physical Activity;Able to understand and use rate of perceived exertion (RPE) scale;Knowledge and understanding of Target Heart Rate Range (THRR);Understanding of Exercise Prescription;Increase Strength and Stamina;Able to understand and use Dyspnea scale;Able to check pulse independently Increase Physical Activity;Increase Strength and Stamina;Understanding of  Exercise Prescription      Comments Reviewed RPE and dyspnea scales, THR and program prescription with pt today.  Pt voiced understanding and was given a copy of goals to take home. Rhonda Franco is off to a good start in rehab.  She has completed her first two full days of exercise.  We will continue to monitor her progress.      Expected Outcomes Short: Use RPE daily to regulate intensity. Long: Follow program prescription in THR. Short: attend rehab regularly Long: Conitnue to follow program prescription.               Discharge Exercise Prescription (Final Exercise Prescription Changes):  Exercise Prescription Changes - 05/16/21 1400       Response to Exercise   Blood Pressure (Admit) 124/76    Blood Pressure (Exercise) 122/60    Blood Pressure (Exit) 122/62    Heart Rate (Admit) 104 bpm    Heart Rate (Exercise) 127 bpm    Heart Rate (Exit) 104 bpm    Oxygen Saturation (Admit) 98 %    Oxygen Saturation (Exercise) 92 %    Oxygen Saturation (Exit) 97 %    Rating of Perceived Exertion (Exercise) 12    Perceived Dyspnea (Exercise) 2    Symptoms SOB    Comments second full day of exercise    Duration Progress to 30 minutes of  aerobic without signs/symptoms of physical distress    Intensity THRR unchanged      Progression   Progression Continue to progress workloads to maintain intensity without signs/symptoms of physical distress.    Average METs 1.93      Resistance Training   Training Prescription Yes    Weight 3 lb    Reps 10-15      Interval Training   Interval Training No  Oxygen   Oxygen Continuous    Liters 4   6L on treadmill     Treadmill   MPH 1.3    Grade 0    Minutes 15    METs 1.99      Recumbant Bike   Level 1.5    Minutes 15      NuStep   Level 1    Minutes 15    METs 2      REL-XR   Level 1    Minutes 15    METs 1.8      Oxygen   Maintain Oxygen Saturation 88% or higher             Nutrition:  Target Goals: Understanding of  nutrition guidelines, daily intake of sodium <1585m, cholesterol <2055m calories 30% from fat and 7% or less from saturated fats, daily to have 5 or more servings of fruits and vegetables.  Education: All About Nutrition: -Group instruction provided by verbal, written material, interactive activities, discussions, models, and posters to present general guidelines for heart healthy nutrition including fat, fiber, MyPlate, the role of sodium in heart healthy nutrition, utilization of the nutrition label, and utilization of this knowledge for meal planning. Follow up email sent as well. Written material given at graduation. [facility]  Education need identified 05/04/21       Biometrics:  Pre Biometrics - 05/04/21 1026       Pre Biometrics   Height 5' 5.75" (1.67 m)    Weight 248 lb 9.6 oz (112.8 kg)    BMI (Calculated) 40.43    Single Leg Stand 3.01 seconds              Nutrition Therapy Plan and Nutrition Goals:  Nutrition Therapy & Goals - 05/04/21 0954       Personal Nutrition Goals   Comments Rhonda Franco stated she is looking forward to getting nutrition help. She has lost her appetite recently and is dropping weight.      Intervention Plan   Intervention Prescribe, educate and counsel regarding individualized specific dietary modifications aiming towards targeted core components such as weight, hypertension, lipid management, diabetes, heart failure and other comorbidities.    Expected Outcomes Short Term Goal: Understand basic principles of dietary content, such as calories, fat, sodium, cholesterol and nutrients.;Long Term Goal: Adherence to prescribed nutrition plan.             Nutrition Assessments:  MEDIFICTS Score Key: ?70 Need to make dietary changes  40-70 Heart Healthy Diet ? 40 Therapeutic Level Cholesterol Diet  [facility]  Picture Your Plate Total Score on Admission 54      Picture Your Plate Scores: <4<97nhealthy dietary pattern with much room for improvement. 41-50 Dietary pattern unlikely to meet recommendations for good health and room for improvement. 51-60 More healthful dietary pattern, with some room for improvement.  >60 Healthy dietary pattern, although there may be some specific behaviors that could be improved.   Nutrition Goals Re-Evaluation:   Nutrition Goals Discharge (Final Nutrition Goals Re-Evaluation):   Psychosocial: Target Goals: Acknowledge presence or absence of significant depression and/or stress, maximize coping skills, provide positive support system. Participant is able to verbalize types and ability to use techniques and skills needed for reducing stress and depression.   Education: Stress, Anxiety, and Depression - Group verbal and visual presentation to define topics  covered.  Reviews how body is impacted by stress, anxiety, and depression.  Also discusses healthy ways to reduce stress and to treat/manage anxiety and depression.  Written material given at graduation.   Education: Sleep Hygiene -Provides group verbal and written instruction about how sleep can affect your health.  Define sleep hygiene, discuss sleep cycles and impact of sleep habits. Review good sleep hygiene tips.    Initial Review & Psychosocial Screening:  Initial Psych Review & Screening - 03/31/21 1333       Initial Review   Current issues with Current Stress Concerns    Source of Stress Concerns Chronic Illness      Family Dynamics   Good Support System? Yes   mom, friends     Barriers   Psychosocial barriers to participate in program There are no identifiable barriers or psychosocial needs.;The patient should benefit from training in stress management and relaxation.      Screening Interventions   Interventions Encouraged to exercise;To provide support and resources with  identified psychosocial needs;Provide feedback about the scores to participant    Expected Outcomes Short Term goal: Utilizing psychosocial counselor, staff and physician to assist with identification of specific Stressors or current issues interfering with healing process. Setting desired goal for each stressor or current issue identified.;Long Term Goal: Stressors or current issues are controlled or eliminated.;Short Term goal: Identification and review with participant of any Quality of Life or Depression concerns found by scoring the questionnaire.;Long Term goal: The participant improves quality of Life and PHQ9 Scores as seen by post scores and/or verbalization of changes             Quality of Life Scores:  Scores of 19 and below usually indicate a poorer quality of life in these areas.  A difference of  2-3 points is a clinically meaningful difference.  A difference of 2-3 points in the total score of the Quality of Life Index has been associated with significant improvement in overall quality of life, self-image, physical symptoms, and general health in studies assessing change in quality of life.  PHQ-9: Recent Review Flowsheet Data     Depression screen [facility] 2/9 05/04/2021 06/16/2020 08/17/2019 07/31/2019 09/12/2018   Decreased Interest 0 1 0 0 0   Down, Depressed, Hopeless 1 0 0 0 1   PHQ - 2 Score 1 1 0 0 1   Altered sleeping 1 - - - 1   Tired, decreased energy 2 - - - 1   Change in appetite 3 - - - 1   Feeling bad or failure about yourself  0 - - - 0   Trouble concentrating 0 - - - 0   Moving slowly or fidgety/restless 0 - - - 0   Suicidal thoughts 0 - - - 0   PHQ-9 Score 7 - - - 4   Difficult doing work/chores Somewhat difficult - - - -      Interpretation of Total Score  Total Score Depression Severity:  1-4 = Minimal depression, 5-9 = Mild depression, 10-14 = Moderate depression, 15-19 = Moderately severe depression, 20-27 = Severe depression   Psychosocial Evaluation  and Intervention:  Psychosocial Evaluation - 03/31/21 1344       Psychosocial Evaluation & Interventions   Interventions Encouraged to exercise with the program and follow exercise prescription;Stress management education    Comments Rhonda Franco was diagnosed with Lupus 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 She has been placed on oxygen and wears it  when needed. Her breathing has gotten worse recently which is why she has been referred. Her mother is her main support system. Whenever she tries to be active, her breathing gets in the way. She is hoping this program will help with how she is feeling and prevent future issues    Expected Outcomes Short: attend pulmonary rehab for education and exercise. Long: develop and maintain positive self care habits.    Continue Psychosocial Services  Follow up required by staff             Psychosocial Re-Evaluation:   Psychosocial Discharge (Final Psychosocial Re-Evaluation):   Education: Education Goals: Education classes will be provided on a weekly basis, covering required topics. Participant will state understanding/return demonstration of topics presented.  Learning Barriers/Preferences:  Learning Barriers/Preferences - 03/31/21 1343       Learning Barriers/Preferences   Learning Barriers None    Learning Preferences None             General Pulmonary Education Topics:  Infection Prevention: - Provides verbal and written material to individual with discussion of infection control including proper hand washing and proper equipment cleaning during exercise session. Flowsheet Row Pulmonary Rehab from 05/04/2021 in [facility] Cardiac and Pulmonary Rehab  Education need identified 05/04/21  Date 05/04/21  Educator [place]  Instruction Review Code 1- Verbalizes Understanding       Falls Prevention: - Provides verbal and written material to individual with discussion of falls prevention and safety. Flowsheet Row Pulmonary Rehab from  05/04/2021 in [facility] Cardiac and Pulmonary Rehab  Education need identified 05/04/21  Date 05/04/21  Educator [place]  Instruction Review Code 1- Verbalizes Understanding       Chronic Lung Disease Review: - Group verbal instruction with posters, models, PowerPoint presentations and videos,  to review new updates, new respiratory medications, new advancements in procedures and treatments. Providing information on websites and "800" numbers for continued self-education. Includes information about supplement oxygen, available portable oxygen systems, continuous and intermittent flow rates, oxygen safety, concentrators, and Medicare reimbursement for oxygen. Explanation of Pulmonary Drugs, including class, frequency, complications, importance of spacers, rinsing mouth after steroid MDI's, and proper cleaning methods for nebulizers. Review of basic lung anatomy and physiology related to function, structure, and complications of lung disease. Review of risk factors. Discussion about methods for diagnosing sleep apnea and types of masks and machines for OSA. Includes a review of the use of types of environmental controls: home humidity, furnaces, filters, dust mite/pet prevention, HEPA vacuums. Discussion about weather changes, air quality and the benefits of nasal washing. Instruction on Warning signs, infection symptoms, calling MD promptly, preventive modes, and value of vaccinations. Review of effective airway clearance, coughing and/or vibration techniques. Emphasizing that all should Create an Action Plan. Written material given at graduation. Flowsheet Row Pulmonary Rehab from 05/04/2021 in [facility] Cardiac and Pulmonary Rehab  Education need identified 05/04/21       AED/CPR: - Group verbal and written instruction with the use of models to demonstrate the basic use of the AED with the basic ABC's of resuscitation.    Anatomy and Cardiac Procedures: - Group verbal and visual presentation and models  provide information about basic cardiac anatomy and function. Reviews the testing methods done to diagnose heart disease and the outcomes of the test results. Describes the treatment choices: Medical Management, Angioplasty, or Coronary Bypass Surgery for treating various heart conditions including Myocardial Infarction, Angina, Valve Disease, and Cardiac Arrhythmias.  Written material given at graduation.   Medication  Safety: - Group verbal and visual instruction to review commonly prescribed medications for heart and lung disease. Reviews the medication, class of the drug, and side effects. Includes the steps to properly store meds and maintain the prescription regimen.  Written material given at graduation.   Other: -Provides group and verbal instruction on various topics (see comments)   Knowledge Questionnaire Score:    Core Components/Risk Factors/Patient Goals at Admission:  Personal Goals and Risk Factors at Admission - 05/04/21 1042       Core Components/Risk Factors/Patient Goals on Admission    Weight Management Yes;Weight Loss;Obesity    Intervention Weight Management: Provide education and appropriate resources to help participant work on and attain dietary goals.;Weight Management: Develop a combined nutrition and exercise program designed to reach desired caloric intake, while maintaining appropriate intake of nutrient and fiber, sodium and fats, and appropriate energy expenditure required for the weight goal.    Admit Weight 248 lb (112.5 kg)    Goal Weight: Short Term 242 lb (109.8 kg)    Goal Weight: Long Term 215 lb (97.5 kg)    Expected Outcomes Long Term: Adherence to nutrition and physical activity/exercise program aimed toward attainment of established weight goal;Short Term: Continue to assess and modify interventions until short term weight is achieved;Weight Loss: Understanding of general recommendations for a balanced deficit meal plan, which promotes 1-2 lb weight  loss per week and includes a negative energy balance of [id number] kcal/d;Understanding recommendations for meals to include 15-35% energy as protein, 25-35% energy from fat, 35-60% energy from carbohydrates, less than 240m of dietary cholesterol, 20-35 gm of total fiber daily;Understanding of distribution of calorie intake throughout the day with the consumption of 4-5 meals/snacks    Improve shortness of breath with ADL's Yes    Intervention Provide education, individualized exercise plan and daily activity instruction to help decrease symptoms of SOB with activities of daily living.    Expected Outcomes Short Term: Improve cardiorespiratory fitness to achieve a reduction of symptoms when performing ADLs;Long Term: Be able to perform more ADLs without symptoms or delay the onset of symptoms             Education:Diabetes - Individual verbal and written instruction to review signs/symptoms of diabetes, desired ranges of glucose level fasting, after meals and with exercise. Acknowledge that pre and post exercise glucose checks will be done for 3 sessions at entry of program.   Know Your Numbers and Heart Failure: - Group verbal and visual instruction to discuss disease risk factors for cardiac and pulmonary disease and treatment options.  Reviews associated critical values for Overweight/Obesity, Hypertension, Cholesterol, and Diabetes.  Discusses basics of heart failure: signs/symptoms and treatments.  Introduces Heart Failure Zone chart for action plan for heart failure.  Written material given at graduation.   Core Components/Risk Factors/Patient Goals Review:    Core Components/Risk Factors/Patient Goals at Discharge (Final Review):    ITP Comments:  ITP Comments     Row Name 03/31/21 1342 05/04/21 0956 05/04/21 1351 05/09/21 0750 05/10/21 0703   ITP Comments Initial telephone orientation completed. [facility] EP orientation scheduled for Tuesday 10/4 at 8am.  Completed 6MWT and gym orientation. Initial ITP created and sent for review to Dr. FOttie Glazier Medical Director. Spoke with patient regarding importance of wearing O2 at home at rest and during exertion. Patient should be checking O2 levels at home and adhering to staying above 88%. Patient was reminded to bring her O2 with  her to rehab. Patient understood. First full day of exercise!  Patient was oriented to gym and equipment including functions, settings, policies, and procedures.  Patient's individual exercise prescription and treatment plan were reviewed.  All starting workloads were established based on the results of the 6 minute walk test done at initial orientation visit.  The plan for exercise progression was also introduced and progression will be customized based on patient's performance and goals. 30 Day review completed. Medical Director ITP review done, changes made as directed, and signed approval by Medical Director.   New to program    Row Name 05/31/21 1502 06/07/21 0650         ITP Comments Pt has not attended since last review.  She has had transportation issues 30 Day review completed. Medical Director ITP review done, changes made as directed, and signed approval by Medical Director.               Comments:

## 2021-06-07 NOTE — Telephone Encounter (Signed)
Office referral received for pulmonary rehab, printed referral and given to RN to review

## 2021-06-07 NOTE — Telephone Encounter (Signed)
Attempted to contact pt to see if she is interested in the pulmonary rehab program, [facility] and mailed letter.

## 2021-06-08 ENCOUNTER — Ambulatory Visit: Payer: Medicaid Other

## 2021-06-08 NOTE — Telephone Encounter (Signed)
Pt called back and stated that she is unable to travel to [facility] for pulmonary rehab and that she would like to complete her pulmonary rehab here at [facility]. I advised pt that once her information is reviewed by our nurse navigator we will give her a call at a later date to schedule.

## 2021-06-08 NOTE — Telephone Encounter (Signed)
Janett Billow from [facility] pulmonary rehab called and stated that pt is enrolled in their program and they noticed that I spoke with pt yesterday. Bennie Pierini that we received a referral for pt from [facility] to participate in the pulmonary rehab. Janett Billow stated that pt has not been attending the program and only have came to a couple of sessions and she stated that she is unable to reach pt. I called pt and left a message for pt to call back to see if she wanted to continue with [place]. Pt didn't mention yesterday that she is enrolled in the pulmonary rehab program at [facility]. Pt did state yesterday that she is interested in the pulmonary rehab program here at [facility]. Called pt and left a message.

## 2021-06-12 ENCOUNTER — Telehealth: Payer: Self-pay

## 2021-06-12 ENCOUNTER — Ambulatory Visit: Payer: Medicaid Other

## 2021-06-12 DIAGNOSIS — J849 Interstitial pulmonary disease, unspecified: Secondary | ICD-10-CM

## 2021-06-12 NOTE — Telephone Encounter (Signed)
Left message with patient asking for callback- looking to verify if patient will continue Pulmonary Rehab with [place] or would like to switch to [facility] as she received a new referral there.

## 2021-06-13 ENCOUNTER — Ambulatory Visit: Payer: Medicaid Other

## 2021-06-13 ENCOUNTER — Encounter (HOSPITAL_COMMUNITY): Payer: Self-pay | Admitting: *Deleted

## 2021-06-13 NOTE — Progress Notes (Signed)
Received another referral notification from Dr. Maceo Pro at [facility] for this pt to participate in pulmonary rehab with the diagnosis of ILD.  This pt was previously referred back in May of this year and scheduled for an orientation appt.for June.  Pt did not show for this appt and attempts to contact pt for reschedule by phone and letter  yielded no response  Able to reach pt in July and she indicated that she is no longer interested due to her work schedule.  It has come to my understanding that this pt is enrolled at [facility] pulmonary rehab program however has only completed 3 sessions with the last one on 10/27 05/12/21.  Multiple attempts by [facility] staff to reach pt remained unanswered.  Support staff here at [facility] did reach pt (after leaving voicemail)Per pt she is experiencing transportation issues and would like to attend at [facility]. Asked pt to please contact [facility] to let them know about her inability to participate due to transportation and her desire to participate here at [facility].  Pt made aware that we here at [facility] have a wait list of 2-3 months. Clinical review of pt follow up appt on 05/18/21. Pulmonary office note.  Pt with Covid Risk Score - 2. Will send referral for MD signature to Dr. Maceo Pro once received back.  Pt appropriate for scheduling for Pulmonary rehab.  Will forward to support staff for scheduling and verification of insurance eligibility/benefits with pt consent. Cherre Huger, BSN Cardiac and Training and development officer

## 2021-06-15 ENCOUNTER — Encounter: Payer: Self-pay | Admitting: *Deleted

## 2021-06-15 ENCOUNTER — Ambulatory Visit: Payer: Medicaid Other

## 2021-06-15 NOTE — Progress Notes (Signed)
Pulmonary Individual Treatment Plan  Patient Details  Name: Rhonda Franco MRN: [id number] Date of Birth: 2001-03-25 Referring Provider:   Flowsheet Row Pulmonary Rehab from 05/04/2021 in [facility] Cardiac and Pulmonary Rehab  Referring Provider Vonita Moss MD       Initial Encounter Date:  Flowsheet Row Pulmonary Rehab from 05/04/2021 in [facility] Cardiac and Pulmonary Rehab  Date 05/04/21       Visit Diagnosis: No diagnosis found.  Patient's Home Medications on Admission:  Current Outpatient Medications:    aspirin EC 81 MG tablet, Take 81 mg by mouth daily., Disp: , Rfl:    atovaquone (MEPRON) 750 MG/5ML suspension, Take by mouth., Disp: , Rfl:    Drospirenone (SLYND) 4 MG TABS, Take 1 tablet by mouth daily., Disp: 168 tablet, Rfl: 0   fluticasone (FLONASE) 50 MCG/ACT nasal spray, Place into both nostrils., Disp: , Rfl:    gabapentin (NEURONTIN) 300 MG capsule, Take 300 mg by mouth daily., Disp: , Rfl:    hydroxychloroquine (PLAQUENIL) 200 MG tablet, Take 1 tablet (200 mg total) by mouth 2 (two) times daily., Disp: 60 tablet, Rfl: 0   MITIGARE 0.6 MG CAPS, Take 0.6 mg by mouth 2 (two) times daily., Disp: , Rfl:    mycophenolate (CELLCEPT) 500 MG tablet, Take 500 mg by mouth 2 (two) times daily. (Patient not taking: Reported on 03/31/2021), Disp: , Rfl:    mycophenolate (MYFORTIC) 360 MG TBEC EC tablet, Take by mouth., Disp: , Rfl:    OZEMPIC, 0.25 OR 0.5 MG/DOSE, 2 MG/1.5ML SOPN, SMARTSIG:0.25 Milligram(s) SUB-Q Once a Week, Disp: , Rfl:    topiramate (TOPAMAX) 50 MG tablet, Take by mouth., Disp: , Rfl:   Past Medical History: Past Medical History:  Diagnosis Date   Anemia    Lupus ([place])    Prediabetes     Tobacco Use: Social History   Tobacco Use  Smoking Status Never  Smokeless Tobacco Never    Labs: Recent Review Flowsheet Data     Labs for ITP Cardiac and Pulmonary Rehab Latest Ref Rng & Units 05/05/2018 09/12/2018 08/17/2019 06/16/2020   Cholestrol 100 - 199 mg/dL  116 - - 161   LDLCALC 0 - 99 mg/dL 69 - - 108(H)   HDL >39 mg/dL 24(L) - - 34(L)   Trlycerides 0 - 149 mg/dL 115(H) - - 100   Hemoglobin A1c 4.8 - 5.6 % 5.8(H) 5.4 5.5 -        Pulmonary Assessment Scores:  Pulmonary Assessment Scores     Row Name 05/04/21 1001         ADL UCSD   ADL Phase Entry     SOB Score total 49     Rest 1     Walk 2     Stairs 4     Bath 3     Dress 3     Shop 3       CAT Score   CAT Score 25       mMRC Score   mMRC Score 3              UCSD: Self-administered rating of dyspnea associated with activities of daily living (ADLs) 6-point scale (0 = "not at all" to 5 = "maximal or unable to do because of breathlessness")  Scoring Scores range from 0 to 120.  Minimally important difference is 5 units  CAT: CAT can identify the health impairment of COPD patients and is better correlated with disease progression.  CAT has a scoring  range of zero to 40. The CAT score is classified into four groups of low (less than 10), medium (10 - 20), high (21-30) and very high (31-40) based on the impact level of disease on health status. A CAT score over 10 suggests significant symptoms.  A worsening CAT score could be explained by an exacerbation, poor medication adherence, poor inhaler technique, or progression of COPD or comorbid conditions.  CAT MCID is 2 points  mMRC: mMRC (Modified Medical Research Council) Dyspnea Scale is used to assess the degree of baseline functional disability in patients of respiratory disease due to dyspnea. No minimal important difference is established. A decrease in score of 1 point or greater is considered a positive change.   Pulmonary Function Assessment:   Exercise Target Goals: Exercise Program Goal: Individual exercise prescription set using results from initial 6 min walk test and THRR while considering  patient's activity barriers and safety.   Exercise Prescription Goal: Initial exercise prescription builds to  30-45 minutes a day of aerobic activity, 2-3 days per week.  Home exercise guidelines will be given to patient during program as part of exercise prescription that the participant will acknowledge.  Education: Aerobic Exercise: - Group verbal and visual presentation on the components of exercise prescription. Introduces F.I.T.T principle from ACSM for exercise prescriptions.  Reviews F.I.T.T. principles of aerobic exercise including progression. Written material given at graduation.   Education: Resistance Exercise: - Group verbal and visual presentation on the components of exercise prescription. Introduces F.I.T.T principle from ACSM for exercise prescriptions  Reviews F.I.T.T. principles of resistance exercise including progression. Written material given at graduation.    Education: Exercise & Equipment Safety: - Individual verbal instruction and demonstration of equipment use and safety with use of the equipment. Flowsheet Row Pulmonary Rehab from 05/04/2021 in [facility] Cardiac and Pulmonary Rehab  Education need identified 05/04/21  Date 05/04/21  Educator [place]  Instruction Review Code 1- Verbalizes Understanding       Education: Exercise Physiology & General Exercise Guidelines: - Group verbal and written instruction with models to review the exercise physiology of the cardiovascular system and associated critical values. Provides general exercise guidelines with specific guidelines to those with heart or lung disease.    Education: Flexibility, Balance, Mind/Body Relaxation: - Group verbal and visual presentation with interactive activity on the components of exercise prescription. Introduces F.I.T.T principle from ACSM for exercise prescriptions. Reviews F.I.T.T. principles of flexibility and balance exercise training including progression. Also discusses the mind body connection.  Reviews various relaxation techniques to help reduce and manage stress (i.e. Deep breathing, progressive  muscle relaxation, and visualization). Balance handout provided to take home. Written material given at graduation.   Activity Barriers & Risk Stratification:  Activity Barriers & Cardiac Risk Stratification - 05/04/21 1026       Activity Barriers & Cardiac Risk Stratification   Activity Barriers Joint Problems;Deconditioning;Muscular Weakness;Shortness of Breath             6 Minute Walk:  6 Minute Walk     Row Name 05/04/21 1019         6 Minute Walk   Phase Initial     Distance 765 feet     Walk Time 5.75 minutes  Stopped at 5:30 min due to O2 sat     # of Rest Breaks 0     MPH 1.51     METS 4.27     RPE 10     Perceived Dyspnea  3  VO2 Peak 14.97     Symptoms Yes (comment)     Comments SOB, chest tightness     Resting HR 107 bpm     Resting BP 118/76     Resting Oxygen Saturation  99 %     Exercise Oxygen Saturation  during 6 min walk 80 %     Max Ex. HR 146 bpm     Max Ex. BP 126/80     2 Minute Post BP 120/80       Interval HR   1 Minute HR 122     2 Minute HR 132     3 Minute HR 136     4 Minute HR 143     5 Minute HR 145     6 Minute HR 146     2 Minute Post HR 126     Interval Heart Rate? Yes       Interval Oxygen   Interval Oxygen? Yes     Baseline Oxygen Saturation % 99 %     1 Minute Oxygen Saturation % 94 %     1 Minute Liters of Oxygen 6 L     2 Minute Oxygen Saturation % 92 %     2 Minute Liters of Oxygen 6 L     3 Minute Oxygen Saturation % 88 %     3 Minute Liters of Oxygen 6 L     4 Minute Oxygen Saturation % 87 %     4 Minute Liters of Oxygen 6 L     5 Minute Oxygen Saturation % 83 %     5 Minute Liters of Oxygen 6 L     6 Minute Oxygen Saturation % 80 %     6 Minute Liters of Oxygen 6 L     2 Minute Post Oxygen Saturation % 96 %     2 Minute Post Liters of Oxygen 6 L             Oxygen Initial Assessment:  Oxygen Initial Assessment - 05/04/21 1000       Home Oxygen   Home Oxygen Device Home Concentrator;E-Tanks     Sleep Oxygen Prescription None   she is being evaluated to see if she needs O2 at night per patient   Home Exercise Oxygen Prescription Continuous    Liters per minute 6    Home Resting Oxygen Prescription Continuous   as needed per patient   Liters per minute 6    Compliance with Home Oxygen Use No      Initial 6 min Walk   Oxygen Used Continuous    Liters per minute 6      Program Oxygen Prescription   Program Oxygen Prescription Continuous    Liters per minute 6      Intervention   Short Term Goals To learn and understand importance of monitoring SPO2 with pulse oximeter and demonstrate accurate use of the pulse oximeter.;To learn and understand importance of maintaining oxygen saturations>88%;To learn and demonstrate proper pursed lip breathing techniques or other breathing techniques. ;To learn and demonstrate proper use of respiratory medications;To learn and exhibit compliance with exercise, home and travel O2 prescription    Long  Term Goals Verbalizes importance of monitoring SPO2 with pulse oximeter and return demonstration;Maintenance of O2 saturations>88%;Exhibits proper breathing techniques, such as pursed lip breathing or other method taught during program session;Compliance with respiratory medication;Demonstrates proper use of MDI's;Exhibits compliance with exercise, home  and travel O2  prescription             Oxygen Re-Evaluation:  Oxygen Re-Evaluation     Row Name 05/09/21 0751             Program Oxygen Prescription   Program Oxygen Prescription Continuous       Liters per minute 6         Home Oxygen   Home Oxygen Device Home Concentrator;E-Tanks       Sleep Oxygen Prescription None       Home Exercise Oxygen Prescription Continuous       Liters per minute 6       Home Resting Oxygen Prescription Continuous       Compliance with Home Oxygen Use No         Goals/Expected Outcomes   Short Term Goals To learn and understand importance of monitoring  SPO2 with pulse oximeter and demonstrate accurate use of the pulse oximeter.;To learn and understand importance of maintaining oxygen saturations>88%;To learn and demonstrate proper pursed lip breathing techniques or other breathing techniques. ;To learn and exhibit compliance with exercise, home and travel O2 prescription       Long  Term Goals Verbalizes importance of monitoring SPO2 with pulse oximeter and return demonstration;Maintenance of O2 saturations>88%;Exhibits proper breathing techniques, such as pursed lip breathing or other method taught during program session;Compliance with respiratory medication;Exhibits compliance with exercise, home  and travel O2 prescription       Comments Reviewed PLB technique with pt.  Talked about how it works and it's importance in maintaining their exercise saturations.       Goals/Expected Outcomes Short: Become more profiecient at using PLB.   Long: Become independent at using PLB.                Oxygen Discharge (Final Oxygen Re-Evaluation):  Oxygen Re-Evaluation - 05/09/21 0751       Program Oxygen Prescription   Program Oxygen Prescription Continuous    Liters per minute 6      Home Oxygen   Home Oxygen Device Home Concentrator;E-Tanks    Sleep Oxygen Prescription None    Home Exercise Oxygen Prescription Continuous    Liters per minute 6    Home Resting Oxygen Prescription Continuous    Compliance with Home Oxygen Use No      Goals/Expected Outcomes   Short Term Goals To learn and understand importance of monitoring SPO2 with pulse oximeter and demonstrate accurate use of the pulse oximeter.;To learn and understand importance of maintaining oxygen saturations>88%;To learn and demonstrate proper pursed lip breathing techniques or other breathing techniques. ;To learn and exhibit compliance with exercise, home and travel O2 prescription    Long  Term Goals Verbalizes importance of monitoring SPO2 with pulse oximeter and return  demonstration;Maintenance of O2 saturations>88%;Exhibits proper breathing techniques, such as pursed lip breathing or other method taught during program session;Compliance with respiratory medication;Exhibits compliance with exercise, home  and travel O2 prescription    Comments Reviewed PLB technique with pt.  Talked about how it works and it's importance in maintaining their exercise saturations.    Goals/Expected Outcomes Short: Become more profiecient at using PLB.   Long: Become independent at using PLB.             Initial Exercise Prescription:  Initial Exercise Prescription - 05/04/21 1000       Date of Initial Exercise RX and Referring Provider   Date 05/04/21    Referring Provider Vonita Moss MD  Oxygen   Oxygen Continuous    Liters 6    Maintain Oxygen Saturation 88% or higher      Treadmill   MPH 1.3    Grade 0    Minutes 15    METs 2      Recumbant Bike   Level 1    RPM 60    Minutes 15    METs 4      NuStep   Level 1    SPM 80    Minutes 15    METs 4      REL-XR   Level 1    Speed 50    Minutes 15    METs 4      Prescription Details   Frequency (times per week) 3    Duration Progress to 30 minutes of continuous aerobic without signs/symptoms of physical distress      Intensity   THRR 40-80% of Max Heartrate 143-180    Ratings of Perceived Exertion 11-13    Perceived Dyspnea 0-4      Progression   Progression Continue to progress workloads to maintain intensity without signs/symptoms of physical distress.      Resistance Training   Training Prescription Yes    Weight 3 lb    Reps 10-15             Perform Capillary Blood Glucose checks as needed.  Exercise Prescription Changes:   Exercise Prescription Changes     Row Name 05/04/21 1000 05/16/21 1400           Response to Exercise   Blood Pressure (Admit) 118/76 124/76      Blood Pressure (Exercise) 126/80 122/60      Blood Pressure (Exit) 120/80 122/62      Heart  Rate (Admit) 107 bpm 104 bpm      Heart Rate (Exercise) 146 bpm 127 bpm      Heart Rate (Exit) 110 bpm 104 bpm      Oxygen Saturation (Admit) 99 % 98 %      Oxygen Saturation (Exercise) 80 % 92 %      Oxygen Saturation (Exit) 99 % 97 %      Rating of Perceived Exertion (Exercise) 10 12      Perceived Dyspnea (Exercise) 3 2      Symptoms SOB, chest tightness SOB      Comments walk test results second full day of exercise      Duration -- Progress to 30 minutes of  aerobic without signs/symptoms of physical distress      Intensity -- THRR unchanged        Progression   Progression -- Continue to progress workloads to maintain intensity without signs/symptoms of physical distress.      Average METs -- 1.93        Resistance Training   Training Prescription -- Yes      Weight -- 3 lb      Reps -- 10-15        Interval Training   Interval Training -- No        Oxygen   Oxygen -- Continuous      Liters -- 4  6L on treadmill        Treadmill   MPH -- 1.3      Grade -- 0      Minutes -- 15      METs -- 1.99        Recumbant Bike  Level -- 1.5      Minutes -- 15        NuStep   Level -- 1      Minutes -- 15      METs -- 2        REL-XR   Level -- 1      Minutes -- 15      METs -- 1.8        Oxygen   Maintain Oxygen Saturation -- 88% or higher               Exercise Comments:   Exercise Comments     Row Name 05/09/21 0750           Exercise Comments First full day of exercise!  Patient was oriented to gym and equipment including functions, settings, policies, and procedures.  Patient's individual exercise prescription and treatment plan were reviewed.  All starting workloads were established based on the results of the 6 minute walk test done at initial orientation visit.  The plan for exercise progression was also introduced and progression will be customized based on patient's performance and goals.                Exercise Goals and Review:    Exercise Goals     Row Name 05/04/21 1041             Exercise Goals   Increase Physical Activity Yes       Intervention Provide advice, education, support and counseling about physical activity/exercise needs.;Develop an individualized exercise prescription for aerobic and resistive training based on initial evaluation findings, risk stratification, comorbidities and participant's personal goals.       Expected Outcomes Short Term: Attend rehab on a regular basis to increase amount of physical activity.;Long Term: Exercising regularly at least 3-5 days a week.;Long Term: Add in home exercise to make exercise part of routine and to increase amount of physical activity.       Increase Strength and Stamina Yes       Intervention Provide advice, education, support and counseling about physical activity/exercise needs.;Develop an individualized exercise prescription for aerobic and resistive training based on initial evaluation findings, risk stratification, comorbidities and participant's personal goals.       Expected Outcomes Short Term: Increase workloads from initial exercise prescription for resistance, speed, and METs.;Short Term: Perform resistance training exercises routinely during rehab and add in resistance training at home;Long Term: Improve cardiorespiratory fitness, muscular endurance and strength as measured by increased METs and functional capacity (6MWT)       Able to understand and use rate of perceived exertion (RPE) scale Yes       Intervention Provide education and explanation on how to use RPE scale       Expected Outcomes Short Term: Able to use RPE daily in rehab to express subjective intensity level;Long Term:  Able to use RPE to guide intensity level when exercising independently       Able to understand and use Dyspnea scale Yes       Intervention Provide education and explanation on how to use Dyspnea scale       Expected Outcomes Short Term: Able to use Dyspnea scale  daily in rehab to express subjective sense of shortness of breath during exertion;Long Term: Able to use Dyspnea scale to guide intensity level when exercising independently       Knowledge and understanding of Target Heart Rate Range (THRR) Yes  Intervention Provide education and explanation of THRR including how the numbers were predicted and where they are located for reference       Expected Outcomes Short Term: Able to state/look up THRR;Long Term: Able to use THRR to govern intensity when exercising independently;Short Term: Able to use daily as guideline for intensity in rehab       Able to check pulse independently Yes       Intervention Provide education and demonstration on how to check pulse in carotid and radial arteries.;Review the importance of being able to check your own pulse for safety during independent exercise       Expected Outcomes Short Term: Able to explain why pulse checking is important during independent exercise;Long Term: Able to check pulse independently and accurately       Understanding of Exercise Prescription Yes       Intervention Provide education, explanation, and written materials on patient's individual exercise prescription       Expected Outcomes Short Term: Able to explain program exercise prescription;Long Term: Able to explain home exercise prescription to exercise independently                Exercise Goals Re-Evaluation :  Exercise Goals Re-Evaluation     Row Name 05/09/21 0751 05/16/21 1414           Exercise Goal Re-Evaluation   Exercise Goals Review Increase Physical Activity;Able to understand and use rate of perceived exertion (RPE) scale;Knowledge and understanding of Target Heart Rate Range (THRR);Understanding of Exercise Prescription;Increase Strength and Stamina;Able to understand and use Dyspnea scale;Able to check pulse independently Increase Physical Activity;Increase Strength and Stamina;Understanding of Exercise Prescription       Comments Reviewed RPE and dyspnea scales, THR and program prescription with pt today.  Pt voiced understanding and was given a copy of goals to take home. Messiah is off to a good start in rehab.  She has completed her first two full days of exercise.  We will continue to monitor her progress.      Expected Outcomes Short: Use RPE daily to regulate intensity. Long: Follow program prescription in THR. Short: attend rehab regularly Long: Conitnue to follow program prescription.               Discharge Exercise Prescription (Final Exercise Prescription Changes):  Exercise Prescription Changes - 05/16/21 1400       Response to Exercise   Blood Pressure (Admit) 124/76    Blood Pressure (Exercise) 122/60    Blood Pressure (Exit) 122/62    Heart Rate (Admit) 104 bpm    Heart Rate (Exercise) 127 bpm    Heart Rate (Exit) 104 bpm    Oxygen Saturation (Admit) 98 %    Oxygen Saturation (Exercise) 92 %    Oxygen Saturation (Exit) 97 %    Rating of Perceived Exertion (Exercise) 12    Perceived Dyspnea (Exercise) 2    Symptoms SOB    Comments second full day of exercise    Duration Progress to 30 minutes of  aerobic without signs/symptoms of physical distress    Intensity THRR unchanged      Progression   Progression Continue to progress workloads to maintain intensity without signs/symptoms of physical distress.    Average METs 1.93      Resistance Training   Training Prescription Yes    Weight 3 lb    Reps 10-15      Interval Training   Interval Training No  Oxygen   Oxygen Continuous    Liters 4   6L on treadmill     Treadmill   MPH 1.3    Grade 0    Minutes 15    METs 1.99      Recumbant Bike   Level 1.5    Minutes 15      NuStep   Level 1    Minutes 15    METs 2      REL-XR   Level 1    Minutes 15    METs 1.8      Oxygen   Maintain Oxygen Saturation 88% or higher             Nutrition:  Target Goals: Understanding of nutrition guidelines,  daily intake of sodium <1565m, cholesterol <2024m calories 30% from fat and 7% or less from saturated fats, daily to have 5 or more servings of fruits and vegetables.  Education: All About Nutrition: -Group instruction provided by verbal, written material, interactive activities, discussions, models, and posters to present general guidelines for heart healthy nutrition including fat, fiber, MyPlate, the role of sodium in heart healthy nutrition, utilization of the nutrition label, and utilization of this knowledge for meal planning. Follow up email sent as well. Written material given at graduation. [facility]  Education need identified 05/04/21       Biometrics:  Pre Biometrics - 05/04/21 1026       Pre Biometrics   Height 5' 5.75" (1.67 m)    Weight 248 lb 9.6 oz (112.8 kg)    BMI (Calculated) 40.43    Single Leg Stand 3.01 seconds              Nutrition Therapy Plan and Nutrition Goals:  Nutrition Therapy & Goals - 05/04/21 0954       Personal Nutrition Goals   Comments Rosamaria stated she is looking forward to getting nutrition help. She has lost her appetite recently and is dropping weight.      Intervention Plan   Intervention Prescribe, educate and counsel regarding individualized specific dietary modifications aiming towards targeted core components such as weight, hypertension, lipid management, diabetes, heart failure and other comorbidities.    Expected Outcomes Short Term Goal: Understand basic principles of dietary content, such as calories, fat, sodium, cholesterol and nutrients.;Long Term Goal: Adherence to prescribed nutrition plan.             Nutrition Assessments:  MEDIFICTS Score Key: ?70 Need to make dietary changes  40-70 Heart Healthy Diet ? 40 Therapeutic Level Cholesterol Diet  [facility]  Picture  Your Plate Total Score on Admission 54      Picture Your Plate Scores: <4<46nhealthy dietary pattern with much room for improvement. 41-50 Dietary pattern unlikely to meet recommendations for good health and room for improvement. 51-60 More healthful dietary pattern, with some room for improvement.  >60 Healthy dietary pattern, although there may be some specific behaviors that could be improved.   Nutrition Goals Re-Evaluation:   Nutrition Goals Discharge (Final Nutrition Goals Re-Evaluation):   Psychosocial: Target Goals: Acknowledge presence or absence of significant depression and/or stress, maximize coping skills, provide positive support system. Participant is able to verbalize types and ability to use techniques and skills needed for reducing stress and depression.   Education: Stress, Anxiety, and Depression - Group verbal and visual presentation to define topics  covered.  Reviews how body is impacted by stress, anxiety, and depression.  Also discusses healthy ways to reduce stress and to treat/manage anxiety and depression.  Written material given at graduation.   Education: Sleep Hygiene -Provides group verbal and written instruction about how sleep can affect your health.  Define sleep hygiene, discuss sleep cycles and impact of sleep habits. Review good sleep hygiene tips.    Initial Review & Psychosocial Screening:  Initial Psych Review & Screening - 03/31/21 1333       Initial Review   Current issues with Current Stress Concerns    Source of Stress Concerns Chronic Illness      Family Dynamics   Good Support System? Yes   mom, friends     Barriers   Psychosocial barriers to participate in program There are no identifiable barriers or psychosocial needs.;The patient should benefit from training in stress management and relaxation.      Screening Interventions   Interventions Encouraged to exercise;To provide support and resources with identified psychosocial  needs;Provide feedback about the scores to participant    Expected Outcomes Short Term goal: Utilizing psychosocial counselor, staff and physician to assist with identification of specific Stressors or current issues interfering with healing process. Setting desired goal for each stressor or current issue identified.;Long Term Goal: Stressors or current issues are controlled or eliminated.;Short Term goal: Identification and review with participant of any Quality of Life or Depression concerns found by scoring the questionnaire.;Long Term goal: The participant improves quality of Life and PHQ9 Scores as seen by post scores and/or verbalization of changes             Quality of Life Scores:  Scores of 19 and below usually indicate a poorer quality of life in these areas.  A difference of  2-3 points is a clinically meaningful difference.  A difference of 2-3 points in the total score of the Quality of Life Index has been associated with significant improvement in overall quality of life, self-image, physical symptoms, and general health in studies assessing change in quality of life.  PHQ-9: Recent Review Flowsheet Data     Depression screen [facility] 2/9 05/04/2021 06/16/2020 08/17/2019 07/31/2019 09/12/2018   Decreased Interest 0 1 0 0 0   Down, Depressed, Hopeless 1 0 0 0 1   PHQ - 2 Score 1 1 0 0 1   Altered sleeping 1 - - - 1   Tired, decreased energy 2 - - - 1   Change in appetite 3 - - - 1   Feeling bad or failure about yourself  0 - - - 0   Trouble concentrating 0 - - - 0   Moving slowly or fidgety/restless 0 - - - 0   Suicidal thoughts 0 - - - 0   PHQ-9 Score 7 - - - 4   Difficult doing work/chores Somewhat difficult - - - -      Interpretation of Total Score  Total Score Depression Severity:  1-4 = Minimal depression, 5-9 = Mild depression, 10-14 = Moderate depression, 15-19 = Moderately severe depression, 20-27 = Severe depression   Psychosocial Evaluation and Intervention:   Psychosocial Evaluation - 03/31/21 1344       Psychosocial Evaluation & Interventions   Interventions Encouraged to exercise with the program and follow exercise prescription;Stress management education    Comments Kourtnie was diagnosed with Lupus 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 She has been placed on oxygen and wears it  when needed. Her breathing has gotten worse recently which is why she has been referred. Her mother is her main support system. Whenever she tries to be active, her breathing gets in the way. She is hoping this program will help with how she is feeling and prevent future issues    Expected Outcomes Short: attend pulmonary rehab for education and exercise. Long: develop and maintain positive self care habits.    Continue Psychosocial Services  Follow up required by staff             Psychosocial Re-Evaluation:   Psychosocial Discharge (Final Psychosocial Re-Evaluation):   Education: Education Goals: Education classes will be provided on a weekly basis, covering required topics. Participant will state understanding/return demonstration of topics presented.  Learning Barriers/Preferences:  Learning Barriers/Preferences - 03/31/21 1343       Learning Barriers/Preferences   Learning Barriers None    Learning Preferences None             General Pulmonary Education Topics:  Infection Prevention: - Provides verbal and written material to individual with discussion of infection control including proper hand washing and proper equipment cleaning during exercise session. Flowsheet Row Pulmonary Rehab from 05/04/2021 in [facility] Card[facility] Rehab  Education need identified 05/04/21  Date 05/04/21  Educator [place]  Instruction Review Code 1- Verbalizes Understanding       Falls Prevention: - Provides verbal and written material to individual with discussion of falls prevention and safety. Flowsheet Row Pulmonary Rehab from 05/04/2021 in [facility]  Cardiac and Pulmonary Rehab  Education need identified 05/04/21  Date 05/04/21  Educator [place]  Instruction Review Code 1- Verbalizes Understanding       Chronic Lung Disease Review: - Group verbal instruction with posters, models, PowerPoint presentations and videos,  to review new updates, new respiratory medications, new advancements in procedures and treatments. Providing information on websites and "800" numbers for continued self-education. Includes information about supplement oxygen, available portable oxygen systems, continuous and intermittent flow rates, oxygen safety, concentrators, and Medicare reimbursement for oxygen. Explanation of Pulmonary Drugs, including class, frequency, complications, importance of spacers, rinsing mouth after steroid MDI's, and proper cleaning methods for nebulizers. Review of basic lung anatomy and physiology related to function, structure, and complications of lung disease. Review of risk factors. Discussion about methods for diagnosing sleep apnea and types of masks and machines for OSA. Includes a review of the use of types of environmental controls: home humidity, furnaces, filters, dust mite/pet prevention, HEPA vacuums. Discussion about weather changes, air quality and the benefits of nasal washing. Instruction on Warning signs, infection symptoms, calling MD promptly, preventive modes, and value of vaccinations. Review of effective airway clearance, coughing and/or vibration techniques. Emphasizing that all should Create an Action Plan. Written material given at graduation. Flowsheet Row Pulmonary Rehab from 05/04/2021 in [facility] Cardiac and Pulmonary Rehab  Education need identified 05/04/21       AED/CPR: - Group verbal and written instruction with the use of models to demonstrate the basic use of the AED with the basic ABC's of resuscitation.    Anatomy and Cardiac Procedures: - Group verbal and visual presentation and models provide information  about basic cardiac anatomy and function. Reviews the testing methods done to diagnose heart disease and the outcomes of the test results. Describes the treatment choices: Medical Management, Angioplasty, or Coronary Bypass Surgery for treating various heart conditions including Myocardial Infarction, Angina, Valve Disease, and Cardiac Arrhythmias.  Written material given at graduation.   Medication  Safety: - Group verbal and visual instruction to review commonly prescribed medications for heart and lung disease. Reviews the medication, class of the drug, and side effects. Includes the steps to properly store meds and maintain the prescription regimen.  Written material given at graduation.   Other: -Provides group and verbal instruction on various topics (see comments)   Knowledge Questionnaire Score:    Core Components/Risk Factors/Patient Goals at Admission:  Personal Goals and Risk Factors at Admission - 05/04/21 1042       Core Components/Risk Factors/Patient Goals on Admission    Weight Management Yes;Weight Loss;Obesity    Intervention Weight Management: Provide education and appropriate resources to help participant work on and attain dietary goals.;Weight Management: Develop a combined nutrition and exercise program designed to reach desired caloric intake, while maintaining appropriate intake of nutrient and fiber, sodium and fats, and appropriate energy expenditure required for the weight goal.    Admit Weight 248 lb (112.5 kg)    Goal Weight: Short Term 242 lb (109.8 kg)    Goal Weight: Long Term 215 lb (97.5 kg)    Expected Outcomes Long Term: Adherence to nutrition and physical activity/exercise program aimed toward attainment of established weight goal;Short Term: Continue to assess and modify interventions until short term weight is achieved;Weight Loss: Understanding of general recommendations for a balanced deficit meal plan, which promotes 1-2 lb weight loss per week and  includes a negative energy balance of [phone] kcal/d;Understanding recommendations for meals to include 15-35% energy as protein, 25-35% energy from fat, 35-60% energy from carbohydrates, less than 266m of dietary cholesterol, 20-35 gm of total fiber daily;Understanding of distribution of calorie intake throughout the day with the consumption of 4-5 meals/snacks    Improve shortness of breath with ADL's Yes    Intervention Provide education, individualized exercise plan and daily activity instruction to help decrease symptoms of SOB with activities of daily living.    Expected Outcomes Short Term: Improve cardiorespiratory fitness to achieve a reduction of symptoms when performing ADLs;Long Term: Be able to perform more ADLs without symptoms or delay the onset of symptoms             Education:Diabetes - Individual verbal and written instruction to review signs/symptoms of diabetes, desired ranges of glucose level fasting, after meals and with exercise. Acknowledge that pre and post exercise glucose checks will be done for 3 sessions at entry of program.   Know Your Numbers and Heart Failure: - Group verbal and visual instruction to discuss disease risk factors for cardiac and pulmonary disease and treatment options.  Reviews associated critical values for Overweight/Obesity, Hypertension, Cholesterol, and Diabetes.  Discusses basics of heart failure: signs/symptoms and treatments.  Introduces Heart Failure Zone chart for action plan for heart failure.  Written material given at graduation.   Core Components/Risk Factors/Patient Goals Review:    Core Components/Risk Factors/Patient Goals at Discharge (Final Review):    ITP Comments:  ITP Comments     Row Name 03/31/21 1342 05/04/21 0956 05/04/21 1351 05/09/21 0750 05/10/21 0703   ITP Comments Initial telephone orientation completed. [facility] EP orientation scheduled for Tuesday 10/4 at 8am. Completed 6MWT and  gym orientation. Initial ITP created and sent for review to Dr. FOttie Glazier Medical Director. Spoke with patient regarding importance of wearing O2 at home at rest and during exertion. Patient should be checking O2 levels at home and adhering to staying above 88%. Patient was reminded to bring her O2 with  her to rehab. Patient understood. First full day of exercise!  Patient was oriented to gym and equipment including functions, settings, policies, and procedures.  Patient's individual exercise prescription and treatment plan were reviewed.  All starting workloads were established based on the results of the 6 minute walk test done at initial orientation visit.  The plan for exercise progression was also introduced and progression will be customized based on patient's performance and goals. 30 Day review completed. Medical Director ITP review done, changes made as directed, and signed approval by Medical Director.   New to program    Row Name 05/31/21 1502 06/07/21 0650 06/12/21 1046 06/15/21 1348     ITP Comments Pt has not attended since last review.  She has had transportation issues 30 Day review completed. Medical Director ITP review done, changes made as directed, and signed approval by Medical Director. Patient has not attended since last review as she received another referral for [facility]. Called patient to verify her decision as to what she decides to do next and left message. Per chart review, patient has been in contact with Cardiac Rehab at [facility] and wishes to attend there. Discharging from [facility] program.             Comments: Discharge ITP

## 2021-06-19 ENCOUNTER — Ambulatory Visit: Payer: Medicaid Other

## 2021-06-20 ENCOUNTER — Ambulatory Visit: Payer: Medicaid Other

## 2021-06-22 ENCOUNTER — Ambulatory Visit: Payer: Medicaid Other

## 2021-06-26 ENCOUNTER — Ambulatory Visit: Payer: Medicaid Other

## 2021-06-27 ENCOUNTER — Ambulatory Visit: Payer: Medicaid Other

## 2021-06-29 ENCOUNTER — Ambulatory Visit: Payer: Medicaid Other

## 2021-07-04 ENCOUNTER — Ambulatory Visit: Payer: Medicaid Other

## 2021-07-06 ENCOUNTER — Ambulatory Visit: Payer: Medicaid Other

## 2021-07-11 ENCOUNTER — Ambulatory Visit: Payer: Medicaid Other

## 2021-07-13 ENCOUNTER — Ambulatory Visit: Payer: Medicaid Other

## 2021-07-14 ENCOUNTER — Other Ambulatory Visit (HOSPITAL_COMMUNITY): Payer: Self-pay | Admitting: *Deleted

## 2021-07-14 ENCOUNTER — Telehealth (HOSPITAL_COMMUNITY): Payer: Self-pay

## 2021-07-14 NOTE — Telephone Encounter (Signed)
Pt insurance is active and benefits verified through [facility] Co-pay $4, DED 0/0 met, out of pocket 0/0 met, co-insurance 0%. no pre-authorization required, Jasmine/[employer] 07/14/2021_0 :16am, REF# [id number]

## 2021-07-17 ENCOUNTER — Ambulatory Visit: Payer: Medicaid Other

## 2021-07-18 ENCOUNTER — Ambulatory Visit: Payer: Medicaid Other

## 2021-07-20 ENCOUNTER — Ambulatory Visit: Payer: Medicaid Other

## 2021-07-24 ENCOUNTER — Ambulatory Visit: Payer: Medicaid Other

## 2021-07-25 ENCOUNTER — Ambulatory Visit: Payer: Medicaid Other

## 2021-07-27 ENCOUNTER — Ambulatory Visit: Payer: Medicaid Other

## 2021-07-31 ENCOUNTER — Ambulatory Visit: Payer: Medicaid Other

## 2021-08-01 ENCOUNTER — Telehealth (HOSPITAL_COMMUNITY): Payer: Self-pay

## 2021-08-01 ENCOUNTER — Ambulatory Visit: Payer: Medicaid Other

## 2021-08-01 NOTE — Telephone Encounter (Signed)
Called to confirm orientation appointment. Pt did not have a voicemail to leave a message.

## 2021-08-01 NOTE — Telephone Encounter (Signed)
Called Domitila's mother to give her instructions for Renlee. Lianne Cure our department number and directions to the department. Mentioned to Marcie Bal that Sandar wears a closed toed, closed back shoes. Was instructed to call us if COVID screening changed.

## 2021-08-03 ENCOUNTER — Ambulatory Visit: Payer: Medicaid Other

## 2021-08-04 ENCOUNTER — Telehealth (HOSPITAL_COMMUNITY): Payer: Self-pay

## 2021-08-04 ENCOUNTER — Ambulatory Visit (HOSPITAL_COMMUNITY): Payer: Medicaid Other

## 2021-08-04 DIAGNOSIS — N870 Mild cervical dysplasia: Secondary | ICD-10-CM | POA: Insufficient documentation

## 2021-08-04 NOTE — Telephone Encounter (Signed)
Called Rhonda Franco to see if she was coming to her orientation. She did not answer and she did not show. Her appointments have been canceled and her paperwork will be given back to the schedulers.

## 2021-08-04 NOTE — Telephone Encounter (Signed)
2nd no show to pulmonary rehab. No contact from pt, unable to reach. Contacted the referring office Dr. Everlene Balls office at [facility], to advise them of the 2nd no show from pt for pulmonary rehab. Dr. Delman Kitten nurse stated that they have referred the pt 6 times for pulmonary rehab without success of pt completing the program and she stated that she will notate pt chart. Closed referral

## 2021-08-07 ENCOUNTER — Ambulatory Visit: Payer: Medicaid Other

## 2021-08-08 ENCOUNTER — Ambulatory Visit (HOSPITAL_COMMUNITY): Payer: Medicaid Other

## 2021-08-08 ENCOUNTER — Ambulatory Visit: Payer: Medicaid Other

## 2021-08-10 ENCOUNTER — Ambulatory Visit: Payer: Medicaid Other

## 2021-08-10 ENCOUNTER — Ambulatory Visit (HOSPITAL_COMMUNITY): Payer: Medicaid Other

## 2021-08-14 ENCOUNTER — Ambulatory Visit: Payer: Medicaid Other

## 2021-08-15 ENCOUNTER — Ambulatory Visit (HOSPITAL_COMMUNITY): Payer: Medicaid Other

## 2021-08-15 ENCOUNTER — Ambulatory Visit: Payer: Medicaid Other

## 2021-08-17 ENCOUNTER — Ambulatory Visit (HOSPITAL_COMMUNITY): Payer: Medicaid Other

## 2021-08-17 ENCOUNTER — Ambulatory Visit: Payer: Medicaid Other

## 2021-08-21 ENCOUNTER — Ambulatory Visit: Payer: Medicaid Other

## 2021-08-22 ENCOUNTER — Ambulatory Visit: Payer: Medicaid Other

## 2021-08-22 ENCOUNTER — Ambulatory Visit (HOSPITAL_COMMUNITY): Payer: Medicaid Other

## 2021-08-24 ENCOUNTER — Ambulatory Visit (HOSPITAL_COMMUNITY): Payer: Medicaid Other

## 2021-08-24 ENCOUNTER — Ambulatory Visit: Payer: Medicaid Other

## 2021-08-28 ENCOUNTER — Ambulatory Visit: Payer: Medicaid Other

## 2021-08-29 ENCOUNTER — Ambulatory Visit: Payer: Medicaid Other

## 2021-08-29 ENCOUNTER — Ambulatory Visit (HOSPITAL_COMMUNITY): Payer: Medicaid Other

## 2021-08-31 ENCOUNTER — Ambulatory Visit: Payer: Medicaid Other

## 2021-08-31 ENCOUNTER — Ambulatory Visit (HOSPITAL_COMMUNITY): Payer: Medicaid Other

## 2021-09-04 ENCOUNTER — Ambulatory Visit: Payer: Medicaid Other

## 2021-09-05 ENCOUNTER — Ambulatory Visit (HOSPITAL_COMMUNITY): Payer: Medicaid Other

## 2021-09-05 ENCOUNTER — Ambulatory Visit: Payer: Medicaid Other

## 2021-09-07 ENCOUNTER — Ambulatory Visit (HOSPITAL_COMMUNITY): Payer: Medicaid Other

## 2021-09-07 ENCOUNTER — Ambulatory Visit: Payer: Medicaid Other

## 2021-09-11 ENCOUNTER — Ambulatory Visit: Payer: Medicaid Other

## 2021-09-12 ENCOUNTER — Ambulatory Visit (HOSPITAL_COMMUNITY): Payer: Medicaid Other

## 2021-09-12 ENCOUNTER — Ambulatory Visit: Payer: Medicaid Other

## 2021-09-14 ENCOUNTER — Ambulatory Visit: Payer: Medicaid Other

## 2021-09-14 ENCOUNTER — Ambulatory Visit (HOSPITAL_COMMUNITY): Payer: Medicaid Other

## 2021-09-18 ENCOUNTER — Ambulatory Visit: Payer: Medicaid Other

## 2021-09-19 ENCOUNTER — Ambulatory Visit (HOSPITAL_COMMUNITY): Payer: Medicaid Other

## 2021-09-19 ENCOUNTER — Ambulatory Visit: Payer: Medicaid Other

## 2021-09-21 ENCOUNTER — Ambulatory Visit (HOSPITAL_COMMUNITY): Payer: Medicaid Other

## 2021-09-21 ENCOUNTER — Ambulatory Visit: Payer: Medicaid Other

## 2021-09-25 ENCOUNTER — Ambulatory Visit: Payer: Medicaid Other

## 2021-09-26 ENCOUNTER — Ambulatory Visit (HOSPITAL_COMMUNITY): Payer: Medicaid Other

## 2021-09-26 ENCOUNTER — Ambulatory Visit: Payer: Medicaid Other

## 2021-09-28 ENCOUNTER — Ambulatory Visit: Payer: Medicaid Other

## 2021-09-28 ENCOUNTER — Ambulatory Visit (HOSPITAL_COMMUNITY): Payer: Medicaid Other

## 2021-10-02 ENCOUNTER — Ambulatory Visit: Payer: Medicaid Other

## 2021-10-03 ENCOUNTER — Ambulatory Visit (HOSPITAL_COMMUNITY): Payer: Medicaid Other

## 2021-10-05 ENCOUNTER — Ambulatory Visit (HOSPITAL_COMMUNITY): Payer: Medicaid Other

## 2021-10-05 ENCOUNTER — Ambulatory Visit: Payer: Medicaid Other

## 2021-10-09 ENCOUNTER — Ambulatory Visit: Payer: Medicaid Other

## 2021-10-11 ENCOUNTER — Encounter: Payer: Medicaid Other | Attending: Critical Care Medicine | Admitting: *Deleted

## 2021-10-11 ENCOUNTER — Encounter: Payer: Self-pay | Admitting: *Deleted

## 2021-10-11 DIAGNOSIS — J849 Interstitial pulmonary disease, unspecified: Secondary | ICD-10-CM

## 2021-10-11 NOTE — Progress Notes (Signed)
Initial telephone orientation completed. Diagnosis can be found in [facility] 11/26. EP orientation scheduled for Monday 4/10 at 8am. ?

## 2021-10-12 ENCOUNTER — Ambulatory Visit: Payer: Medicaid Other

## 2021-10-16 ENCOUNTER — Emergency Department (HOSPITAL_BASED_OUTPATIENT_CLINIC_OR_DEPARTMENT_OTHER)
Admission: EM | Admit: 2021-10-16 | Discharge: 2021-10-16 | Disposition: A | Discharge status: Home / Self Care | Payer: Medicaid Other | Attending: Emergency Medicine | Admitting: Emergency Medicine

## 2021-10-16 ENCOUNTER — Ambulatory Visit: Payer: Medicaid Other

## 2021-10-16 ENCOUNTER — Other Ambulatory Visit: Payer: Self-pay

## 2021-10-16 ENCOUNTER — Emergency Department (HOSPITAL_BASED_OUTPATIENT_CLINIC_OR_DEPARTMENT_OTHER): Payer: Medicaid Other

## 2021-10-16 ENCOUNTER — Encounter (HOSPITAL_BASED_OUTPATIENT_CLINIC_OR_DEPARTMENT_OTHER): Payer: Self-pay | Admitting: Emergency Medicine

## 2021-10-16 DIAGNOSIS — D649 Anemia, unspecified: Secondary | ICD-10-CM | POA: Diagnosis not present

## 2021-10-16 DIAGNOSIS — R053 Chronic cough: Secondary | ICD-10-CM | POA: Insufficient documentation

## 2021-10-16 DIAGNOSIS — Z7982 Long term (current) use of aspirin: Secondary | ICD-10-CM | POA: Insufficient documentation

## 2021-10-16 DIAGNOSIS — R600 Localized edema: Secondary | ICD-10-CM | POA: Insufficient documentation

## 2021-10-16 DIAGNOSIS — D72819 Decreased white blood cell count, unspecified: Secondary | ICD-10-CM | POA: Insufficient documentation

## 2021-10-16 DIAGNOSIS — R000 Tachycardia, unspecified: Secondary | ICD-10-CM | POA: Insufficient documentation

## 2021-10-16 DIAGNOSIS — R7989 Other specified abnormal findings of blood chemistry: Secondary | ICD-10-CM

## 2021-10-16 DIAGNOSIS — R609 Edema, unspecified: Secondary | ICD-10-CM

## 2021-10-16 LAB — CBC WITH DIFFERENTIAL/PLATELET
Abs Immature Granulocytes: 0.01 10*3/uL (ref 0.00–0.07)
Basophils Absolute: 0 10*3/uL (ref 0.0–0.1)
Basophils Relative: 2 %
Eosinophils Absolute: 0.2 10*3/uL (ref 0.0–0.5)
Eosinophils Relative: 8 %
HCT: 35.5 % — ABNORMAL LOW (ref 36.0–46.0)
Hemoglobin: 10.9 g/dL — ABNORMAL LOW (ref 12.0–15.0)
Immature Granulocytes: 0 %
Lymphocytes Relative: 12 %
Lymphs Abs: 0.3 10*3/uL — ABNORMAL LOW (ref 0.7–4.0)
MCH: 25.2 pg — ABNORMAL LOW (ref 26.0–34.0)
MCHC: 30.7 g/dL (ref 30.0–36.0)
MCV: 82 fL (ref 80.0–100.0)
Monocytes Absolute: 0.5 10*3/uL (ref 0.1–1.0)
Monocytes Relative: 17 %
Neutro Abs: 1.6 10*3/uL — ABNORMAL LOW (ref 1.7–7.7)
Neutrophils Relative %: 61 %
Platelets: 288 10*3/uL (ref 150–400)
RBC: 4.33 MIL/uL (ref 3.87–5.11)
RDW: 17.3 % — ABNORMAL HIGH (ref 11.5–15.5)
WBC: 2.7 10*3/uL — ABNORMAL LOW (ref 4.0–10.5)
nRBC: 0 % (ref 0.0–0.2)

## 2021-10-16 LAB — URINALYSIS, MICROSCOPIC (REFLEX)

## 2021-10-16 LAB — BRAIN NATRIURETIC PEPTIDE: B Natriuretic Peptide: 1203 pg/mL — ABNORMAL HIGH (ref 0.0–100.0)

## 2021-10-16 LAB — URINALYSIS, ROUTINE W REFLEX MICROSCOPIC
Bilirubin Urine: NEGATIVE
Glucose, UA: NEGATIVE mg/dL
Hgb urine dipstick: NEGATIVE
Ketones, ur: NEGATIVE mg/dL
Leukocytes,Ua: NEGATIVE
Nitrite: NEGATIVE
Protein, ur: 100 mg/dL — AB
Specific Gravity, Urine: >=1.03 (ref 1.005–1.030)
pH: 6 (ref 5.0–8.0)

## 2021-10-16 LAB — COMPREHENSIVE METABOLIC PANEL
ALT: 14 U/L (ref 0–44)
AST: 19 U/L (ref 15–41)
Albumin: 2.7 g/dL — ABNORMAL LOW (ref 3.5–5.0)
Alkaline Phosphatase: 70 U/L (ref 38–126)
Anion gap: 8 (ref 5–15)
BUN: 7 mg/dL (ref 6–20)
CO2: 26 mmol/L (ref 22–32)
Calcium: 8 mg/dL — ABNORMAL LOW (ref 8.9–10.3)
Chloride: 106 mmol/L (ref 98–111)
Creatinine, Ser: 0.54 mg/dL (ref 0.44–1.00)
GFR, Estimated: >60 mL/min (ref >60)
Glucose, Bld: 78 mg/dL (ref 70–99)
Potassium: 3.5 mmol/L (ref 3.5–5.1)
Sodium: 140 mmol/L (ref 135–145)
Total Bilirubin: 0.6 mg/dL (ref 0.3–1.2)
Total Protein: 6.9 g/dL (ref 6.5–8.1)

## 2021-10-16 LAB — PROTIME-INR
INR: 1.3 — ABNORMAL HIGH (ref 0.8–1.2)
Prothrombin Time: 16.5 seconds — ABNORMAL HIGH (ref 11.4–15.2)

## 2021-10-16 LAB — PREGNANCY, URINE: Preg Test, Ur: NEGATIVE

## 2021-10-16 MED ORDER — POTASSIUM CHLORIDE CRYS ER 20 MEQ PO TBCR
40.0000 meq | EXTENDED_RELEASE_TABLET | Freq: Once | ORAL | Status: AC
  Administered 2021-10-16: 40 meq via ORAL
  Filled 2021-10-16: qty 2

## 2021-10-16 MED ORDER — FUROSEMIDE 10 MG/ML IJ SOLN
20.0000 mg | Freq: Once | INTRAMUSCULAR | Status: AC
  Administered 2021-10-16: 20 mg via INTRAVENOUS
  Filled 2021-10-16: qty 2

## 2021-10-16 MED ORDER — FUROSEMIDE 20 MG PO TABS: 20.0000 mg | ORAL_TABLET | Freq: Every day | ORAL | 0 refills | Status: DC

## 2021-10-16 NOTE — ED Notes (Addendum)
Attempt at PIV placement unsuccessful x2, will try to have RN attempt with u/s.  Pt also reports recent urinary symptoms, requested ua ?

## 2021-10-16 NOTE — ED Provider Notes (Signed)
?[facility] ?Provider Note ? ? ?CSN: [id number] ?Arrival date & time: 10/16/21  1258 ? ?  ? ?History ? ?Chief Complaint  ?Patient presents with  ? Leg Swelling  ? ? ?Rhonda Franco is a 21 y.o. female. ? ?21 yo F with a chief complaints of bilateral lower extremity edema.  This been going on for a couple weeks now.  She tells me that she got a infusion of cyclophosphamide for her lupus and since then has had swelling to her legs.  Otherwise she denies any significant change.  She has a chronic cough she has chronic oxygen requirement she denies any abdominal pain nausea or vomiting. ? ? ? ?  ? ?Home Medications ?Prior to Admission medications   ?Medication Sig Start Date End Date Taking? Authorizing Provider  ?aspirin EC 81 MG tablet Take 81 mg by mouth daily.    [provider]  ?atovaquone (MEPRON) 750 MG/5ML suspension Take by mouth. 03/08/21   [provider]  ?Cholecalciferol 50 MCG (2000 UT) TABS Take by mouth. 07/06/21   [provider]  ?Drospirenone (SLYND) 4 MG TABS Take 1 tablet by mouth daily. 04/27/20   Lajean Manes, CNM  ?famotidine (PEPCID) 20 MG tablet Take 20 mg by mouth 2 (two) times daily. 05/18/21   [provider]  ?fluticasone (FLONASE) 50 MCG/ACT nasal spray Place into both nostrils. 01/10/21   [provider]  ?gabapentin (NEURONTIN) 300 MG capsule Take 300 mg by mouth daily. ?Patient not taking: Reported on 10/11/2021 03/25/21   [provider]  ?hydroxychloroquine (PLAQUENIL) 200 MG tablet Take 1 tablet (200 mg total) by mouth 2 (two) times daily. 01/18/18   Charlann Lange, PA-C  ?MITIGARE 0.6 MG CAPS Take 0.6 mg by mouth 2 (two) times daily. 09/04/18   [provider]  ?mycophenolate (CELLCEPT) 500 MG tablet Take 500 mg by mouth 2 (two) times daily. ?Patient not taking: Reported on 03/31/2021 05/18/19   [provider]  ?mycophenolate (MYFORTIC) 360 MG TBEC EC tablet Take by mouth. 11/23/20  03/08/22  [provider]  ?naproxen (NAPRELAN) 500 MG 24 hr tablet Take by mouth. 09/08/21 09/08/22  [provider]  ?OZEMPIC, 0.25 OR 0.5 MG/DOSE, 2 MG/1.5ML SOPN SMARTSIG:0.25 Milligram(s) SUB-Q Once a Week ?Patient not taking: Reported on 10/11/2021 02/24/21   [provider]  ?topiramate (TOPAMAX) 50 MG tablet Take by mouth. ?Patient not taking: Reported on 10/11/2021 12/19/20   [provider]  ?   ? ?Allergies    ?Ampicillin, Cephalexin, Other, Penicillins, and Bactrim [sulfamethoxazole-trimethoprim]   ? ?Review of Systems   ?Review of Systems ? ?Physical Exam ?Updated Vital Signs ?BP (!) 107/57 (BP Location: Right Arm)   Pulse (!) 102   Temp 98.2 ?F (36.8 ?C)   Resp 14   Ht _0  (1.651 m)   Wt 107 kg   SpO2 100%   BMI 39.27 kg/m?  ?Physical Exam ?Vitals and nursing note reviewed.  ?Constitutional:   ?   General: She is not in acute distress. ?   Appearance: She is well-developed. She is not diaphoretic.  ?HENT:  ?   Head: Normocephalic and atraumatic.  ?Eyes:  ?   Pupils: Pupils are equal, round, and reactive to light.  ?Cardiovascular:  ?   Rate and Rhythm: Regular rhythm. Tachycardia present.  ?   Heart sounds: No murmur heard. ?  No friction rub. No gallop.  ?Pulmonary:  ?   Effort: Pulmonary effort is normal.  ?  Breath sounds: Rales (bases) present. No wheezing.  ?Abdominal:  ?   General: There is no distension.  ?   Palpations: Abdomen is soft.  ?   Tenderness: There is no abdominal tenderness.  ?Musculoskeletal:     ?   General: No tenderness.  ?   Cervical back: Normal range of motion and neck supple.  ?   Right lower leg: Edema present.  ?   Left lower leg: Edema present.  ?   Comments: 2+ lower extremity edema up to the thighs.  ?Skin: ?   General: Skin is warm and dry.  ?Neurological:  ?   Mental Status: She is alert and oriented to person, place, and time.  ?Psychiatric:     ?   Behavior: Behavior normal.  ? ? ?ED Results / Procedures / Treatments   ?Labs ?(all  labs ordered are listed, but only abnormal results are displayed) ?Labs Reviewed  ?CBC WITH DIFFERENTIAL/PLATELET - Abnormal; Notable for the following components:  ?    Result Value  ? WBC 2.7 (*)   ? Hemoglobin 10.9 (*)   ? HCT 35.5 (*)   ? MCH 25.2 (*)   ? RDW 17.3 (*)   ? Neutro Abs 1.6 (*)   ? Lymphs Abs 0.3 (*)   ? All other components within normal limits  ?COMPREHENSIVE METABOLIC PANEL - Abnormal; Notable for the following components:  ? Calcium 8.0 (*)   ? Albumin 2.7 (*)   ? All other components within normal limits  ?PROTIME-INR - Abnormal; Notable for the following components:  ? Prothrombin Time 16.5 (*)   ? INR 1.3 (*)   ? All other components within normal limits  ?URINALYSIS, ROUTINE W REFLEX MICROSCOPIC - Abnormal; Notable for the following components:  ? Protein, ur 100 (*)   ? All other components within normal limits  ?URINALYSIS, MICROSCOPIC (REFLEX) - Abnormal; Notable for the following components:  ? Bacteria, UA RARE (*)   ? All other components within normal limits  ?BRAIN NATRIURETIC PEPTIDE  ? ? ?EKG ?None ? ?Radiology ?DG Chest Port 1 View ? ?Result Date: 10/16/2021 ?CLINICAL DATA:  Shortness of breath and lower extremity edema. History of lupus. EXAM: PORTABLE CHEST 1 VIEW COMPARISON:  01/17/2021 chest x-ray and CT of the chest on 04/23/2018 FINDINGS: The heart size is normal. There remains soft tissue prominence in the region of the main pulmonary artery trunk suggestive of pulmonary artery dilatation. 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 No pneumothorax or pleural fluid identified. IMPRESSION: 1. She prominence in the region of the main pulmonary artery trunk suggestive pulmonary artery dilatation and pulmonary hypertension. 2. 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 Electronically  Signed   By: Aletta Edouard M.D.   On: 10/16/2021 14:51   ? ?Procedures ?Procedures  ? ? ?Medications Ordered in ED ?Medications - No data to display ? ?ED Course/ Medical Decision Making/ A&P ?  ?                        ?Medical Decision Making ?Amount and/or Complexity of Data Reviewed ?Labs: ordered. ?Radiology: ordered. ? ? ?21 yo F with a chief complaints of bilateral leg swelling.  This been going on for couple weeks after getting infusion of cyclophosphamide.  She was noted to be hypoxic and significantly tachycardic upon  arrival.  Is chronically on 2 L of oxygen at all times that did not travel with her oxygen.  She also was noted to be tachycardic at baseline.  Her most recent rheumatology visit she had a documented heart rate of 145. ? ?We will obtain a laboratory evaluation to assess for acute renal failure acute liver failure or heart failure. ? ?Chest x-ray independently interpreted by me without obvious signs of fluid overload.  Chronic signs compared to prior without change. ? ?LFTs are unremarkable.  Patient's hemoglobin is dropped slightly compared to most recent check at the rheumatology office.  Has a leukopenia which has changed but is likely due to her recent chemotherapy.  Her INR is trivially elevated. Awaiting BNP.  ? ?Signed out to Dr. Billy Fischer, please see her note for further details care in ED. ? ?The patients results and plan were reviewed and discussed.   ?Any x-rays performed were independently reviewed by myself.  ? ?Differential diagnosis were considered with the presenting HPI. ? ?Medications - No data to display ? ?Vitals:  ? 10/16/21 1415 10/16/21 1445 10/16/21 1515 10/16/21 1524  ?BP: 111/76 101/65  (!) 107/57  ?Pulse: (!) 132 (!) 105 (!) 102 (!) 102  ?Resp: (!) 26 (!) _0 ?Temp:      ?SpO2: 100% 98% 100% 100%  ?Weight:      ?Height:      ? ? ?Final diagnoses:  ?Peripheral edema  ? ? ? ? ? ? ? ? ? ?Final Clinical Impression(s) / ED Diagnoses ?Final diagnoses:  ?Peripheral  edema  ? ? ?Rx / DC Orders ?ED Discharge Orders   ? ? None  ? ?  ? ? ?  ?Deno Etienne, DO ?10/16/21 1528 ? ?

## 2021-10-16 NOTE — ED Provider Notes (Signed)
?  Physical Exam  ?BP 115/70   Pulse 95   Temp 98.2 ?F (36.8 ?C)   Resp 16   Ht _0  (1.651 m)   Wt 107 kg   SpO2 99%   BMI 39.27 kg/m?  ? ?Physical Exam ? ?Procedures  ?Procedures ? ?ED Course / MDM  ?  ?Medical Decision Making ?Amount and/or Complexity of Data Reviewed ?Labs: ordered. ?Radiology: ordered. ? ?Risk ?Prescription drug management. ? ? ?Received care of patient from Dr. Tyrone Nine.  Please see his note for prior history, physical and exam.  Briefly this is a 21 year old female who presented with bilateral lower extremity edema, and has a history of SLE, interstitial lung disease, receiving cyclophosphamide infusion.  She reports she has chronic tachycardia, and is to be on 2 L of nasal cannula which she was not on when she arrived initially to the emergency department. ? ? ?Prior ECHO from [facility]  Dr. Shirlee More 1/19 normal.   ? ?Labs completed and evaluated by me and Dr. Tyrone Nine included CBC with leukopenia, anemia with a hemoglobin of 10.9, decreased from 13.3 last year, does have an albumin of 2.7 which appeared normal last year as well, proteinuria noted on urinalysis. ? 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  Dr. Tyrone Nine had given Lasix 20 mg, and she has had significant amount of urine output since then. Discussed options for admission to expedite work up but given normal saturations on home O2, no change in chronic tachycardia which improved during stay, no significant changes on CXR feel outpt follow up with her Cardiologist and Pulmonologist reasonable. Given lasix rx for 4 days.  Encouraged to wear her home O2 at all times.  Patient discharged in stable condition with understanding of reasons to return.  ? ? ?  ?Gareth Morgan, MD ?10/17/21 2217 ? ?

## 2021-10-16 NOTE — ED Notes (Signed)
ED Provider at bedside. ?

## 2021-10-16 NOTE — ED Triage Notes (Signed)
Bilateral leg, ankle edema , no pain , denies further symptoms , reports infusion on 10/07/2021 for her Lupus she said . ?

## 2021-10-20 DIAGNOSIS — I50811 Acute right heart failure: Secondary | ICD-10-CM | POA: Insufficient documentation

## 2021-10-21 DIAGNOSIS — I272 Pulmonary hypertension, unspecified: Secondary | ICD-10-CM

## 2021-10-21 HISTORY — DX: Pulmonary hypertension, unspecified: I27.20

## 2021-11-13 ENCOUNTER — Encounter: Payer: Medicaid Other | Attending: Critical Care Medicine | Admitting: *Deleted

## 2021-11-13 VITALS — Ht 65.4 in | Wt 214.0 lb

## 2021-11-13 DIAGNOSIS — J849 Interstitial pulmonary disease, unspecified: Secondary | ICD-10-CM | POA: Insufficient documentation

## 2021-11-13 DIAGNOSIS — M3213 Lung involvement in systemic lupus erythematosus: Secondary | ICD-10-CM | POA: Insufficient documentation

## 2021-11-13 NOTE — Progress Notes (Signed)
Pulmonary Individual Treatment Plan ? ?Patient Details  ?Name: Dejai Schubach ?MRN: [id number] ?Date of Birth: June 30, 2001 ?Referring Provider:   ?Flowsheet Row Pulmonary Rehab from 11/13/2021 in [facility] Cardiac and Pulmonary Rehab  ?Referring Provider Vonita Moss MD  ? ?  ? ? ?Initial Encounter Date:  ?Flowsheet Row Pulmonary Rehab from 11/13/2021 in [facility] Cardiac and Pulmonary Rehab  ?Date 11/13/21  ? ?  ? ? ?Visit Diagnosis: ILD (interstitial lung disease) ([place]) ? ?Patient's Home Medications on Admission: ? ?Current Outpatient Medications:  ?  aspirin EC 81 MG tablet, Take 81 mg by mouth daily., Disp: , Rfl:  ?  atovaquone (MEPRON) 750 MG/5ML suspension, Take by mouth., Disp: , Rfl:  ?  Cholecalciferol 50 MCG (2000 UT) TABS, Take by mouth., Disp: , Rfl:  ?  Drospirenone (SLYND) 4 MG TABS, Take 1 tablet by mouth daily., Disp: 168 tablet, Rfl: 0 ?  famotidine (PEPCID) 20 MG tablet, Take 20 mg by mouth 2 (two) times daily., Disp: , Rfl:  ?  fluticasone (FLONASE) 50 MCG/ACT nasal spray, Place into both nostrils., Disp: , Rfl:  ?  furosemide (LASIX) 20 MG tablet, Take 1 tablet (20 mg total) by mouth daily for 4 days., Disp: 4 tablet, Rfl: 0 ?  gabapentin (NEURONTIN) 300 MG capsule, Take 300 mg by mouth daily. (Patient not taking: Reported on 10/11/2021), Disp: , Rfl:  ?  hydroxychloroquine (PLAQUENIL) 200 MG tablet, Take 1 tablet (200 mg total) by mouth 2 (two) times daily., Disp: 60 tablet, Rfl: 0 ?  MITIGARE 0.6 MG CAPS, Take 0.6 mg by mouth 2 (two) times daily., Disp: , Rfl:  ?  mycophenolate (CELLCEPT) 500 MG tablet, Take 500 mg by mouth 2 (two) times daily. (Patient not taking: Reported on 03/31/2021), Disp: , Rfl:  ?  mycophenolate (MYFORTIC) 360 MG TBEC EC tablet, Take by mouth., Disp: , Rfl:  ?  naproxen (NAPRELAN) 500 MG 24 hr tablet, Take by mouth., Disp: , Rfl:  ?  OZEMPIC, 0.25 OR 0.5 MG/DOSE, 2 MG/1.5ML SOPN, SMARTSIG:0.25 Milligram(s) SUB-Q Once a Week (Patient not taking: Reported on 10/11/2021), Disp: , Rfl:  ?   topiramate (TOPAMAX) 50 MG tablet, Take by mouth. (Patient not taking: Reported on 10/11/2021), Disp: , Rfl:  ? ?Past Medical History: ?Past Medical History:  ?Diagnosis Date  ? Anemia   ? Lupus ([place])   ? Prediabetes   ? ? ?Tobacco Use: ?Social History  ? ?Tobacco Use  ?Smoking Status Never  ?Smokeless Tobacco Never  ? ? ?Labs: ?Review Flowsheet   ? ?  ?  Latest Ref Rng & Units 05/05/2018 09/12/2018 08/17/2019 06/16/2020  ?Labs for ITP Cardiac and Pulmonary Rehab  ?Cholestrol 100 - 199 mg/dL 116     161    ?LDL (calc) 0 - 99 mg/dL 69     108    ?HDL-C >39 mg/dL 24     34    ?Trlycerides 0 - 149 mg/dL 115     100    ?Hemoglobin A1c 4.8 - 5.6 % 5.8   5.4   5.5     ?  ? ? Multiple values from one day are sorted in reverse-chronological order  ?  ?  ? ? ? ?Pulmonary Assessment Scores: ? Pulmonary Assessment Scores   ? ? [place] Name 11/13/21 1048  ?  ?  ?  ? ADL UCSD  ? ADL Phase Entry    ? SOB Score total 41    ? Rest 0    ? Walk 2    ?  Stairs 3    ? Bath 1    ? Dress 1    ? Shop 2    ?  ? CAT Score  ? CAT Score 22    ?  ? mMRC Score  ? mMRC Score 2    ? ?  ?  ? ?  ?  ?UCSD: ?Self-administered rating of dyspnea associated with activities of daily living (ADLs) ?6-point scale (0 = "not at all" to 5 = "maximal or unable to do because of breathlessness")  ?Scoring Scores range from 0 to 120.  Minimally important difference is 5 units ? ?CAT: ?CAT can identify the health impairment of COPD patients and is better correlated with disease progression.  ?CAT has a scoring range of zero to 40. The CAT score is classified into four groups of low (less than 10), medium (10 - 20), high (21-30) and very high (31-40) based on the impact level of disease on health status. A CAT score over 10 suggests significant symptoms.  A worsening CAT score could be explained by an exacerbation, poor medication adherence, poor inhaler technique, or progression of COPD or comorbid conditions.  ?CAT MCID is 2 points ? ?mMRC: ?mMRC (Modified Medical Research  Council) Dyspnea Scale is used to assess the degree of baseline functional disability in patients of respiratory disease due to dyspnea. ?No minimal important difference is established. A decrease in score of 1 point or greater is considered a positive change.  ? ?Pulmonary Function Assessment: ? ? ?Exercise Target Goals: ?Exercise Program Goal: ?Individual exercise prescription set using results from initial 6 min walk test and THRR while considering  patient?s activity barriers and safety.  ? ?Exercise Prescription Goal: ?Initial exercise prescription builds to 30-45 minutes a day of aerobic activity, 2-3 days per week.  Home exercise guidelines will be given to patient during program as part of exercise prescription that the participant will acknowledge. ? ?Education: Aerobic Exercise: ?- Group verbal and visual presentation on the components of exercise prescription. Introduces F.I.T.T principle from ACSM for exercise prescriptions.  Reviews F.I.T.T. principles of aerobic exercise including progression. Written material given at graduation. ? ? ?Education: Resistance Exercise: ?- Group verbal and visual presentation on the components of exercise prescription. Introduces F.I.T.T principle from ACSM for exercise prescriptions  Reviews F.I.T.T. principles of resistance exercise including progression. Written material given at graduation. ? ?  ?Education: Exercise & Equipment Safety: ?- Individual verbal instruction and demonstration of equipment use and safety with use of the equipment. ?Flowsheet Row Pulmonary Rehab from 11/13/2021 in [facility] Cardiac and Pulmonary Rehab  ?Date 11/13/21  ?Educator [facility]  ?Instruction Review Code 1- Verbalizes Understanding  ? ?  ? ? ?Education: Exercise Physiology & General Exercise Guidelines: ?- Group verbal and written instruction with models to review the exercise physiology of the cardiovascular system and associated critical values. Provides general exercise guidelines with specific  guidelines to those with heart or lung disease.  ? ? ?Education: Flexibility, Balance, Mind/Body Relaxation: ?- Group verbal and visual presentation with interactive activity on the components of exercise prescription. Introduces F.I.T.T principle from ACSM for exercise prescriptions. Reviews F.I.T.T. principles of flexibility and balance exercise training including progression. Also discusses the mind body connection.  Reviews various relaxation techniques to help reduce and manage stress (i.e. Deep breathing, progressive muscle relaxation, and visualization). Balance handout provided to take home. Written material given at graduation. ? ? ?Activity Barriers & Risk Stratification: ? Activity Barriers & Cardiac Risk Stratification - 11/13/21 1029   ? ?  ?  Activity Barriers & Cardiac Risk Stratification  ? Activity Barriers Deconditioning;Muscular Weakness;Shortness of Breath;Balance Concerns   ? ?  ?  ? ?  ? ? ?6 Minute Walk: ? 6 Minute Walk   ? ? [place] Name 11/13/21 1026  ?  ?  ?  ? 6 Minute Walk  ? Phase Initial    ? Distance 354 feet    ? Walk Time 2.78 minutes  stopped due to desaturation    ? # of Rest Breaks 0    ? MPH 1.44    ? METS 4    ? RPE 13    ? Perceived Dyspnea  2    ? VO2 Peak 14.02    ? Symptoms Yes (comment)    ? Comments SOB    ? Resting HR 111 bpm    ? Resting BP 112/62    ? Resting Oxygen Saturation  95 %    ? Exercise Oxygen Saturation  during 6 min walk 76 %    ? Max Ex. HR 145 bpm    ? Max Ex. BP 146/74    ? 2 Minute Post BP 142/70    ?  ? Interval HR  ? 1 Minute HR 145    ? 2 Minute HR 144    ? 3 Minute HR 138  stopped at 2:47    ? 2 Minute Post HR 136    ? Interval Heart Rate? Yes    ?  ? Interval Oxygen  ? Interval Oxygen? Yes    ? Baseline Oxygen Saturation % 95 %    ? 1 Minute Oxygen Saturation % 91 %    ? 1 Minute Liters of Oxygen 6 L    ? 2 Minute Oxygen Saturation % 85 %    ? 2 Minute Liters of Oxygen 10 L    ? 3 Minute Oxygen Saturation % 76 %  stopped at 2:47    ? 3 Minute Liters of  Oxygen 10 L    ? 2 Minute Post Oxygen Saturation % 93 %    ? 2 Minute Post Liters of Oxygen 10 L    ? ?  ?  ? ?  ? ?Oxygen Initial Assessment: ? Oxygen Initial Assessment - 11/13/21 1036   ? ?  ? Home Ox

## 2021-11-13 NOTE — Progress Notes (Signed)
Pt desaturated quickly during test to 76%.  She started on 6L and turned up to 10L for exercise.   She was only able to walk for 2:47.  She was wearing a normal nasal cannula as her high flow cannula was long to to manage for walk test. I believe she may benefit from wearing a mask for oxygen use versus high flow cannula for exercise as she is still breathing through her mouth.   ? ?Please advise on high flow cannula versus mask with %. ? ? 11/13/21 1026  ?6 Minute Walk  ?Phase Initial  ?Distance 354 feet  ?Walk Time 2.78 minutes ?(stopped due to desaturation)  ?# of Rest Breaks 0  ?MPH 1.44  ?METS 4  ?RPE 13  ?Perceived Dyspnea  2  ?VO2 Peak 14.02  ?Symptoms Yes (comment)  ?Comments SOB  ?Resting HR 111 bpm  ?Resting BP 112/62  ?Resting Oxygen Saturation  95 %  ?Exercise Oxygen Saturation  during 6 min walk 76 %  ?Max Ex. HR 145 bpm  ?Max Ex. BP 146/74  ?2 Minute Post BP 142/70  ?Interval HR  ?Interval Heart Rate? Yes  ?1 Minute HR 145  ?2 Minute HR 144  ?3 Minute HR 138 ?(stopped at 2:47)  ?2 Minute Post HR 136  ?Interval Oxygen  ?Interval Oxygen? Yes  ?Baseline Oxygen Saturation % 95 %  ?1 Minute Oxygen Saturation % 91 %  ?1 Minute Liters of Oxygen 6 L  ?2 Minute Oxygen Saturation % 85 %  ?2 Minute Liters of Oxygen 10 L  ?3 Minute Oxygen Saturation % 76 % ?(stopped at 2:47)  ?3 Minute Liters of Oxygen 10 L  ?2 Minute Post Oxygen Saturation % 93 %  ?2 Minute Post Liters of Oxygen 10 L  ? ?Alberteen Sam, MA, RCEP, CCRP ?11/13/2021 10:45 AM ? ?

## 2021-11-13 NOTE — Patient Instructions (Signed)
Patient Instructions ? ?Patient Details  ?Name: Rhonda Franco ?MRN: [id number] ?Date of Birth: 12/15/2000 ?Referring Provider:  Vonita Moss, MD ? ?Below are your personal goals for exercise, nutrition, and risk factors. Our goal is to help you stay on track towards obtaining and maintaining these goals. We will be discussing your progress on these goals with you throughout the program. ? ?Initial Exercise Prescription: ? Initial Exercise Prescription - 11/13/21 1000   ? ?  ? Date of Initial Exercise RX and Referring Provider  ? Date 11/13/21   ? Referring Provider Vonita Moss MD   ?  ? Oxygen  ? Oxygen Continuous   ? Liters 10   ? Maintain Oxygen Saturation 88% or higher   ?  ? Treadmill  ? MPH 1.2   ? Grade 0.5   ? Minutes 15   ? METs 2   ?  ? Recumbant Bike  ? Level 2   ? RPM 50   ? Watts 15   ? Minutes 15   ? METs 2   ?  ? NuStep  ? Level 3   ? SPM 80   ? Minutes 15   ? METs 2   ?  ? T5 Nustep  ? Level 3   ? SPM 80   ? Minutes 15   ? METs 2   ?  ? Prescription Details  ? Frequency (times per week) 2   ? Duration Progress to 30 minutes of continuous aerobic without signs/symptoms of physical distress   ?  ? Intensity  ? THRR 40-80% of Max Heartrate 146-181   ? Ratings of Perceived Exertion 11-13   ? Perceived Dyspnea 0-4   ?  ? Progression  ? Progression Continue to progress workloads to maintain intensity without signs/symptoms of physical distress.   ?  ? Resistance Training  ? Training Prescription Yes   ? Weight 3 lb   ? Reps 10-15   ? ?  ?  ? ?  ? ? ?Exercise Goals: ?Frequency: Be able to perform aerobic exercise two to three times per week in program working toward 2-5 days per week of home exercise. ? ?Intensity: Work with a perceived exertion of 11 (fairly light) - 15 (hard) while following your exercise prescription.  We will make changes to your prescription with you as you progress through the program. ?  ?Duration: Be able to do 30 to 45 minutes of continuous aerobic exercise in addition to a 5  minute warm-up and a 5 minute cool-down routine. ?  ?Nutrition Goals: ?Your personal nutrition goals will be established when you do your nutrition analysis with the dietician. ? ?The following are general nutrition guidelines to follow: ?Cholesterol < 251m/day ?Sodium < 15010mday ?Fiber: Women under 50 yrs - 25 grams per day ? ?Personal Goals: ? Personal Goals and Risk Factors at Admission - 11/13/21 1035   ? ?  ? Core Components/Risk Factors/Patient Goals on Admission  ?  Weight Management Yes;Weight Loss;Obesity   ? Intervention Weight Management: Provide education and appropriate resources to help participant work on and attain dietary goals.;Weight Management: Develop a combined nutrition and exercise program designed to reach desired caloric intake, while maintaining appropriate intake of nutrient and fiber, sodium and fats, and appropriate energy expenditure required for the weight goal.;Weight Management/Obesity: Establish reasonable short term and long term weight goals.;Obesity: Provide education and appropriate resources to help participant work on and attain dietary goals.   ? Admit Weight 214 lb (97.1 kg)   ?  Goal Weight: Short Term 209 lb (94.8 kg)   ? Goal Weight: Long Term 200 lb (90.7 kg)   ? Expected Outcomes Long Term: Adherence to nutrition and physical activity/exercise program aimed toward attainment of established weight goal;Short Term: Continue to assess and modify interventions until short term weight is achieved;Weight Loss: Understanding of general recommendations for a balanced deficit meal plan, which promotes 1-2 lb weight loss per week and includes a negative energy balance of [phone] kcal/d;Understanding recommendations for meals to include 15-35% energy as protein, 25-35% energy from fat, 35-60% energy from carbohydrates, less than 269m of dietary cholesterol, 20-35 gm of total fiber daily;Understanding of distribution of calorie intake throughout the day with the consumption of  4-5 meals/snacks   ? Improve shortness of breath with ADL's Yes   ? Intervention Provide education, individualized exercise plan and daily activity instruction to help decrease symptoms of SOB with activities of daily living.   ? Expected Outcomes Short Term: Improve cardiorespiratory fitness to achieve a reduction of symptoms when performing ADLs;Long Term: Be able to perform more ADLs without symptoms or delay the onset of symptoms   ? Increase knowledge of respiratory medications and ability to use respiratory devices properly  Yes   ? Intervention Provide education and demonstration as needed of appropriate use of medications, inhalers, and oxygen therapy.   ? Expected Outcomes Short Term: Achieves understanding of medications use. Understands that oxygen is a medication prescribed by physician. Demonstrates appropriate use of inhaler and oxygen therapy.;Long Term: Maintain appropriate use of medications, inhalers, and oxygen therapy.   ? ?  ?  ? ?  ? ? ?Tobacco Use Initial Evaluation: ?Social History  ? ?Tobacco Use  ?Smoking Status Never  ?Smokeless Tobacco Never  ? ? ?Exercise Goals and Review: ? Exercise Goals   ? ? [place] 11/13/21 1033  ?  ?  ?  ?  ?  ? Exercise Goals  ? Increase Physical Activity Yes      ? Intervention Provide advice, education, support and counseling about physical activity/exercise needs.;Develop an individualized exercise prescription for aerobic and resistive training based on initial evaluation findings, risk stratification, comorbidities and participant's personal goals.      ? Expected Outcomes Short Term: Attend rehab on a regular basis to increase amount of physical activity.;Long Term: Exercising regularly at least 3-5 days a week.;Long Term: Add in home exercise to make exercise part of routine and to increase amount of physical activity.      ? Increase Strength and Stamina Yes      ? Intervention Provide advice, education, support and counseling about physical  activity/exercise needs.;Develop an individualized exercise prescription for aerobic and resistive training based on initial evaluation findings, risk stratification, comorbidities and participant's personal goals.      ? Expected Outcomes Short Term: Increase workloads from initial exercise prescription for resistance, speed, and METs.;Short Term: Perform resistance training exercises routinely during rehab and add in resistance training at home;Long Term: Improve cardiorespiratory fitness, muscular endurance and strength as measured by increased METs and functional capacity (6MWT)      ? Able to understand and use rate of perceived exertion (RPE) scale Yes      ? Intervention Provide education and explanation on how to use RPE scale      ? Expected Outcomes Short Term: Able to use RPE daily in rehab to express subjective intensity level;Long Term:  Able to use RPE to guide intensity level when exercising independently      ?  Able to understand and use Dyspnea scale Yes      ? Intervention Provide education and explanation on how to use Dyspnea scale      ? Expected Outcomes Short Term: Able to use Dyspnea scale daily in rehab to express subjective sense of shortness of breath during exertion;Long Term: Able to use Dyspnea scale to guide intensity level when exercising independently      ? Knowledge and understanding of Target Heart Rate Range (THRR) Yes      ? Intervention Provide education and explanation of THRR including how the numbers were predicted and where they are located for reference      ? Expected Outcomes Short Term: Able to state/look up THRR;Long Term: Able to use THRR to govern intensity when exercising independently;Short Term: Able to use daily as guideline for intensity in rehab      ? Able to check pulse independently Yes      ? Intervention Provide education and demonstration on how to check pulse in carotid and radial arteries.;Review the importance of being able to check your own pulse for  safety during independent exercise      ? Expected Outcomes Short Term: Able to explain why pulse checking is important during independent exercise;Long Term: Able to check pulse independently and accurately      ?

## 2021-11-16 ENCOUNTER — Encounter: Payer: Medicaid Other | Admitting: *Deleted

## 2021-11-16 DIAGNOSIS — J849 Interstitial pulmonary disease, unspecified: Secondary | ICD-10-CM | POA: Diagnosis not present

## 2021-11-16 NOTE — Progress Notes (Signed)
Daily Session Note ? ?Patient Details  ?Name: Rhonda Franco ?MRN: [id number] ?Date of Birth: Oct 16, 2000 ?Referring Provider:   ?Flowsheet Row Pulmonary Rehab from 11/13/2021 in [facility] Cardiac and Pulmonary Rehab  ?Referring Provider Vonita Moss MD  ? ?  ? ? ?Encounter Date: 11/16/2021 ? ?Check In: ? Session Check In - 11/16/21 0841   ? ?  ? Check-In  ? Supervising physician immediately available to respond to emergencies See telemetry face sheet for immediately available ER MD   ? Location [facility]-Cardiac & Pulmonary Rehab   ? Staff Present Heath Lark, RN, BSN, CCRP;Melissa [place]-Eddy, RDN, LDN;Joseph [place], [place]   ? Virtual Visit No   ? Medication changes reported     No   ? Fall or balance concerns reported    No   ? Warm-up and Cool-down Performed on first and last piece of equipment   ? Resistance Training Performed Yes   ? VAD Patient? No   ? PAD/SET Patient? No   ?  ? Pain Assessment  ? Currently in Pain? No/denies   ? ?  ?  ? ?  ? ? ? ? ? ?Social History  ? ?Tobacco Use  ?Smoking Status Never  ?Smokeless Tobacco Never  ? ? ?Goals Met:  ?Proper associated with RPD/PD & O2 Sat ?Exercise tolerated well ?Personal goals reviewed ?No report of concerns or symptoms today ? ?Goals Unmet:  ?Not Applicable ? ?Comments: First full day of exercise!  Patient was oriented to gym and equipment including functions, settings, policies, and procedures.  Patient's individual exercise prescription and treatment plan were reviewed.  All starting workloads were established based on the results of the 6 minute walk test done at initial orientation visit.  The plan for exercise progression was also introduced and progression will be customized based on patient's performance and goals. ? ? ? ?Dr. Emily Filbert is Medical Director for [place].  ?Dr. Ottie Glazier is Medical Director for [facility] Pulmonary Rehabilitation. ?

## 2021-11-22 ENCOUNTER — Encounter: Payer: Self-pay | Admitting: *Deleted

## 2021-11-22 DIAGNOSIS — J849 Interstitial pulmonary disease, unspecified: Secondary | ICD-10-CM

## 2021-11-22 NOTE — Progress Notes (Signed)
Pulmonary Individual Treatment Plan ? ?Patient Details  ?Name: Dorathea Faerber ?MRN: [id number] ?Date of Birth: 2000-09-25 ?Referring Provider:   ?Flowsheet Row Pulmonary Rehab from 11/13/2021 in [facility] Cardiac and Pulmonary Rehab  ?Referring Provider Vonita Moss MD  ? ?  ? ? ?Initial Encounter Date:  ?Flowsheet Row Pulmonary Rehab from 11/13/2021 in [facility] Cardiac and Pulmonary Rehab  ?Date 11/13/21  ? ?  ? ? ?Visit Diagnosis: ILD (interstitial lung disease) ([place]) ? ?Patient's Home Medications on Admission: ? ?Current Outpatient Medications:  ?  aspirin EC 81 MG tablet, Take 81 mg by mouth daily., Disp: , Rfl:  ?  atovaquone (MEPRON) 750 MG/5ML suspension, Take by mouth., Disp: , Rfl:  ?  Cholecalciferol 50 MCG (2000 UT) TABS, Take by mouth., Disp: , Rfl:  ?  Drospirenone (SLYND) 4 MG TABS, Take 1 tablet by mouth daily., Disp: 168 tablet, Rfl: 0 ?  famotidine (PEPCID) 20 MG tablet, Take 20 mg by mouth 2 (two) times daily., Disp: , Rfl:  ?  fluticasone (FLONASE) 50 MCG/ACT nasal spray, Place into both nostrils., Disp: , Rfl:  ?  furosemide (LASIX) 20 MG tablet, Take 1 tablet (20 mg total) by mouth daily for 4 days., Disp: 4 tablet, Rfl: 0 ?  gabapentin (NEURONTIN) 300 MG capsule, Take 300 mg by mouth daily. (Patient not taking: Reported on 10/11/2021), Disp: , Rfl:  ?  hydroxychloroquine (PLAQUENIL) 200 MG tablet, Take 1 tablet (200 mg total) by mouth 2 (two) times daily., Disp: 60 tablet, Rfl: 0 ?  MITIGARE 0.6 MG CAPS, Take 0.6 mg by mouth 2 (two) times daily., Disp: , Rfl:  ?  mycophenolate (CELLCEPT) 500 MG tablet, Take 500 mg by mouth 2 (two) times daily. (Patient not taking: Reported on 03/31/2021), Disp: , Rfl:  ?  mycophenolate (MYFORTIC) 360 MG TBEC EC tablet, Take by mouth., Disp: , Rfl:  ?  naproxen (NAPRELAN) 500 MG 24 hr tablet, Take by mouth., Disp: , Rfl:  ?  OZEMPIC, 0.25 OR 0.5 MG/DOSE, 2 MG/1.5ML SOPN, SMARTSIG:0.25 Milligram(s) SUB-Q Once a Week (Patient not taking: Reported on 10/11/2021), Disp: , Rfl:  ?   topiramate (TOPAMAX) 50 MG tablet, Take by mouth. (Patient not taking: Reported on 10/11/2021), Disp: , Rfl:  ? ?Past Medical History: ?Past Medical History:  ?Diagnosis Date  ? Anemia   ? Lupus ([place])   ? Prediabetes   ? ? ?Tobacco Use: ?Social History  ? ?Tobacco Use  ?Smoking Status Never  ?Smokeless Tobacco Never  ? ? ?Labs: ?Review Flowsheet   ? ?  ?  Latest Ref Rng & Units 05/05/2018 09/12/2018 08/17/2019 06/16/2020  ?Labs for ITP Cardiac and Pulmonary Rehab  ?Cholestrol 100 - 199 mg/dL 116     161    ?LDL (calc) 0 - 99 mg/dL 69     108    ?HDL-C >39 mg/dL 24     34    ?Trlycerides 0 - 149 mg/dL 115     100    ?Hemoglobin A1c 4.8 - 5.6 % 5.8   5.4   5.5     ?  ?  ?  ? ? ? ?Pulmonary Assessment Scores: ? Pulmonary Assessment Scores   ? ? [place] Name 11/13/21 1048  ?  ?  ?  ? ADL UCSD  ? ADL Phase Entry    ? SOB Score total 41    ? Rest 0    ? Walk 2    ? Stairs 3    ? Bath 1    ?  Dress 1    ? Shop 2    ?  ? CAT Score  ? CAT Score 22    ?  ? mMRC Score  ? mMRC Score 2    ? ?  ?  ? ?  ?  ?UCSD: ?Self-administered rating of dyspnea associated with activities of daily living (ADLs) ?6-point scale (0 = "not at all" to 5 = "maximal or unable to do because of breathlessness")  ?Scoring Scores range from 0 to 120.  Minimally important difference is 5 units ? ?CAT: ?CAT can identify the health impairment of COPD patients and is better correlated with disease progression.  ?CAT has a scoring range of zero to 40. The CAT score is classified into four groups of low (less than 10), medium (10 - 20), high (21-30) and very high (31-40) based on the impact level of disease on health status. A CAT score over 10 suggests significant symptoms.  A worsening CAT score could be explained by an exacerbation, poor medication adherence, poor inhaler technique, or progression of COPD or comorbid conditions.  ?CAT MCID is 2 points ? ?mMRC: ?mMRC (Modified Medical Research Council) Dyspnea Scale is used to assess the degree of baseline functional  disability in patients of respiratory disease due to dyspnea. ?No minimal important difference is established. A decrease in score of 1 point or greater is considered a positive change.  ? ?Pulmonary Function Assessment: ? ? ?Exercise Target Goals: ?Exercise Program Goal: ?Individual exercise prescription set using results from initial 6 min walk test and THRR while considering  patient?s activity barriers and safety.  ? ?Exercise Prescription Goal: ?Initial exercise prescription builds to 30-45 minutes a day of aerobic activity, 2-3 days per week.  Home exercise guidelines will be given to patient during program as part of exercise prescription that the participant will acknowledge. ? ?Education: Aerobic Exercise: ?- Group verbal and visual presentation on the components of exercise prescription. Introduces F.I.T.T principle from ACSM for exercise prescriptions.  Reviews F.I.T.T. principles of aerobic exercise including progression. Written material given at graduation. ? ? ?Education: Resistance Exercise: ?- Group verbal and visual presentation on the components of exercise prescription. Introduces F.I.T.T principle from ACSM for exercise prescriptions  Reviews F.I.T.T. principles of resistance exercise including progression. Written material given at graduation. ?Flowsheet Row Pulmonary Rehab from 11/16/2021 in [facility] Cardiac and Pulmonary Rehab  ?Date 11/16/21  ?Educator [facility]  ?Instruction Review Code 1- Verbalizes Understanding  ? ?  ? ?  ?Education: Exercise & Equipment Safety: ?- Individual verbal instruction and demonstration of equipment use and safety with use of the equipment. ?Flowsheet Row Pulmonary Rehab from 11/16/2021 in [facility] Cardiac and Pulmonary Rehab  ?Date 11/13/21  ?Educator [facility]  ?Instruction Review Code 1- Verbalizes Understanding  ? ?  ? ? ?Education: Exercise Physiology & General Exercise Guidelines: ?- Group verbal and written instruction with models to review the exercise physiology of the  cardiovascular system and associated critical values. Provides general exercise guidelines with specific guidelines to those with heart or lung disease.  ? ? ?Education: Flexibility, Balance, Mind/Body Relaxation: ?- Group verbal and visual presentation with interactive activity on the components of exercise prescription. Introduces F.I.T.T principle from ACSM for exercise prescriptions. Reviews F.I.T.T. principles of flexibility and balance exercise training including progression. Also discusses the mind body connection.  Reviews various relaxation techniques to help reduce and manage stress (i.e. Deep breathing, progressive muscle relaxation, and visualization). Balance handout provided to take home. Written material given at graduation. ? ? ?  Activity Barriers & Risk Stratification: ? Activity Barriers & Cardiac Risk Stratification - 11/13/21 1029   ? ?  ? Activity Barriers & Cardiac Risk Stratification  ? Activity Barriers Deconditioning;Muscular Weakness;Shortness of Breath;Balance Concerns   ? ?  ?  ? ?  ? ? ?6 Minute Walk: ? 6 Minute Walk   ? ? [place] Name 11/13/21 1026  ?  ?  ?  ? 6 Minute Walk  ? Phase Initial    ? Distance 354 feet    ? Walk Time 2.78 minutes  stopped due to desaturation    ? # of Rest Breaks 0    ? MPH 1.44    ? METS 4    ? RPE 13    ? Perceived Dyspnea  2    ? VO2 Peak 14.02    ? Symptoms Yes (comment)    ? Comments SOB    ? Resting HR 111 bpm    ? Resting BP 112/62    ? Resting Oxygen Saturation  95 %    ? Exercise Oxygen Saturation  during 6 min walk 76 %    ? Max Ex. HR 145 bpm    ? Max Ex. BP 146/74    ? 2 Minute Post BP 142/70    ?  ? Interval HR  ? 1 Minute HR 145    ? 2 Minute HR 144    ? 3 Minute HR 138  stopped at 2:47    ? 2 Minute Post HR 136    ? Interval Heart Rate? Yes    ?  ? Interval Oxygen  ? Interval Oxygen? Yes    ? Baseline Oxygen Saturation % 95 %    ? 1 Minute Oxygen Saturation % 91 %    ? 1 Minute Liters of Oxygen 6 L    ? 2 Minute Oxygen Saturation % 85 %    ? 2  Minute Liters of Oxygen 10 L    ? 3 Minute Oxygen Saturation % 76 %  stopped at 2:47    ? 3 Minute Liters of Oxygen 10 L    ? 2 Minute Post Oxygen Saturation % 93 %    ? 2 Minute Post Liters of Oxygen 10 L    ? ?  ?

## 2021-11-23 DIAGNOSIS — J849 Interstitial pulmonary disease, unspecified: Secondary | ICD-10-CM

## 2021-11-23 NOTE — Progress Notes (Signed)
Daily Session Note  Patient Details  Name: Rhonda Franco MRN: [id number] Date of Birth: 03/03/2001 Referring Provider:   Flowsheet Row Pulmonary Rehab from 11/13/2021 in [facility] Cardiac and Pulmonary Rehab  Referring Provider Vonita Moss MD       Encounter Date: 11/23/2021  Check In:  Session Check In - 11/23/21 0711       Check-In   Supervising physician immediately available to respond to emergencies See telemetry face sheet for immediately available ER MD    Location [facility]-Cardiac & Pulmonary Rehab    Staff Present Birdie Sons, MPA, RN;Jessica Luan Pulling, MA, RCEP, CCRP, CCET    Virtual Visit No    Medication changes reported     No    Fall or balance concerns reported    No    Warm-up and Cool-down Performed on first and last piece of equipment    Resistance Training Performed Yes    VAD Patient? No    PAD/SET Patient? No      Pain Assessment   Currently in Pain? No/denies                Social History   Tobacco Use  Smoking Status Never  Smokeless Tobacco Never    Goals Met:  Independence with exercise equipment Exercise tolerated well No report of concerns or symptoms today Strength training completed today  Goals Unmet:  Not Applicable  Comments: Pt able to follow exercise prescription today without complaint.  Will continue to monitor for progression.    Dr. Emily Filbert is Medical Director for [place].  Dr. Ottie Glazier is Medical Director for [facility] Pulmonary Rehabilitation.

## 2021-12-12 ENCOUNTER — Encounter: Payer: Self-pay | Admitting: *Deleted

## 2021-12-12 ENCOUNTER — Telehealth: Payer: Self-pay | Admitting: *Deleted

## 2021-12-12 DIAGNOSIS — J849 Interstitial pulmonary disease, unspecified: Secondary | ICD-10-CM

## 2021-12-12 NOTE — Telephone Encounter (Signed)
Rhonda Franco left message this morning, returning call.  She has been calling out sick for last two weeks.  She is finally starting to feel better but waiting to hear from pulmonology and rheumatology about why she keeps getting sick.  She will let us know when she can return.

## 2021-12-19 ENCOUNTER — Encounter: Payer: Self-pay | Admitting: *Deleted

## 2021-12-19 DIAGNOSIS — J849 Interstitial pulmonary disease, unspecified: Secondary | ICD-10-CM

## 2021-12-19 NOTE — Progress Notes (Signed)
Pulmonary Individual Treatment Plan  Patient Details  Name: Rhonda Franco MRN: [id number] Date of Birth: 08-Apr-2001 Referring Provider:   Flowsheet Row Pulmonary Rehab from 11/13/2021 in [facility] Cardiac and Pulmonary Rehab  Referring Provider Rhonda Moss MD       Initial Encounter Date:  Flowsheet Row Pulmonary Rehab from 11/13/2021 in [facility] Cardiac and Pulmonary Rehab  Date 11/13/21       Visit Diagnosis: ILD (interstitial lung disease) ([place])  Patient's Home Medications on Admission:  Current Outpatient Medications:    aspirin EC 81 MG tablet, Take 81 mg by mouth daily., Disp: , Rfl:    atovaquone (MEPRON) 750 MG/5ML suspension, Take by mouth., Disp: , Rfl:    Cholecalciferol 50 MCG (2000 UT) TABS, Take by mouth., Disp: , Rfl:    Drospirenone (SLYND) 4 MG TABS, Take 1 tablet by mouth daily., Disp: 168 tablet, Rfl: 0   famotidine (PEPCID) 20 MG tablet, Take 20 mg by mouth 2 (two) times daily., Disp: , Rfl:    fluticasone (FLONASE) 50 MCG/ACT nasal spray, Place into both nostrils., Disp: , Rfl:    furosemide (LASIX) 20 MG tablet, Take 1 tablet (20 mg total) by mouth daily for 4 days., Disp: 4 tablet, Rfl: 0   gabapentin (NEURONTIN) 300 MG capsule, Take 300 mg by mouth daily. (Patient not taking: Reported on 10/11/2021), Disp: , Rfl:    hydroxychloroquine (PLAQUENIL) 200 MG tablet, Take 1 tablet (200 mg total) by mouth 2 (two) times daily., Disp: 60 tablet, Rfl: 0   MITIGARE 0.6 MG CAPS, Take 0.6 mg by mouth 2 (two) times daily., Disp: , Rfl:    mycophenolate (CELLCEPT) 500 MG tablet, Take 500 mg by mouth 2 (two) times daily. (Patient not taking: Reported on 03/31/2021), Disp: , Rfl:    mycophenolate (MYFORTIC) 360 MG TBEC EC tablet, Take by mouth., Disp: , Rfl:    naproxen (NAPRELAN) 500 MG 24 hr tablet, Take by mouth., Disp: , Rfl:    OZEMPIC, 0.25 OR 0.5 MG/DOSE, 2 MG/1.5ML SOPN, SMARTSIG:0.25 Milligram(s) SUB-Q Once a Week (Patient not taking: Reported on 10/11/2021), Disp: , Rfl:     topiramate (TOPAMAX) 50 MG tablet, Take by mouth. (Patient not taking: Reported on 10/11/2021), Disp: , Rfl:   Past Medical History: Past Medical History:  Diagnosis Date   Anemia    Lupus ([place])    Prediabetes     Tobacco Use: Social History   Tobacco Use  Smoking Status Never  Smokeless Tobacco Never    Labs: Review Flowsheet       Latest Ref Rng & Units 05/05/2018 09/12/2018 08/17/2019 06/16/2020  Labs for ITP Cardiac and Pulmonary Rehab  Cholestrol 100 - 199 mg/dL 116  - - 161   LDL (calc) 0 - 99 mg/dL 69  - - 108   HDL-C >39 mg/dL 24  - - 34   Trlycerides 0 - 149 mg/dL 115  - - 100   Hemoglobin A1c 4.8 - 5.6 % 5.8  5.4  5.5  -     Pulmonary Assessment Scores:  Pulmonary Assessment Scores     Row Name 11/13/21 1048         ADL UCSD   ADL Phase Entry     SOB Score total 41     Rest 0     Walk 2     Stairs 3     Bath 1     Dress 1     Shop 2       CAT  Score   CAT Score 22       mMRC Score   mMRC Score 2              UCSD: Self-administered rating of dyspnea associated with activities of daily living (ADLs) 6-point scale (0 = "not at all" to 5 = "maximal or unable to do because of breathlessness")  Scoring Scores range from 0 to 120.  Minimally important difference is 5 units  CAT: CAT can identify the health impairment of COPD patients and is better correlated with disease progression.  CAT has a scoring range of zero to 40. The CAT score is classified into four groups of low (less than 10), medium (10 - 20), high (21-30) and very high (31-40) based on the impact level of disease on health status. A CAT score over 10 suggests significant symptoms.  A worsening CAT score could be explained by an exacerbation, poor medication adherence, poor inhaler technique, or progression of COPD or comorbid conditions.  CAT MCID is 2 points  mMRC: mMRC (Modified Medical Research Council) Dyspnea Scale is used to assess the degree of baseline functional disability in  patients of respiratory disease due to dyspnea. No minimal important difference is established. A decrease in score of 1 point or greater is considered a positive change.   Pulmonary Function Assessment:   Exercise Target Goals: Exercise Program Goal: Individual exercise prescription set using results from initial 6 min walk test and THRR while considering  patient's activity barriers and safety.   Exercise Prescription Goal: Initial exercise prescription builds to 30-45 minutes a day of aerobic activity, 2-3 days per week.  Home exercise guidelines will be given to patient during program as part of exercise prescription that the participant will acknowledge.  Education: Aerobic Exercise: - Group verbal and visual presentation on the components of exercise prescription. Introduces F.I.T.T principle from ACSM for exercise prescriptions.  Reviews F.I.T.T. principles of aerobic exercise including progression. Written material given at graduation.   Education: Resistance Exercise: - Group verbal and visual presentation on the components of exercise prescription. Introduces F.I.T.T principle from ACSM for exercise prescriptions  Reviews F.I.T.T. principles of resistance exercise including progression. Written material given at graduation. Flowsheet Row Pulmonary Rehab from 11/23/2021 in [facility] Cardiac and Pulmonary Rehab  Date 11/16/21  Educator [facility]  Instruction Review Code 1- Verbalizes Understanding        Education: Exercise & Equipment Safety: - Individual verbal instruction and demonstration of equipment use and safety with use of the equipment. Flowsheet Row Pulmonary Rehab from 11/23/2021 in [facility] Cardiac and Pulmonary Rehab  Date 11/13/21  Educator [facility]  Instruction Review Code 1- Verbalizes Understanding       Education: Exercise Physiology & General Exercise Guidelines: - Group verbal and written instruction with models to review the exercise physiology of the cardiovascular system  and associated critical values. Provides general exercise guidelines with specific guidelines to those with heart or lung disease.    Education: Flexibility, Balance, Mind/Body Relaxation: - Group verbal and visual presentation with interactive activity on the components of exercise prescription. Introduces F.I.T.T principle from ACSM for exercise prescriptions. Reviews F.I.T.T. principles of flexibility and balance exercise training including progression. Also discusses the mind body connection.  Reviews various relaxation techniques to help reduce and manage stress (i.e. Deep breathing, progressive muscle relaxation, and visualization). Balance handout provided to take home. Written material given at graduation. Flowsheet Row Pulmonary Rehab from 11/23/2021 in [facility] Cardiac and Pulmonary Rehab  Date 11/23/21  Educator  [facility]  Instruction Review Code 1- Verbalizes Understanding       Activity Barriers & Risk Stratification:  Activity Barriers & Cardiac Risk Stratification - 11/13/21 1029       Activity Barriers & Cardiac Risk Stratification   Activity Barriers Deconditioning;Muscular Weakness;Shortness of Breath;Balance Concerns             6 Minute Walk:  6 Minute Walk     Row Name 11/13/21 1026         6 Minute Walk   Phase Initial     Distance 354 feet     Walk Time 2.78 minutes  stopped due to desaturation     # of Rest Breaks 0     MPH 1.44     METS 4     RPE 13     Perceived Dyspnea  2     VO2 Peak 14.02     Symptoms Yes (comment)     Comments SOB     Resting HR 111 bpm     Resting BP 112/62     Resting Oxygen Saturation  95 %     Exercise Oxygen Saturation  during 6 min walk 76 %     Max Ex. HR 145 bpm     Max Ex. BP 146/74     2 Minute Post BP 142/70       Interval HR   1 Minute HR 145     2 Minute HR 144     3 Minute HR 138  stopped at 2:47     2 Minute Post HR 136     Interval Heart Rate? Yes       Interval Oxygen   Interval Oxygen? Yes     Baseline  Oxygen Saturation % 95 %     1 Minute Oxygen Saturation % 91 %     1 Minute Liters of Oxygen 6 L     2 Minute Oxygen Saturation % 85 %     2 Minute Liters of Oxygen 10 L     3 Minute Oxygen Saturation % 76 %  stopped at 2:47     3 Minute Liters of Oxygen 10 L     2 Minute Post Oxygen Saturation % 93 %     2 Minute Post Liters of Oxygen 10 L             Oxygen Initial Assessment:  Oxygen Initial Assessment - 11/13/21 1036       Home Oxygen   Home Oxygen Device Home Concentrator;E-Tanks    Sleep Oxygen Prescription Continuous    Liters per minute 2    Home Exercise Oxygen Prescription Continuous    Liters per minute 10    Home Resting Oxygen Prescription Continuous    Liters per minute 6    Compliance with Home Oxygen Use Yes      Initial 6 min Walk   Oxygen Used Continuous;E-Tanks    Liters per minute 10      Program Oxygen Prescription   Program Oxygen Prescription Continuous;E-Tanks    Liters per minute 10      Intervention   Short Term Goals To learn and understand importance of monitoring SPO2 with pulse oximeter and demonstrate accurate use of the pulse oximeter.;To learn and understand importance of maintaining oxygen saturations>88%;To learn and demonstrate proper pursed lip breathing techniques or other breathing techniques. ;To learn and exhibit compliance with exercise, home and travel O2 prescription;To learn and demonstrate proper use  of respiratory medications    Long  Term Goals Verbalizes importance of monitoring SPO2 with pulse oximeter and return demonstration;Maintenance of O2 saturations>88%;Exhibits proper breathing techniques, such as pursed lip breathing or other method taught during program session;Compliance with respiratory medication;Exhibits compliance with exercise, home  and travel O2 prescription;Demonstrates proper use of MDI's             Oxygen Re-Evaluation:  Oxygen Re-Evaluation     Row Name 11/16/21 0843             Program  Oxygen Prescription   Program Oxygen Prescription Continuous;E-Tanks       Liters per minute 10         Home Oxygen   Home Oxygen Device Home Concentrator;E-Tanks       Sleep Oxygen Prescription Continuous       Liters per minute 2       Home Exercise Oxygen Prescription Continuous       Liters per minute 10       Home Resting Oxygen Prescription Continuous       Liters per minute 6       Compliance with Home Oxygen Use Yes         Goals/Expected Outcomes   Short Term Goals To learn and demonstrate proper pursed lip breathing techniques or other breathing techniques.        Long  Term Goals Exhibits proper breathing techniques, such as pursed lip breathing or other method taught during program session       Comments Reviewed PLB technique with pt.  Talked about how it works and it's importance in maintaining their exercise saturations.       Goals/Expected Outcomes Short: Become more profiecient at using PLB.   Long: Become independent at using PLB.                Oxygen Discharge (Final Oxygen Re-Evaluation):  Oxygen Re-Evaluation - 11/16/21 0843       Program Oxygen Prescription   Program Oxygen Prescription Continuous;E-Tanks    Liters per minute 10      Home Oxygen   Home Oxygen Device Home Concentrator;E-Tanks    Sleep Oxygen Prescription Continuous    Liters per minute 2    Home Exercise Oxygen Prescription Continuous    Liters per minute 10    Home Resting Oxygen Prescription Continuous    Liters per minute 6    Compliance with Home Oxygen Use Yes      Goals/Expected Outcomes   Short Term Goals To learn and demonstrate proper pursed lip breathing techniques or other breathing techniques.     Long  Term Goals Exhibits proper breathing techniques, such as pursed lip breathing or other method taught during program session    Comments Reviewed PLB technique with pt.  Talked about how it works and it's importance in maintaining their exercise saturations.     Goals/Expected Outcomes Short: Become more profiecient at using PLB.   Long: Become independent at using PLB.             Initial Exercise Prescription:  Initial Exercise Prescription - 11/13/21 1000       Date of Initial Exercise RX and Referring Provider   Date 11/13/21    Referring Provider Rhonda Moss MD      Oxygen   Oxygen Continuous    Liters 10    Maintain Oxygen Saturation 88% or higher      Treadmill   MPH 1.2  Grade 0.5    Minutes 15    METs 2      Recumbant Bike   Level 2    RPM 50    Watts 15    Minutes 15    METs 2      NuStep   Level 3    SPM 80    Minutes 15    METs 2      T5 Nustep   Level 3    SPM 80    Minutes 15    METs 2      Prescription Details   Frequency (times per week) 2    Duration Progress to 30 minutes of continuous aerobic without signs/symptoms of physical distress      Intensity   THRR 40-80% of Max Heartrate 146-181    Ratings of Perceived Exertion 11-13    Perceived Dyspnea 0-4      Progression   Progression Continue to progress workloads to maintain intensity without signs/symptoms of physical distress.      Resistance Training   Training Prescription Yes    Weight 3 lb    Reps 10-15             Perform Capillary Blood Glucose checks as needed.  Exercise Prescription Changes:   Exercise Prescription Changes     Row Name 11/13/21 1000 11/27/21 1500           Response to Exercise   Blood Pressure (Admit) 112/62 126/70      Blood Pressure (Exercise) 146/74 128/54      Blood Pressure (Exit) 114/64 122/64      Heart Rate (Admit) 111 bpm 139 bpm      Heart Rate (Exercise) 145 bpm 157 bpm      Heart Rate (Exit) 135 bpm 123 bpm      Oxygen Saturation (Admit) 95 % 93 %      Oxygen Saturation (Exercise) 76 % 88 %      Oxygen Saturation (Exit) 95 % 88 %      Rating of Perceived Exertion (Exercise) 13 13      Perceived Dyspnea (Exercise) 2 3      Symptoms SOB, legs weak SOB      Comments walk test  results second full day of exercise      Duration -- Progress to 30 minutes of  aerobic without signs/symptoms of physical distress      Intensity -- THRR unchanged        Progression   Progression -- Continue to progress workloads to maintain intensity without signs/symptoms of physical distress.      Average METs -- 2        Resistance Training   Training Prescription -- Yes      Weight -- 3 lb      Reps -- 10-15        Interval Training   Interval Training -- No        Oxygen   Oxygen -- Continuous      Liters -- 15  Venti mask 55%        Treadmill   MPH -- 0.9      Grade -- 0.5      Minutes -- 15      METs -- 1.7        NuStep   Level -- 1      Minutes -- 15      METs -- 2.3        T5  Nustep   Level -- 3      Minutes -- 15      METs -- 2        Oxygen   Maintain Oxygen Saturation -- 88% or higher               Exercise Comments:   Exercise Comments     Row Name 11/16/21 0843           Exercise Comments First full day of exercise!  Patient was oriented to gym and equipment including functions, settings, policies, and procedures.  Patient's individual exercise prescription and treatment plan were reviewed.  All starting workloads were established based on the results of the 6 minute walk test done at initial orientation visit.  The plan for exercise progression was also introduced and progression will be customized based on patient's performance and goals.                Exercise Goals and Review:   Exercise Goals     Row Name 11/13/21 1033             Exercise Goals   Increase Physical Activity Yes       Intervention Provide advice, education, support and counseling about physical activity/exercise needs.;Develop an individualized exercise prescription for aerobic and resistive training based on initial evaluation findings, risk stratification, comorbidities and participant's personal goals.       Expected Outcomes Short Term: Attend rehab  on a regular basis to increase amount of physical activity.;Long Term: Exercising regularly at least 3-5 days a week.;Long Term: Add in home exercise to make exercise part of routine and to increase amount of physical activity.       Increase Strength and Stamina Yes       Intervention Provide advice, education, support and counseling about physical activity/exercise needs.;Develop an individualized exercise prescription for aerobic and resistive training based on initial evaluation findings, risk stratification, comorbidities and participant's personal goals.       Expected Outcomes Short Term: Increase workloads from initial exercise prescription for resistance, speed, and METs.;Short Term: Perform resistance training exercises routinely during rehab and add in resistance training at home;Long Term: Improve cardiorespiratory fitness, muscular endurance and strength as measured by increased METs and functional capacity (6MWT)       Able to understand and use rate of perceived exertion (RPE) scale Yes       Intervention Provide education and explanation on how to use RPE scale       Expected Outcomes Short Term: Able to use RPE daily in rehab to express subjective intensity level;Long Term:  Able to use RPE to guide intensity level when exercising independently       Able to understand and use Dyspnea scale Yes       Intervention Provide education and explanation on how to use Dyspnea scale       Expected Outcomes Short Term: Able to use Dyspnea scale daily in rehab to express subjective sense of shortness of breath during exertion;Long Term: Able to use Dyspnea scale to guide intensity level when exercising independently       Knowledge and understanding of Target Heart Rate Range (THRR) Yes       Intervention Provide education and explanation of THRR including how the numbers were predicted and where they are located for reference       Expected Outcomes Short Term: Able to state/look up THRR;Long  Term: Able to use THRR to govern intensity when exercising  independently;Short Term: Able to use daily as guideline for intensity in rehab       Able to check pulse independently Yes       Intervention Provide education and demonstration on how to check pulse in carotid and radial arteries.;Review the importance of being able to check your own pulse for safety during independent exercise       Expected Outcomes Short Term: Able to explain why pulse checking is important during independent exercise;Long Term: Able to check pulse independently and accurately       Understanding of Exercise Prescription Yes       Intervention Provide education, explanation, and written materials on patient's individual exercise prescription       Expected Outcomes Short Term: Able to explain program exercise prescription;Long Term: Able to explain home exercise prescription to exercise independently                Exercise Goals Re-Evaluation :  Exercise Goals Re-Evaluation     Row Name 11/16/21 0844 11/27/21 1538 12/11/21 0914         Exercise Goal Re-Evaluation   Exercise Goals Review Able to understand and use Dyspnea scale;Able to understand and use rate of perceived exertion (RPE) scale;Knowledge and understanding of Target Heart Rate Range (THRR);Able to check pulse independently;Understanding of Exercise Prescription Increase Physical Activity;Increase Strength and Stamina;Understanding of Exercise Prescription Increase Physical Activity;Increase Strength and Stamina;Understanding of Exercise Prescription     Comments Reviewed RPE and dyspnea scales, THR and program prescription with pt today.  Pt voiced understanding and was given a copy of goals to take home. Rhonda Franco is off to a good start in rehab.  We have encouraged her to have good attendance to get the most benefit from the program.  She has completed her first two full days of exercise.  We will conitinue to montior her progress. Rhonda Franco has not  been here since last review due to being sick. We hope once she feels better that she can maintain good attendance. Will continue to get updates from patient.     Expected Outcomes Short: Use RPE daily to regulate intensity. Long: Follow program prescription in THR. Short: Attend rehab regularly Long: Continue to follow program prescription Short: Maintain good attendance once better Long: Graduate from rehab with consistent sessions              Discharge Exercise Prescription (Final Exercise Prescription Changes):  Exercise Prescription Changes - 11/27/21 1500       Response to Exercise   Blood Pressure (Admit) 126/70    Blood Pressure (Exercise) 128/54    Blood Pressure (Exit) 122/64    Heart Rate (Admit) 139 bpm    Heart Rate (Exercise) 157 bpm    Heart Rate (Exit) 123 bpm    Oxygen Saturation (Admit) 93 %    Oxygen Saturation (Exercise) 88 %    Oxygen Saturation (Exit) 88 %    Rating of Perceived Exertion (Exercise) 13    Perceived Dyspnea (Exercise) 3    Symptoms SOB    Comments second full day of exercise    Duration Progress to 30 minutes of  aerobic without signs/symptoms of physical distress    Intensity THRR unchanged      Progression   Progression Continue to progress workloads to maintain intensity without signs/symptoms of physical distress.    Average METs 2      Resistance Training   Training Prescription Yes    Weight 3 lb    Reps  10-15      Interval Training   Interval Training No      Oxygen   Oxygen Continuous    Liters 15   Venti mask 55%     Treadmill   MPH 0.9    Grade 0.5    Minutes 15    METs 1.7      NuStep   Level 1    Minutes 15    METs 2.3      T5 Nustep   Level 3    Minutes 15    METs 2      Oxygen   Maintain Oxygen Saturation 88% or higher             Nutrition:  Target Goals: Understanding of nutrition guidelines, daily intake of sodium <1588m, cholesterol <2052m calories 30% from fat and 7% or less from  saturated fats, daily to have 5 or more servings of fruits and vegetables.  Education: All About Nutrition: -Group instruction provided by verbal, written material, interactive activities, discussions, models, and posters to present general guidelines for heart healthy nutrition including fat, fiber, MyPlate, the role of sodium in heart healthy nutrition, utilization of the nutrition label, and utilization of this knowledge for meal planning. Follow up email sent as well. Written material given at graduation. [facility]  Education need identified 05/04/21       Biometrics:  Pre Biometrics - 11/13/21 1035       Pre Biometrics   Height 5' 5.4" (1.661 m)    Weight 214 lb (97.1 kg)    BMI (Calculated) 35.18    Single Leg Stand 3.61 seconds              Nutrition Therapy Plan and Nutrition Goals:  Nutrition Therapy & Goals - 11/13/21 1034       Intervention Plan   Intervention Prescribe, educate and counsel regarding individualized specific dietary modifications aiming towards targeted core components such as weight, hypertension, lipid management, diabetes, heart failure and other comorbidities.    Expected Outcomes Short Term Goal: Understand basic principles of dietary content, such as calories, fat, sodium, cholesterol and nutrients.;Long Term Goal: Adherence to prescribed nutrition plan.;Short Term Goal: A plan has been developed with personal nutrition goals set during dietitian appointment.             Nutrition Assessments:  MEDIFICTS Score Key: ?70 Need to make dietary changes  40-70 Heart Healthy Diet ? 40 Therapeutic Level Cholesterol Diet  [facility]  Picture Your Plate Total Score on Admission 69      Picture Your Plate Scores: <4<28nhealthy dietary pattern with much room for improvement. 41-50 Dietary pattern  unlikely to meet recommendations for good health and room for improvement. 51-60 More healthful dietary pattern, with some room for improvement.  >60 Healthy dietary pattern, although there may be some specific behaviors that could be improved.   Nutrition Goals Re-Evaluation:   Nutrition Goals Discharge (Final Nutrition Goals Re-Evaluation):   Psychosocial: Target Goals: Acknowledge presence or absence of significant depression and/or stress, maximize coping skills, provide positive support system. Participant is able to verbalize types and ability to use techniques and skills needed for reducing stress and depression.   Education: Stress, Anxiety, and Depression - Group verbal and visual presentation to define topics covered.  Reviews how body is impacted by stress, anxiety, and depression.  Also discusses  healthy ways to reduce stress and to treat/manage anxiety and depression.  Written material given at graduation.   Education: Sleep Hygiene -Provides group verbal and written instruction about how sleep can affect your health.  Define sleep hygiene, discuss sleep cycles and impact of sleep habits. Review good sleep hygiene tips.    Initial Review & Psychosocial Screening:  Initial Psych Review & Screening - 10/11/21 1407       Initial Review   Current issues with Current Stress Concerns      Family Dynamics   Good Support System? Yes   mom     Barriers   Psychosocial barriers to participate in program There are no identifiable barriers or psychosocial needs.;The patient should benefit from training in stress management and relaxation.      Screening Interventions   Interventions Encouraged to exercise;To provide support and resources with identified psychosocial needs;Provide feedback about the scores to participant    Expected Outcomes Short Term goal: Utilizing psychosocial counselor, staff and physician to assist with identification of specific Stressors or current issues  interfering with healing process. Setting desired goal for each stressor or current issue identified.;Long Term Goal: Stressors or current issues are controlled or eliminated.;Short Term goal: Identification and review with participant of any Quality of Life or Depression concerns found by scoring the questionnaire.;Long Term goal: The participant improves quality of Life and PHQ9 Scores as seen by post scores and/or verbalization of changes             Quality of Life Scores:  Scores of 19 and below usually indicate a poorer quality of life in these areas.  A difference of  2-3 points is a clinically meaningful difference.  A difference of 2-3 points in the total score of the Quality of Life Index has been associated with significant improvement in overall quality of life, self-image, physical symptoms, and general health in studies assessing change in quality of life.  PHQ-9: Review Flowsheet  More data exists      11/13/2021 05/04/2021 06/16/2020 08/17/2019 07/31/2019  Depression screen PHQ 2/9  Decreased Interest 1 0 1 0 0  Down, Depressed, Hopeless 1 1 0 0 0  PHQ - 2 Score _0 0 0  Altered sleeping 1 1 - - -  Tired, decreased energy 1 2 - - -  Change in appetite 1 3 - - -  Feeling bad or failure about yourself  0 0 - - -  Trouble concentrating 0 0 - - -  Moving slowly or fidgety/restless 0 0 - - -  Suicidal thoughts 0 0 - - -  PHQ-9 Score 5 7 - - -  Difficult doing work/chores Not difficult at all Somewhat difficult - - -   Interpretation of Total Score  Total Score Depression Severity:  1-4 = Minimal depression, 5-9 = Mild depression, 10-14 = Moderate depression, 15-19 = Moderately severe depression, 20-27 = Severe depression   Psychosocial Evaluation and Intervention:  Psychosocial Evaluation - 10/11/21 1418       Psychosocial Evaluation & Interventions   Interventions Relaxation education;Encouraged to exercise with the program and follow exercise prescription;Stress  management education    Comments Rhonda Franco has been referred to pulmonary rehab again after lack of attendance and moving to [place]. Staff encouraged and educated her about importance of attendance and she wants to try again. She was diagnosed with Lupus 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 She has oxygen and wears it when needed  at 6 L, she typically only wears it at home as she does not like to travel with it. Her breathing has continued to worsen which is why she asked to be referred again. Her mother is her main support system. Whenever she tries to be active, her breathing gets in the way. She is hoping this program will help with how she is feeling and prevent future issues    Expected Outcomes Short: attend pulmonary rehab for education and exercise. Long: develop and maintain positive self care habits.    Continue Psychosocial Services  Follow up required by staff             Psychosocial Re-Evaluation:   Psychosocial Discharge (Final Psychosocial Re-Evaluation):   Education: Education Goals: Education classes will be provided on a weekly basis, covering required topics. Participant will state understanding/return demonstration of topics presented.  Learning Barriers/Preferences:  Learning Barriers/Preferences - 10/11/21 1405       Learning Barriers/Preferences   Learning Barriers None    Learning Preferences None             General Pulmonary Education Topics:  Infection Prevention: - Provides verbal and written material to individual with discussion of infection control including proper hand washing and proper equipment cleaning during exercise session. Flowsheet Row Pulmonary Rehab from 11/23/2021 in [facility] Cardiac and Pulmonary Rehab  Date 11/13/21  Educator [facility]  Instruction Review Code 1- Verbalizes Understanding       Falls Prevention: - Provides verbal and written material to individual with discussion of falls prevention and  safety. Flowsheet Row Pulmonary Rehab from 11/23/2021 in [facility] Cardiac and Pulmonary Rehab  Date 11/13/21  Educator [facility]  Instruction Review Code 1- Verbalizes Understanding       Chronic Lung Disease Review: - Group verbal instruction with posters, models, PowerPoint presentations and videos,  to review new updates, new respiratory medications, new advancements in procedures and treatments. Providing information on websites and "800" numbers for continued self-education. Includes information about supplement oxygen, available portable oxygen systems, continuous and intermittent flow rates, oxygen safety, concentrators, and Medicare reimbursement for oxygen. Explanation of Pulmonary Drugs, including class, frequency, complications, importance of spacers, rinsing mouth after steroid MDI's, and proper cleaning methods for nebulizers. Review of basic lung anatomy and physiology related to function, structure, and complications of lung disease. Review of risk factors. Discussion about methods for diagnosing sleep apnea and types of masks and machines for OSA. Includes a review of the use of types of environmental controls: home humidity, furnaces, filters, dust mite/pet prevention, HEPA vacuums. Discussion about weather changes, air quality and the benefits of nasal washing. Instruction on Warning signs, infection symptoms, calling MD promptly, preventive modes, and value of vaccinations. Review of effective airway clearance, coughing and/or vibration techniques. Emphasizing that all should Create an Action Plan. Written material given at graduation. Flowsheet Row Pulmonary Rehab from 11/23/2021 in [facility] Cardiac and Pulmonary Rehab  Education need identified 11/13/21       AED/CPR: - Group verbal and written instruction with the use of models to demonstrate the basic use of the AED with the basic ABC's of resuscitation.    Anatomy and Cardiac Procedures: - Group verbal and visual presentation and models  provide information about basic cardiac anatomy and function. Reviews the testing methods done to diagnose heart disease and the outcomes of the test results. Describes the treatment choices: Medical Management, Angioplasty, or Coronary Bypass Surgery for treating various heart conditions including Myocardial Infarction, Angina, Valve Disease, and Cardiac Arrhythmias.  Written material given at graduation. Flowsheet Row Pulmonary Rehab from 11/23/2021 in [facility] Cardiac and Pulmonary Rehab  Date 11/16/21  Educator [facility]  Instruction Review Code 1- Verbalizes Understanding       Medication Safety: - Group verbal and visual instruction to review commonly prescribed medications for heart and lung disease. Reviews the medication, class of the drug, and side effects. Includes the steps to properly store meds and maintain the prescription regimen.  Written material given at graduation.   Other: -Provides group and verbal instruction on various topics (see comments)   Knowledge Questionnaire Score:  Knowledge Questionnaire Score - 11/13/21 1035       Knowledge Questionnaire Score   Pre Score 17/18              Core Components/Risk Factors/Patient Goals at Admission:  Personal Goals and Risk Factors at Admission - 11/13/21 1035       Core Components/Risk Factors/Patient Goals on Admission    Weight Management Yes;Weight Loss;Obesity    Intervention Weight Management: Provide education and appropriate resources to help participant work on and attain dietary goals.;Weight Management: Develop a combined nutrition and exercise program designed to reach desired caloric intake, while maintaining appropriate intake of nutrient and fiber, sodium and fats, and appropriate energy expenditure required for the weight goal.;Weight Management/Obesity: Establish reasonable short term and long term weight goals.;Obesity: Provide education and appropriate resources to help participant work on and attain dietary  goals.    Admit Weight 214 lb (97.1 kg)    Goal Weight: Short Term 209 lb (94.8 kg)    Goal Weight: Long Term 200 lb (90.7 kg)    Expected Outcomes Long Term: Adherence to nutrition and physical activity/exercise program aimed toward attainment of established weight goal;Short Term: Continue to assess and modify interventions until short term weight is achieved;Weight Loss: Understanding of general recommendations for a balanced deficit meal plan, which promotes 1-2 lb weight loss per week and includes a negative energy balance of [phone] kcal/d;Understanding recommendations for meals to include 15-35% energy as protein, 25-35% energy from fat, 35-60% energy from carbohydrates, less than 214m of dietary cholesterol, 20-35 gm of total fiber daily;Understanding of distribution of calorie intake throughout the day with the consumption of 4-5 meals/snacks    Improve shortness of breath with ADL's Yes    Intervention Provide education, individualized exercise plan and daily activity instruction to help decrease symptoms of SOB with activities of daily living.    Expected Outcomes Short Term: Improve cardiorespiratory fitness to achieve a reduction of symptoms when performing ADLs;Long Term: Be able to perform more ADLs without symptoms or delay the onset of symptoms    Increase knowledge of respiratory medications and ability to use respiratory devices properly  Yes    Intervention Provide education and demonstration as needed of appropriate use of medications, inhalers, and oxygen therapy.    Expected Outcomes Short Term: Achieves understanding of medications use. Understands that oxygen is a medication prescribed by physician. Demonstrates appropriate use of inhaler and oxygen therapy.;Long Term: Maintain appropriate use of medications, inhalers, and oxygen therapy.             Education:Diabetes - Individual verbal and written instruction to review signs/symptoms of diabetes, desired ranges of  glucose level fasting, after meals and with exercise. Acknowledge that pre and post exercise glucose checks will be done for 3 sessions at entry of program.   Know Your Numbers and Heart Failure: - Group verbal and visual instruction to discuss disease risk  factors for cardiac and pulmonary disease and treatment options.  Reviews associated critical values for Overweight/Obesity, Hypertension, Cholesterol, and Diabetes.  Discusses basics of heart failure: signs/symptoms and treatments.  Introduces Heart Failure Zone chart for action plan for heart failure.  Written material given at graduation.   Core Components/Risk Factors/Patient Goals Review:    Core Components/Risk Factors/Patient Goals at Discharge (Final Review):    ITP Comments:  ITP Comments     Row Name 10/11/21 1417 11/13/21 1026 11/16/21 0843 11/22/21 0831 12/12/21 1421   ITP Comments Initial telephone orientation completed. Diagnosis [facility] in [facility] 11/26. EP orientation scheduled for Monday 4/10 at 8am. Completed 6MWT and gym orientation. Initial ITP created and sent for review to Dr. Zetta Bills, Medical Director. First full day of exercise!  Patient was oriented to gym and equipment including functions, settings, policies, and procedures.  Patient's individual exercise prescription and treatment plan were reviewed.  All starting workloads were established based on the results of the 6 minute walk test done at initial orientation visit.  The plan for exercise progression was also introduced and progression will be customized based on patient's performance and goals. 30 Day review completed. Medical Director ITP review done, changes made as directed, and signed approval by Medical Director.    NEW Rhonda Franco left message this morning, returning call.  She has been calling out sick for last two weeks.  She is finally starting to feel better but waiting to hear from pulmonology and rheumatology about why she keeps getting sick.  She  will let us know when she can return.    [place] Name 12/19/21 0725           ITP Comments Rhonda Franco's mom called and left a message this morning that they had to move and are now too far out for her to attend rehab.  We will thus discharge her.                Comments: Discharge ITP

## 2021-12-19 NOTE — Progress Notes (Signed)
Discharge Progress Report  Patient Details  Name: Rhonda Franco MRN: [id number] Date of Birth: April 25, 2001 Referring Provider:   Flowsheet Row Pulmonary Rehab from 11/13/2021 in [facility] Cardiac and Pulmonary Rehab  Referring Provider Vonita Moss MD        Number of Visits: 3  Reason for Discharge:  Early Exit:  Personal and patient moved out of area  Smoking History:  Social History   Tobacco Use  Smoking Status Never  Smokeless Tobacco Never    Diagnosis:  ILD (interstitial lung disease) ([place])  ADL UCSD:  Pulmonary Assessment Scores     Row Name 11/13/21 1048         ADL UCSD   ADL Phase Entry     SOB Score total 41     Rest 0     Walk 2     Stairs 3     Bath 1     Dress 1     Shop 2       CAT Score   CAT Score 22       mMRC Score   mMRC Score 2              Initial Exercise Prescription:  Initial Exercise Prescription - 11/13/21 1000       Date of Initial Exercise RX and Referring Provider   Date 11/13/21    Referring Provider Vonita Moss MD      Oxygen   Oxygen Continuous    Liters 10    Maintain Oxygen Saturation 88% or higher      Treadmill   MPH 1.2    Grade 0.5    Minutes 15    METs 2      Recumbant Bike   Level 2    RPM 50    Watts 15    Minutes 15    METs 2      NuStep   Level 3    SPM 80    Minutes 15    METs 2      T5 Nustep   Level 3    SPM 80    Minutes 15    METs 2      Prescription Details   Frequency (times per week) 2    Duration Progress to 30 minutes of continuous aerobic without signs/symptoms of physical distress      Intensity   THRR 40-80% of Max Heartrate 146-181    Ratings of Perceived Exertion 11-13    Perceived Dyspnea 0-4      Progression   Progression Continue to progress workloads to maintain intensity without signs/symptoms of physical distress.      Resistance Training   Training Prescription Yes    Weight 3 lb    Reps 10-15             Discharge Exercise Prescription  (Final Exercise Prescription Changes):  Exercise Prescription Changes - 11/27/21 1500       Response to Exercise   Blood Pressure (Admit) 126/70    Blood Pressure (Exercise) 128/54    Blood Pressure (Exit) 122/64    Heart Rate (Admit) 139 bpm    Heart Rate (Exercise) 157 bpm    Heart Rate (Exit) 123 bpm    Oxygen Saturation (Admit) 93 %    Oxygen Saturation (Exercise) 88 %    Oxygen Saturation (Exit) 88 %    Rating of Perceived Exertion (Exercise) 13    Perceived Dyspnea (Exercise) 3    Symptoms SOB  Comments second full day of exercise    Duration Progress to 30 minutes of  aerobic without signs/symptoms of physical distress    Intensity THRR unchanged      Progression   Progression Continue to progress workloads to maintain intensity without signs/symptoms of physical distress.    Average METs 2      Resistance Training   Training Prescription Yes    Weight 3 lb    Reps 10-15      Interval Training   Interval Training No      Oxygen   Oxygen Continuous    Liters 15   Venti mask 55%     Treadmill   MPH 0.9    Grade 0.5    Minutes 15    METs 1.7      NuStep   Level 1    Minutes 15    METs 2.3      T5 Nustep   Level 3    Minutes 15    METs 2      Oxygen   Maintain Oxygen Saturation 88% or higher             Functional Capacity:  6 Minute Walk     Row Name 11/13/21 1026         6 Minute Walk   Phase Initial     Distance 354 feet     Walk Time 2.78 minutes  stopped due to desaturation     # of Rest Breaks 0     MPH 1.44     METS 4     RPE 13     Perceived Dyspnea  2     VO2 Peak 14.02     Symptoms Yes (comment)     Comments SOB     Resting HR 111 bpm     Resting BP 112/62     Resting Oxygen Saturation  95 %     Exercise Oxygen Saturation  during 6 min walk 76 %     Max Ex. HR 145 bpm     Max Ex. BP 146/74     2 Minute Post BP 142/70       Interval HR   1 Minute HR 145     2 Minute HR 144     3 Minute HR 138  stopped at 2:47      2 Minute Post HR 136     Interval Heart Rate? Yes       Interval Oxygen   Interval Oxygen? Yes     Baseline Oxygen Saturation % 95 %     1 Minute Oxygen Saturation % 91 %     1 Minute Liters of Oxygen 6 L     2 Minute Oxygen Saturation % 85 %     2 Minute Liters of Oxygen 10 L     3 Minute Oxygen Saturation % 76 %  stopped at 2:47     3 Minute Liters of Oxygen 10 L     2 Minute Post Oxygen Saturation % 93 %     2 Minute Post Liters of Oxygen 10 L              Psychological, QOL, Others - Outcomes: PHQ 2/9:    11/13/2021   10:49 AM 05/04/2021    9:57 AM 06/16/2020    8:49 AM 08/17/2019   10:23 AM 07/31/2019   10:46 AM  Depression screen PHQ 2/9  Decreased Interest 1 0 1  0 0  Down, Depressed, Hopeless 1 1 0 0 0  PHQ - 2 Score _0 0 0  Altered sleeping 1 1     Tired, decreased energy 1 2     Change in appetite 1 3     Feeling bad or failure about yourself  0 0     Trouble concentrating 0 0     Moving slowly or fidgety/restless 0 0     Suicidal thoughts 0 0     PHQ-9 Score 5 7     Difficult doing work/chores Not difficult at all Somewhat difficult          Nutrition & Weight - Outcomes:  Pre Biometrics - 11/13/21 1035       Pre Biometrics   Height 5' 5.4" (1.661 m)    Weight 214 lb (97.1 kg)    BMI (Calculated) 35.18    Single Leg Stand 3.61 seconds              Nutrition:  Nutrition Therapy & Goals - 11/13/21 1034       Intervention Plan   Intervention Prescribe, educate and counsel regarding individualized specific dietary modifications aiming towards targeted core components such as weight, hypertension, lipid management, diabetes, heart failure and other comorbidities.    Expected Outcomes Short Term Goal: Understand basic principles of dietary content, such as calories, fat, sodium, cholesterol and nutrients.;Long Term Goal: Adherence to prescribed nutrition plan.;Short Term Goal: A plan has been developed with personal nutrition goals set during  dietitian appointment.             Nutrition Discharge:   Education Questionnaire Score:  Knowledge Questionnaire Score - 11/13/21 1035       Knowledge Questionnaire Score   Pre Score 17/18             Goals reviewed with patient; copy given to patient.

## 2021-12-28 ENCOUNTER — Telehealth (HOSPITAL_COMMUNITY): Payer: Self-pay

## 2022-02-14 ENCOUNTER — Encounter (INDEPENDENT_AMBULATORY_CARE_PROVIDER_SITE_OTHER): Payer: Self-pay

## 2022-03-01 ENCOUNTER — Telehealth (HOSPITAL_COMMUNITY): Payer: Self-pay

## 2022-03-06 ENCOUNTER — Telehealth (HOSPITAL_COMMUNITY): Payer: Self-pay

## 2022-03-06 NOTE — Telephone Encounter (Signed)
Called pt and left a message to call back for pulmonary rehab, also advised pt on VM that we would need another pulmonary rehab referral from her pulmonologist. I advised pt to call and have one sent to [place].

## 2022-03-13 ENCOUNTER — Encounter (HOSPITAL_COMMUNITY): Payer: Self-pay

## 2022-03-13 ENCOUNTER — Telehealth (HOSPITAL_COMMUNITY): Payer: Self-pay

## 2022-03-13 NOTE — Telephone Encounter (Signed)
Received another referral for pt to attend the pulmonary rehab program

## 2022-03-14 ENCOUNTER — Observation Stay (HOSPITAL_COMMUNITY): Payer: Medicaid Other

## 2022-03-14 ENCOUNTER — Encounter (HOSPITAL_COMMUNITY): Payer: Self-pay | Admitting: Emergency Medicine

## 2022-03-14 ENCOUNTER — Other Ambulatory Visit: Payer: Self-pay

## 2022-03-14 ENCOUNTER — Emergency Department (HOSPITAL_COMMUNITY): Payer: Medicaid Other

## 2022-03-14 ENCOUNTER — Inpatient Hospital Stay (HOSPITAL_COMMUNITY)
Admission: EM | Admit: 2022-03-14 | Discharge: 2022-03-26 | DRG: 545 | Discharge status: Against Medical Advice | Payer: Medicaid Other | Attending: Internal Medicine | Admitting: Internal Medicine

## 2022-03-14 DIAGNOSIS — E66812 Obesity, class 2: Secondary | ICD-10-CM

## 2022-03-14 DIAGNOSIS — I2781 Cor pulmonale (chronic): Secondary | ICD-10-CM | POA: Diagnosis present

## 2022-03-14 DIAGNOSIS — D638 Anemia in other chronic diseases classified elsewhere: Secondary | ICD-10-CM | POA: Diagnosis present

## 2022-03-14 DIAGNOSIS — G4730 Sleep apnea, unspecified: Secondary | ICD-10-CM | POA: Diagnosis present

## 2022-03-14 DIAGNOSIS — Z87410 Personal history of cervical dysplasia: Secondary | ICD-10-CM

## 2022-03-14 DIAGNOSIS — E669 Obesity, unspecified: Secondary | ICD-10-CM | POA: Diagnosis present

## 2022-03-14 DIAGNOSIS — T380X5A Adverse effect of glucocorticoids and synthetic analogues, initial encounter: Secondary | ICD-10-CM | POA: Diagnosis not present

## 2022-03-14 DIAGNOSIS — J9621 Acute and chronic respiratory failure with hypoxia: Secondary | ICD-10-CM | POA: Diagnosis not present

## 2022-03-14 DIAGNOSIS — Z833 Family history of diabetes mellitus: Secondary | ICD-10-CM

## 2022-03-14 DIAGNOSIS — Z881 Allergy status to other antibiotic agents status: Secondary | ICD-10-CM

## 2022-03-14 DIAGNOSIS — E8809 Other disorders of plasma-protein metabolism, not elsewhere classified: Secondary | ICD-10-CM | POA: Diagnosis present

## 2022-03-14 DIAGNOSIS — Z7985 Long-term (current) use of injectable non-insulin antidiabetic drugs: Secondary | ICD-10-CM

## 2022-03-14 DIAGNOSIS — Z880 Allergy status to penicillin: Secondary | ICD-10-CM

## 2022-03-14 DIAGNOSIS — R739 Hyperglycemia, unspecified: Secondary | ICD-10-CM | POA: Diagnosis not present

## 2022-03-14 DIAGNOSIS — Z713 Dietary counseling and surveillance: Secondary | ICD-10-CM

## 2022-03-14 DIAGNOSIS — I2729 Other secondary pulmonary hypertension: Secondary | ICD-10-CM | POA: Diagnosis present

## 2022-03-14 DIAGNOSIS — J810 Acute pulmonary edema: Secondary | ICD-10-CM | POA: Diagnosis not present

## 2022-03-14 DIAGNOSIS — I272 Pulmonary hypertension, unspecified: Secondary | ICD-10-CM | POA: Diagnosis not present

## 2022-03-14 DIAGNOSIS — R030 Elevated blood-pressure reading, without diagnosis of hypertension: Secondary | ICD-10-CM | POA: Diagnosis not present

## 2022-03-14 DIAGNOSIS — Z20822 Contact with and (suspected) exposure to covid-19: Secondary | ICD-10-CM | POA: Diagnosis present

## 2022-03-14 DIAGNOSIS — R7303 Prediabetes: Secondary | ICD-10-CM | POA: Diagnosis present

## 2022-03-14 DIAGNOSIS — Z79899 Other long term (current) drug therapy: Secondary | ICD-10-CM

## 2022-03-14 DIAGNOSIS — J8410 Pulmonary fibrosis, unspecified: Secondary | ICD-10-CM | POA: Diagnosis present

## 2022-03-14 DIAGNOSIS — Z888 Allergy status to other drugs, medicaments and biological substances status: Secondary | ICD-10-CM

## 2022-03-14 DIAGNOSIS — E876 Hypokalemia: Secondary | ICD-10-CM | POA: Diagnosis present

## 2022-03-14 DIAGNOSIS — Z7982 Long term (current) use of aspirin: Secondary | ICD-10-CM

## 2022-03-14 DIAGNOSIS — M3213 Lung involvement in systemic lupus erythematosus: Secondary | ICD-10-CM | POA: Diagnosis not present

## 2022-03-14 DIAGNOSIS — Z6836 Body mass index (BMI) 36.0-36.9, adult: Secondary | ICD-10-CM

## 2022-03-14 DIAGNOSIS — I50812 Chronic right heart failure: Secondary | ICD-10-CM | POA: Diagnosis present

## 2022-03-14 DIAGNOSIS — K5900 Constipation, unspecified: Secondary | ICD-10-CM | POA: Diagnosis not present

## 2022-03-14 DIAGNOSIS — R001 Bradycardia, unspecified: Secondary | ICD-10-CM | POA: Diagnosis present

## 2022-03-14 DIAGNOSIS — Z9981 Dependence on supplemental oxygen: Secondary | ICD-10-CM

## 2022-03-14 DIAGNOSIS — F40240 Claustrophobia: Secondary | ICD-10-CM | POA: Diagnosis present

## 2022-03-14 DIAGNOSIS — N949 Unspecified condition associated with female genital organs and menstrual cycle: Secondary | ICD-10-CM

## 2022-03-14 DIAGNOSIS — J309 Allergic rhinitis, unspecified: Secondary | ICD-10-CM | POA: Diagnosis present

## 2022-03-14 HISTORY — DX: Obesity, class 2: E66.812

## 2022-03-14 LAB — RESP PANEL BY RT-PCR (FLU A&B, COVID) ARPGX2
Influenza A by PCR: NEGATIVE
Influenza B by PCR: NEGATIVE
SARS Coronavirus 2 by RT PCR: NEGATIVE

## 2022-03-14 LAB — GLUCOSE, CAPILLARY
Glucose-Capillary: 131 mg/dL — ABNORMAL HIGH (ref 70–99)
Glucose-Capillary: 194 mg/dL — ABNORMAL HIGH (ref 70–99)
Glucose-Capillary: 82 mg/dL (ref 70–99)
Glucose-Capillary: 86 mg/dL (ref 70–99)

## 2022-03-14 LAB — C-REACTIVE PROTEIN: CRP: 13.7 mg/dL — ABNORMAL HIGH (ref <1.0)

## 2022-03-14 LAB — HEPATIC FUNCTION PANEL
ALT: 15 U/L (ref 0–44)
AST: 20 U/L (ref 15–41)
Albumin: 2.7 g/dL — ABNORMAL LOW (ref 3.5–5.0)
Alkaline Phosphatase: 55 U/L (ref 38–126)
Bilirubin, Direct: <0.1 mg/dL (ref 0.0–0.2)
Total Bilirubin: 0.4 mg/dL (ref 0.3–1.2)
Total Protein: 6.7 g/dL (ref 6.5–8.1)

## 2022-03-14 LAB — BLOOD GAS, VENOUS
Acid-Base Excess: 4.7 mmol/L — ABNORMAL HIGH (ref 0.0–2.0)
Bicarbonate: 31.5 mmol/L — ABNORMAL HIGH (ref 20.0–28.0)
O2 Saturation: 88.2 %
Patient temperature: 37
pCO2, Ven: 57 mmHg (ref 44–60)
pH, Ven: 7.35 (ref 7.25–7.43)
pO2, Ven: 59 mmHg — ABNORMAL HIGH (ref 32–45)

## 2022-03-14 LAB — BASIC METABOLIC PANEL
Anion gap: 6 (ref 5–15)
BUN: 6 mg/dL (ref 6–20)
CO2: 28 mmol/L (ref 22–32)
Calcium: 8.5 mg/dL — ABNORMAL LOW (ref 8.9–10.3)
Chloride: 107 mmol/L (ref 98–111)
Creatinine, Ser: 0.48 mg/dL (ref 0.44–1.00)
GFR, Estimated: >60 mL/min (ref >60)
Glucose, Bld: 106 mg/dL — ABNORMAL HIGH (ref 70–99)
Potassium: 3.5 mmol/L (ref 3.5–5.1)
Sodium: 141 mmol/L (ref 135–145)

## 2022-03-14 LAB — CBC WITH DIFFERENTIAL/PLATELET
Abs Immature Granulocytes: 0.11 10*3/uL — ABNORMAL HIGH (ref 0.00–0.07)
Basophils Absolute: 0.1 10*3/uL (ref 0.0–0.1)
Basophils Relative: 0 %
Eosinophils Absolute: 0.5 10*3/uL (ref 0.0–0.5)
Eosinophils Relative: 3 %
HCT: 37.2 % (ref 36.0–46.0)
Hemoglobin: 10.9 g/dL — ABNORMAL LOW (ref 12.0–15.0)
Immature Granulocytes: 1 %
Lymphocytes Relative: 3 %
Lymphs Abs: 0.7 10*3/uL (ref 0.7–4.0)
MCH: 25.4 pg — ABNORMAL LOW (ref 26.0–34.0)
MCHC: 29.3 g/dL — ABNORMAL LOW (ref 30.0–36.0)
MCV: 86.7 fL (ref 80.0–100.0)
Monocytes Absolute: 1.5 10*3/uL — ABNORMAL HIGH (ref 0.1–1.0)
Monocytes Relative: 7 %
Neutro Abs: 18.1 10*3/uL — ABNORMAL HIGH (ref 1.7–7.7)
Neutrophils Relative %: 86 %
Platelets: 495 10*3/uL — ABNORMAL HIGH (ref 150–400)
RBC: 4.29 MIL/uL (ref 3.87–5.11)
RDW: 14.6 % (ref 11.5–15.5)
WBC: 21 10*3/uL — ABNORMAL HIGH (ref 4.0–10.5)
nRBC: 0 % (ref 0.0–0.2)

## 2022-03-14 LAB — PROTIME-INR
INR: 1.3 — ABNORMAL HIGH (ref 0.8–1.2)
Prothrombin Time: 15.6 seconds — ABNORMAL HIGH (ref 11.4–15.2)

## 2022-03-14 LAB — LACTIC ACID, PLASMA: Lactic Acid, Venous: 0.9 mmol/L (ref 0.5–1.9)

## 2022-03-14 LAB — MAGNESIUM: Magnesium: 1.9 mg/dL (ref 1.7–2.4)

## 2022-03-14 LAB — BRAIN NATRIURETIC PEPTIDE: B Natriuretic Peptide: 307.6 pg/mL — ABNORMAL HIGH (ref 0.0–100.0)

## 2022-03-14 LAB — PHOSPHORUS: Phosphorus: 3.7 mg/dL (ref 2.5–4.6)

## 2022-03-14 LAB — HEMOGLOBIN A1C
Hgb A1c MFr Bld: 5.7 % — ABNORMAL HIGH (ref 4.8–5.6)
Mean Plasma Glucose: 116.89 mg/dL

## 2022-03-14 MED ORDER — FUROSEMIDE 10 MG/ML IJ SOLN
120.0000 mg | Freq: Once | INTRAVENOUS | Status: AC
  Administered 2022-03-14: 120 mg via INTRAVENOUS
  Filled 2022-03-14: qty 10

## 2022-03-14 MED ORDER — TREPROSTINIL 0.6 MG/ML IN SOLN: 18.0000 ug | Freq: Four times a day (QID) | RESPIRATORY_TRACT | Status: DC

## 2022-03-14 MED ORDER — ATOVAQUONE 750 MG/5ML PO SUSP
1500.0000 mg | Freq: Every day | ORAL | Status: DC
  Administered 2022-03-16 – 2022-03-26 (×11): 1500 mg via ORAL
  Filled 2022-03-14 (×12): qty 10

## 2022-03-14 MED ORDER — ENOXAPARIN SODIUM 60 MG/0.6ML IJ SOSY
50.0000 mg | PREFILLED_SYRINGE | Freq: Every day | INTRAMUSCULAR | Status: DC
  Administered 2022-03-14 – 2022-03-26 (×13): 50 mg via SUBCUTANEOUS
  Filled 2022-03-14 (×14): qty 0.6

## 2022-03-14 MED ORDER — IOHEXOL 350 MG/ML SOLN: 100.0000 mL | Freq: Once | INTRAVENOUS | Status: DC | PRN

## 2022-03-14 MED ORDER — LEVOFLOXACIN IN D5W 750 MG/150ML IV SOLN
750.0000 mg | Freq: Once | INTRAVENOUS | Status: DC
  Administered 2022-03-14: 750 mg via INTRAVENOUS
  Filled 2022-03-14: qty 150

## 2022-03-14 MED ORDER — FUROSEMIDE 10 MG/ML IJ SOLN: 20.0000 mg | Freq: Every day | INTRAMUSCULAR | Status: DC

## 2022-03-14 MED ORDER — ALUM & MAG HYDROXIDE-SIMETH 200-200-20 MG/5ML PO SUSP: 30.0000 mL | ORAL | Status: DC | PRN

## 2022-03-14 MED ORDER — FUROSEMIDE 10 MG/ML IJ SOLN: 20.0000 mg | Freq: Once | INTRAMUSCULAR | Status: DC

## 2022-03-14 MED ORDER — MAGNESIUM SULFATE 2 GM/50ML IV SOLN
2.0000 g | Freq: Once | INTRAVENOUS | Status: AC
  Administered 2022-03-14: 2 g via INTRAVENOUS
  Filled 2022-03-14: qty 50

## 2022-03-14 MED ORDER — POTASSIUM CHLORIDE CRYS ER 20 MEQ PO TBCR: 40.0000 meq | EXTENDED_RELEASE_TABLET | Freq: Once | ORAL | Status: DC

## 2022-03-14 MED ORDER — FAMOTIDINE 20 MG PO TABS
20.0000 mg | ORAL_TABLET | Freq: Two times a day (BID) | ORAL | Status: DC
  Administered 2022-03-14 – 2022-03-26 (×24): 20 mg via ORAL
  Filled 2022-03-14 (×24): qty 1

## 2022-03-14 MED ORDER — LORAZEPAM 2 MG/ML IJ SOLN: 0.5000 mg | Freq: Once | INTRAMUSCULAR | Status: DC

## 2022-03-14 MED ORDER — ONDANSETRON HCL 4 MG/2ML IJ SOLN
4.0000 mg | Freq: Four times a day (QID) | INTRAMUSCULAR | Status: DC | PRN
  Administered 2022-03-17: 4 mg via INTRAVENOUS
  Filled 2022-03-14: qty 2

## 2022-03-14 MED ORDER — LORAZEPAM 2 MG/ML IJ SOLN
0.5000 mg | INTRAMUSCULAR | Status: DC | PRN
Start: 2022-03-14 — End: 2022-03-21
  Administered 2022-03-17 – 2022-03-20 (×3): 0.5 mg via INTRAVENOUS
  Filled 2022-03-14 (×3): qty 1

## 2022-03-14 MED ORDER — ORAL CARE MOUTH RINSE: 15.0000 mL | OROMUCOSAL | Status: DC | PRN

## 2022-03-14 MED ORDER — FUROSEMIDE 10 MG/ML IJ SOLN
40.0000 mg | Freq: Once | INTRAMUSCULAR | Status: AC
  Administered 2022-03-14: 40 mg via INTRAVENOUS
  Filled 2022-03-14: qty 4

## 2022-03-14 MED ORDER — LEVALBUTEROL HCL 1.25 MG/0.5ML IN NEBU
1.2500 mg | INHALATION_SOLUTION | Freq: Four times a day (QID) | RESPIRATORY_TRACT | Status: DC | PRN
  Administered 2022-03-16: 1.25 mg via RESPIRATORY_TRACT
  Filled 2022-03-14: qty 0.5

## 2022-03-14 MED ORDER — ACETAMINOPHEN 650 MG RE SUPP: 650.0000 mg | Freq: Four times a day (QID) | RECTAL | Status: DC | PRN

## 2022-03-14 MED ORDER — CHLORHEXIDINE GLUCONATE CLOTH 2 % EX PADS
6.0000 | MEDICATED_PAD | Freq: Every day | CUTANEOUS | Status: DC
  Administered 2022-03-14 – 2022-03-26 (×13): 6 via TOPICAL

## 2022-03-14 MED ORDER — ACETAMINOPHEN 325 MG PO TABS
650.0000 mg | ORAL_TABLET | Freq: Four times a day (QID) | ORAL | Status: DC | PRN
  Administered 2022-03-14 – 2022-03-24 (×2): 650 mg via ORAL
  Filled 2022-03-14 (×2): qty 2

## 2022-03-14 MED ORDER — LORAZEPAM 2 MG/ML IJ SOLN
0.5000 mg | Freq: Once | INTRAMUSCULAR | Status: AC
Start: 2022-03-14 — End: 2022-03-14
  Administered 2022-03-14: 0.5 mg via INTRAVENOUS
  Filled 2022-03-14: qty 1

## 2022-03-14 MED ORDER — LEVALBUTEROL HCL 1.25 MG/0.5ML IN NEBU: 1.2500 mg | INHALATION_SOLUTION | Freq: Four times a day (QID) | RESPIRATORY_TRACT | Status: DC

## 2022-03-14 MED ORDER — ONDANSETRON HCL 4 MG PO TABS: 4.0000 mg | ORAL_TABLET | Freq: Four times a day (QID) | ORAL | Status: DC | PRN

## 2022-03-14 MED ORDER — SODIUM CHLORIDE (PF) 0.9 % IJ SOLN
INTRAMUSCULAR | Status: AC
  Filled 2022-03-14: qty 50

## 2022-03-14 MED ORDER — INSULIN ASPART 100 UNIT/ML IJ SOLN
0.0000 [IU] | INTRAMUSCULAR | Status: DC
  Administered 2022-03-14: 3 [IU] via SUBCUTANEOUS
  Administered 2022-03-14: 2 [IU] via SUBCUTANEOUS
  Administered 2022-03-15 (×2): 3 [IU] via SUBCUTANEOUS
  Administered 2022-03-16 (×2): 2 [IU] via SUBCUTANEOUS
  Administered 2022-03-16: 3 [IU] via SUBCUTANEOUS
  Administered 2022-03-16: 5 [IU] via SUBCUTANEOUS
  Administered 2022-03-17 (×2): 2 [IU] via SUBCUTANEOUS
  Administered 2022-03-18: 5 [IU] via SUBCUTANEOUS
  Administered 2022-03-18: 2 [IU] via SUBCUTANEOUS
  Administered 2022-03-19: 5 [IU] via SUBCUTANEOUS

## 2022-03-14 MED ORDER — ASPIRIN 81 MG PO TBEC
81.0000 mg | DELAYED_RELEASE_TABLET | Freq: Every day | ORAL | Status: DC
  Administered 2022-03-15 – 2022-03-26 (×12): 81 mg via ORAL
  Filled 2022-03-14 (×12): qty 1

## 2022-03-14 MED ORDER — ORAL CARE MOUTH RINSE
15.0000 mL | OROMUCOSAL | Status: DC
  Administered 2022-03-14 – 2022-03-26 (×44): 15 mL via OROMUCOSAL

## 2022-03-14 MED ORDER — METHYLPREDNISOLONE SODIUM SUCC 125 MG IJ SOLR
80.0000 mg | Freq: Every day | INTRAMUSCULAR | Status: DC
  Administered 2022-03-14: 80 mg via INTRAVENOUS
  Filled 2022-03-14: qty 2

## 2022-03-14 MED ORDER — HYDROXYCHLOROQUINE SULFATE 200 MG PO TABS
200.0000 mg | ORAL_TABLET | Freq: Two times a day (BID) | ORAL | Status: DC
  Administered 2022-03-14 – 2022-03-26 (×24): 200 mg via ORAL
  Filled 2022-03-14 (×24): qty 1

## 2022-03-14 MED ORDER — TREPROSTINIL 0.6 MG/ML IN SOLN
72.0000 ug | Freq: Four times a day (QID) | RESPIRATORY_TRACT | Status: DC
  Administered 2022-03-15: 72 ug via RESPIRATORY_TRACT

## 2022-03-14 NOTE — Consult Note (Signed)
NAME:  Rhonda Franco, MRN:  [id number], DOB:  April 04, 2001, LOS: 0 ADMISSION DATE:  03/14/2022, CONSULTATION DATE:  03/14/22 REFERRING MD:  Olevia Bowens CHIEF COMPLAINT:  Dyspnea   History of Present Illness:  Rhonda Franco is a 21 y.o. female who has a PMH as outlined below including but not limited to [facility] and SLE related ILD (followed at [facility] and has had mention of transplant consideration; however, not at goal weight yet). She uses 3 L of oxygen at rest and up to 10 L with activity. She has been managed with Myfortic starting in May 2022 along with prednisone and was switched to Cytoxan. Recently, she was started on Tyvaso. Per her last notes from [facility] (dated 12/20/21), she is currently on Atovaquone Hydroxychloroquine, Mycophenolate, Prednisone.  She presented to [facility] ED 9/6 with worsening dyspnea. She apparently had her Prednisone decreased from 50m to 7.56mdaily 3 - 4 weeks ago. Since then she has had progressive dyspnea. [facility] and was given Lasix and a dose of abx.  In ED today, she was noted to have distress and hypoxia. She was placed on BiPAP which she initially declined but later agreed to. She received [id number] and has yet to have any UOP. CXR showed bilateral ASD concerning for edema vs infection along with chronic interstitial changes with honeycombing.  She continued to have respiratory rates in the 40's and 50's; hence, PCCM called in consultation.  She is currently anxious but is tolerating BiPAP ok. She does endorse anxiety and claustrophobia and she did receive 0.5mg22mivan earlier to assist with comfort of anxious and claustrophobia.  Pertinent  Medical History:  has Pericardial effusion; Family history of diabetes mellitus; Allergic rhinitis; Acanthosis nigricans; BMI 40.0-44.9, adult (HCC)[place] discharge; High risk heterosexual behavior; Class 3 severe obesity due to excess calories without serious comorbidity with body mass index (BMI) of  40.0 to 44.9 in adult ([facility]; Elevated blood pressure reading without diagnosis of hypertension; Interstitial lung disease due to connective tissue disease (HCC)[place] lupus erythematosus with lung involvement (HCC)[place] lupus erythematosus, unspecified (HCC)[place] on chronic respiratory failure with hypoxia (HCC)[place] chest pain; Acute right ventricular heart failure (HCC)[place] hypertension (HCC)[place] blood cell antibody positive; Sleep apnea; Keloid scar; High risk social situation; Dysplasia of cervix, low grade (CIN 1); Myositis; [place] fibroma; and Class 2 obesity on their problem list.  Significant Hospital Events: Including procedures, antibiotic start and stop dates in addition to other pertinent events   9/6 admit.  Interim History / Subjective:  Comfortable on BiPAP.  She does report some anxiety and claustrophobia.  She received 0.5 mg Ativan earlier.  Asking to eat and drink.  Objective:  Blood pressure 106/62, pulse (!) 151, temperature 98.8 F (37.1 C), temperature source Oral, resp. rate (!) 36, height _0  (1.651 m), weight 98.4 kg, SpO2 97 %.       No intake or output data in the 24 hours ending 03/14/22 1308 Filed Weights   03/14/22 0834  Weight: 98.4 kg    Examination: General: Young adult female, resting in bed, in NAD. Neuro: A&O x 3, no deficits. HEENT: [place]/AT. Sclerae anicteric. EOMI. BiPAP in place. Cardiovascular: Tachy, regular, no M/R/G.  Lungs: Respirations rapid and labored.  CTA bilaterally, No W/R/R. Abdomen: Obese. BS x 4, soft, NT/ND.  Musculoskeletal: No gross deformities, no edema.  Skin: Intact, warm, no rashes.   Assessment & Plan:   Acute  on chronic hypoxic respiratory failure - secondary to baseline pulmonary hypertension and ILD in the setting of SLE. - Continue supplemental oxygen to maintain O2 saturations greater than 90%.   - Continue BiPAP for now for increased work of breathing.   Would recommend overnight as well. - High risk for intubation if can not tolerate BiPAP or declines. We did explain this to pt at bedside. - Low dose Ativan PRN if needed for anxiolysis and to allow for BiPAP comfort. - Check CRP (5.86 at [facility] on 01/16/22), RVP. - We will start Solu-Medrol 80 mg a day x 3 days then wean to 40 mg for a week or so before weaning her back to her baseline of 10 mg prednisone. - Continue home Atovaquone, Hydroxychloroquine. - Maintain net negative balance. - Routine bronchial hygiene. - Follow CXR. - Continue home. - Hold home Mycophenolate for now. - F/u with [facility].  At risk for steroid induced hyperglycemia. - SSI.   Rest per primary team.  Best practice (evaluated daily):  Per primary team.  Labs   CBC: Recent Labs  Lab 03/14/22 0846  WBC 21.0*  NEUTROABS 18.1*  HGB 10.9*  HCT 37.2  MCV 86.7  PLT 495*    Basic Metabolic Panel: Recent Labs  Lab 03/14/22 0846  NA 141  K 3.5  CL 107  CO2 28  GLUCOSE 106*  BUN 6  CREATININE 0.48  CALCIUM 8.5*  MG 1.9  PHOS 3.7   GFR: Estimated Creatinine Clearance: 128.2 mL/min (by C-G formula based on SCr of 0.48 mg/dL). Recent Labs  Lab 03/14/22 0846 03/14/22 0847  WBC 21.0*  --   LATICACIDVEN  --  0.9    Liver Function Tests: Recent Labs  Lab 03/14/22 0846  AST 20  ALT 15  ALKPHOS 55  BILITOT 0.4  PROT 6.7  ALBUMIN 2.7*   No results for input(s): "LIPASE", "AMYLASE" in the last 168 hours. No results for input(s): "AMMONIA" in the last 168 hours.  ABG    Component Value Date/Time   HCO3 31.5 (H) 03/14/2022 0900   O2SAT 88.2 03/14/2022 0900     Coagulation Profile: No results for input(s): "INR", "PROTIME" in the last 168 hours.  Cardiac Enzymes: No results for input(s): "CKTOTAL", "CKMB", "CKMBINDEX", "TROPONINI" in the last 168 hours.  HbA1C: Hemoglobin A1C  Date/Time Value Ref Range Status  09/12/2018 09:37 AM 5.4 4.0 - 5.6 % Final   Hgb A1c MFr Bld  Date/Time  Value Ref Range Status  08/17/2019 12:15 PM 5.5 4.8 - 5.6 % Final    Comment:             Prediabetes: 5.7 - 6.4          Diabetes: >6.4          Glycemic control for adults with diabetes: <7.0   05/05/2018 10:31 AM 5.8 (H) 4.8 - 5.6 % Final    Comment:             Prediabetes: 5.7 - 6.4          Diabetes: >6.4          Glycemic control for adults with diabetes: <7.0     CBG: No results for input(s): "GLUCAP" in the last 168 hours.  Review of Systems:   All negative; except for those that are bolded, which indicate positives.  Constitutional: weight loss, weight gain, night sweats, fevers, chills, fatigue, weakness.  HEENT: headaches, sore throat, sneezing, nasal congestion, post nasal drip, difficulty swallowing,  tooth/dental problems, visual complaints, visual changes, ear aches. Neuro: difficulty with speech, weakness, numbness, ataxia. CV:  chest pain, orthopnea, PND, swelling in lower extremities, dizziness, palpitations, syncope.  Resp: cough, hemoptysis, dyspnea, wheezing. GI: heartburn, indigestion, abdominal pain, nausea, vomiting, diarrhea, constipation, change in bowel habits, loss of appetite, hematemesis, melena, hematochezia.  GU: dysuria, change in color of urine, urgency or frequency, flank pain, hematuria. MSK: joint pain or swelling, decreased range of motion. Psych: change in mood or affect, depression, anxiety, suicidal ideations, homicidal ideations. Skin: rash, itching, bruising.   Past Medical History:  She,  has a past medical history of Anemia, Lupus ([place]), and Prediabetes.   Surgical History:  History reviewed. No pertinent surgical history.   Social History:   reports that she has never smoked. She has never used smokeless tobacco. She reports that she does not drink alcohol and does not use drugs.   Family History:  Her family history includes Arthritis in her mother; Diabetes in her maternal aunt, maternal grandfather, and mother; Heart disease  in her maternal grandfather; Stroke in her paternal grandmother.   Allergies Allergies  Allergen Reactions   Ampicillin Anaphylaxis, Swelling, Palpitations and Rash   Cephalexin Anaphylaxis    Throat swelling   Other Shortness Of Breath   Penicillins Anaphylaxis, Swelling, Palpitations and Rash    Has patient had a PCN reaction causing immediate rash, facial/tongue/throat swelling, SOB or lightheadedness with hypotension: Yes Has patient had a PCN reaction causing severe rash involving mucus membranes or skin necrosis: Yes Has patient had a PCN reaction that required hospitalization: Yes Has patient had a PCN reaction occurring within the last 10 years: Yes If all of the above answers are "NO", then may proceed with Cephalosporin use.    Bactrim [Sulfamethoxazole-Trimethoprim] Rash     Home Medications  Prior to Admission medications   Medication Sig Start Date End Date Taking? Authorizing Provider  aspirin EC 81 MG tablet Take 81 mg by mouth daily.    [provider]  atovaquone [facility]) 750 MG/5ML suspension Take by mouth. 03/08/21   [provider]  Cholecalciferol 50 MCG (2000 UT) TABS Take by mouth. 07/06/21   [provider]  Drospirenone (SLYND) 4 MG TABS Take 1 tablet by mouth daily. 04/27/20   Lajean Manes, CNM  famotidine (PEPCID) 20 MG tablet Take 20 mg by mouth 2 (two) times daily. 05/18/21   [provider]  fluticasone (FLONASE) 50 MCG/ACT nasal spray Place into both nostrils. 01/10/21   [provider]  furosemide (LASIX) 20 MG tablet Take 1 tablet (20 mg total) by mouth daily for 4 days. 10/16/21 10/20/21  Gareth Morgan, MD  gabapentin (NEURONTIN) 300 MG capsule Take 300 mg by mouth daily. Patient not taking: Reported on 10/11/2021 03/25/21   [provider]  hydroxychloroquine (PLAQUENIL) 200 MG tablet Take 1 tablet (200 mg total) by mouth 2 (two) times daily. 01/18/18   Upstill, Shari, PA-C  MITIGARE 0.6 MG CAPS  Take 0.6 mg by mouth 2 (two) times daily. 09/04/18   [provider]  mycophenolate (CELLCEPT) 500 MG tablet Take 500 mg by mouth 2 (two) times daily. Patient not taking: Reported on 03/31/2021 05/18/19   [provider]  naproxen (NAPRELAN) 500 MG 24 hr tablet Take by mouth. 09/08/21 09/08/22  [provider]  OZEMPIC, 0.25 OR 0.5 MG/DOSE, 2 MG/1.5ML SOPN SMARTSIG:0.25 Milligram(s) SUB-Q Once a Week Patient not taking: Reported on 10/11/2021 02/24/21   [provider]  topiramate (TOPAMAX) 50  MG tablet Take by mouth. Patient not taking: Reported on 10/11/2021 12/19/20   [provider]     Critical care time: 35 min.   Montey Hora, [facility] For pager details, please see AMION or use Epic chat  After 1900, please call [facility] for cross coverage needs 03/14/2022, 1:08 PM

## 2022-03-14 NOTE — H&P (Addendum)
History and Physical    Patient: Rhonda Franco SAY:[id number] DOB: Sep 14, 2000 DOA: 03/14/2022 DOS: the patient was seen and examined on 03/14/2022 PCP: Pcp, No  Patient coming from: Home  Chief Complaint:  Chief Complaint  Patient presents with   Shortness of Breath   HPI: Rhonda Franco is a 21 y.o. 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  She is currently on atovaquone, hydroxychloroquine and prednisone 7.5 mg p.o. daily.  Prednisone being tapered down from 10 mg daily for the past week.  She was recently started on Tyvaso.  She is unable to provide further history due to being on BiPAP ventilation due to respiratory distress.  ED course: Initial vital signs were temperature 98.8 F, pulse 133, respiration 26, BP 121/92 mmHg and O2 sat 96% on NRB at 15 LPM.  The patient received furosemide 40 mg IVP, lorazepam 0.5 mg IVP and was started on BiPAP ventilation.  Lab work: Venous blood gas showed a PO2 of 59 mmHg, bicarbonate of 31.5 and acid base SS of 4.7 mmol/L.  CBC showed a white count 21,000 with 86% neutrophils, hemoglobin 10.9 g/dL platelets 495.  BMP with glucose of 106 mg/dL, the rest of the measurements are normal once calcium is corrected.  LFTs were normal except for an albumin of 2.7 g/dL.  Coronavirus and influenza PCR was negative.  Lactic acid was normal.  BNP 307.6 pg/mL.  EKG shows sinus tachycardia at 140 bpm.  Imaging: A 2 view chest radiograph show worsening airspace disease in both lungs nonspecific or infection versus pulmonary edema.  Prominent pulmonary artery compatible  with pulmonary arterial hypertension and there is reticular interstitial accentuation in the lungs compatible with honeycombing.   Review of Systems: As mentioned in the history of present illness. All other systems reviewed and are negative.  Past Medical History:  Diagnosis Date   Anemia    Lupus ([place])    Prediabetes    History reviewed. No pertinent surgical history. Social History:  reports that she has never smoked. She has never used smokeless tobacco. She reports that she does not drink alcohol and does not use drugs.  Allergies  Allergen Reactions   Ampicillin Anaphylaxis, Swelling, Palpitations and Rash   Cephalexin Anaphylaxis    Throat swelling   Other Shortness Of Breath   Penicillins Anaphylaxis, Swelling, Palpitations and Rash    Has patient had a PCN reaction causing immediate rash, facial/tongue/throat swelling, SOB or lightheadedness with hypotension: Yes Has patient had a PCN reaction causing severe rash involving mucus membranes or skin necrosis: Yes Has patient had a PCN reaction that required hospitalization: Yes Has patient had a PCN reaction occurring within the last 10 years: Yes If all of the above answers are "NO", then may proceed with Cephalosporin use.    Bactrim [Sulfamethoxazole-Trimethoprim] Rash    Family History  Problem Relation Age of Onset   Diabetes Mother    Arthritis Mother    Diabetes Maternal Aunt    Diabetes Maternal Grandfather    Heart disease Maternal Grandfather    Stroke Paternal Grandmother     Prior to Admission medications   Medication Sig Start Date End  Date Taking? Authorizing Provider  aspirin EC 81 MG tablet Take 81 mg by mouth daily.    [provider]  atovaquone [facility]) 750 MG/5ML suspension Take by mouth. 03/08/21   [provider]  Cholecalciferol 50 MCG (2000 UT) TABS Take by mouth. 07/06/21   [provider]  Drospirenone (SLYND) 4 MG TABS Take 1 tablet by mouth daily. 04/27/20    Lajean Manes, CNM  famotidine (PEPCID) 20 MG tablet Take 20 mg by mouth 2 (two) times daily. 05/18/21   [provider]  fluticasone (FLONASE) 50 MCG/ACT nasal spray Place into both nostrils. 01/10/21   [provider]  furosemide (LASIX) 20 MG tablet Take 1 tablet (20 mg total) by mouth daily for 4 days. 10/16/21 10/20/21  Gareth Morgan, MD  gabapentin (NEURONTIN) 300 MG capsule Take 300 mg by mouth daily. Patient not taking: Reported on 10/11/2021 03/25/21   [provider]  hydroxychloroquine (PLAQUENIL) 200 MG tablet Take 1 tablet (200 mg total) by mouth 2 (two) times daily. 01/18/18   Upstill, Shari, PA-C  MITIGARE 0.6 MG CAPS Take 0.6 mg by mouth 2 (two) times daily. 09/04/18   [provider]  mycophenolate (CELLCEPT) 500 MG tablet Take 500 mg by mouth 2 (two) times daily. Patient not taking: Reported on 03/31/2021 05/18/19   [provider]  naproxen (NAPRELAN) 500 MG 24 hr tablet Take by mouth. 09/08/21 09/08/22  [provider]  OZEMPIC, 0.25 OR 0.5 MG/DOSE, 2 MG/1.5ML SOPN SMARTSIG:0.25 Milligram(s) SUB-Q Once a Week Patient not taking: Reported on 10/11/2021 02/24/21   [provider]  topiramate (TOPAMAX) 50 MG tablet Take by mouth. Patient not taking: Reported on 10/11/2021 12/19/20   [provider]    Physical Exam: Vitals:   03/14/22 0945 03/14/22 1045 03/14/22 1230 03/14/22 1245  BP: 132/60 117/85 106/62   Pulse: (!) 132 (!) 145 (!) 155 (!) 151  Resp: (!) 38 (!) 33 (!) 51 (!) 36  Temp:      TempSrc:      SpO2: 100% 98% 94% 97%  Weight:      Height:       Physical Exam Vitals and nursing note reviewed.  Constitutional:      Appearance: She is well-developed. She is obese. She is ill-appearing.     Interventions: Face mask in place.     Comments: BiPAP mask on.  HENT:     Head: Normocephalic.     Mouth/Throat:     Mouth: Mucous membranes are dry.  Eyes:     General: No scleral icterus.    Pupils:  Pupils are equal, round, and reactive to light.  Cardiovascular:     Rate and Rhythm: Regular rhythm. Tachycardia present.  Pulmonary:     Effort: Pulmonary effort is normal. Tachypnea present.     Breath sounds: Examination of the right-middle field reveals decreased breath sounds. Examination of the left-middle field reveals decreased breath sounds. Examination of the right-lower field reveals decreased breath sounds. Examination of the left-lower field reveals decreased breath sounds. Decreased breath sounds and rales present. No rhonchi.  Abdominal:     General: Bowel sounds are normal. There is no distension.     Palpations: Abdomen is soft.     Tenderness: There is no abdominal tenderness.  Musculoskeletal:     Cervical back: Neck supple.     Right lower leg: No edema.     Left lower leg: No edema.  Skin:    General: Skin is  warm and dry.  Neurological:     General: No focal deficit present.     Mental Status: She is alert and oriented to person, place, and time.  Psychiatric:        Mood and Affect: Mood is anxious.     Data Reviewed:  Results are pending, will review when available.  Assessment and Plan: Principal Problem:   Acute on chronic respiratory failure with hypoxia (HCC) In the setting of:   Systemic lupus erythematosus with lung involvement ([place]) And:   Acute pulmonary edema (HCC) Observation/stepdown. Continue supplemental oxygen.   Continue BiPAP as needed and bedtime. Continue Solu-Medrol 80 mg IVP twice daily. Sodium and fluid restriction. Continue furosemide. Monitor daily weights, intake and output. No beta-blocker due to acute decompensation. Check echocardiogram. Lorazepam as needed for anxiety. Continue methylprednisolone 80 mg BID.  Active Problems:   Pulmonary hypertension (HCC) Continue Tyvaso 18 mcg inhaled QID    Sleep apnea Continue BiPAP at bedtime.    Class 2 obesity Lifestyle modifications. Follow-up with PCP.    Advance  Care Planning:   Code Status: Full Code   Consults: PCCM [facility], MD/Desai, Rahul P, PA-C).  Family Communication:  Severity of Illness: The appropriate patient status for this patient is OBSERVATION. Observation status is judged to be reasonable and necessary in order to provide the required intensity of service to ensure the patient's safety. The patient's presenting symptoms, physical exam findings, and initial radiographic and laboratory data in the context of their medical condition is felt to place them at decreased risk for further clinical deterioration. Furthermore, it is anticipated that the patient will be medically stable for discharge from the hospital within 2 midnights of admission.   Author: Reubin Milan, MD 03/14/2022 1:12 PM  For on call review [email or web address].   This document was prepared using Dragon voice recognition software and may contain some unintended transcription errors.

## 2022-03-14 NOTE — ED Triage Notes (Signed)
Pt BIB EMS from home, c/o SOB. Pt hx lupus, wears  3 liters O2NC at baseline. For the past 2 days, increased O2 demands at 10-15 liters at rest. Sats on EMS arrival 86% on 10 liters. Nonrebreather placed, O2 increased to 97%. Bilateral rales noted in lower lobes. A&O x4, takes torsemide daily.   BP 140/100 HR 140 20 LAC

## 2022-03-14 NOTE — ED Provider Notes (Signed)
[facility] DEPT Provider Note   CSN: [id number] Arrival date & time: 03/14/22  2355     History  Chief Complaint  Patient presents with   Shortness of Breath    Rhonda Franco is a 21 y.o. female.  With a history of systemic lupus involving the lungs, and interstitial lung disease who presents to the ED via EMS for evaluation of shortness of breath.  She is chronically on 3 L oxygen via nasal cannula while at rest and 10 L via nasal cannula with activity at home.  She states shortness of breath started approximately 2 weeks ago and has progressed.  She states that 2 to 3 days ago it got significantly worse and she required 15 L via nasal cannula while at rest.  Has chronic cough productive of clear sputum.  She also reports substernal chest pain for 2 weeks.  She describes this pain as a pressure and rates it as mild.  Also reports chronic headaches that are mild and unchanged at this time.  States her right foot may have slightly increased edema.  Denies sick contacts, vision or hearing changes, syncope, nausea, vomiting, diarrhea, abdominal pain, hemoptysis, recent changes to her medications.  She reports that she was seen approximately 1 month ago at [facility] emergency room and was treated with IV Lasix for pulmonary edema.   Shortness of Breath Associated symptoms: chest pain and cough   Associated symptoms: no diaphoresis and no fever        Home Medications Prior to Admission medications   Medication Sig Start Date End Date Taking? Authorizing Provider  aspirin EC 81 MG tablet Take 81 mg by mouth daily.    [provider]  atovaquone [employer]) 750 MG/5ML suspension Take by mouth. 03/08/21   [provider]  Cholecalciferol 50 MCG (2000 UT) TABS Take by mouth. 07/06/21   [provider]  Drospirenone (SLYND) 4 MG TABS Take 1 tablet by mouth daily. 04/27/20   Lajean Manes, CNM  famotidine (PEPCID) 20 MG tablet Take  20 mg by mouth 2 (two) times daily. 05/18/21   [provider]  fluticasone (FLONASE) 50 MCG/ACT nasal spray Place into both nostrils. 01/10/21   [provider]  furosemide (LASIX) 20 MG tablet Take 1 tablet (20 mg total) by mouth daily for 4 days. 10/16/21 10/20/21  Gareth Morgan, MD  gabapentin (NEURONTIN) 300 MG capsule Take 300 mg by mouth daily. Patient not taking: Reported on 10/11/2021 03/25/21   [provider]  hydroxychloroquine (PLAQUENIL) 200 MG tablet Take 1 tablet (200 mg total) by mouth 2 (two) times daily. 01/18/18   Upstill, Shari, PA-C  MITIGARE 0.6 MG CAPS Take 0.6 mg by mouth 2 (two) times daily. 09/04/18   [provider]  mycophenolate (CELLCEPT) 500 MG tablet Take 500 mg by mouth 2 (two) times daily. Patient not taking: Reported on 03/31/2021 05/18/19   [provider]  naproxen (NAPRELAN) 500 MG 24 hr tablet Take by mouth. 09/08/21 09/08/22  [provider]  OZEMPIC, 0.25 OR 0.5 MG/DOSE, 2 MG/1.5ML SOPN SMARTSIG:0.25 Milligram(s) SUB-Q Once a Week Patient not taking: Reported on 10/11/2021 02/24/21   [provider]  topiramate (TOPAMAX) 50 MG tablet Take by mouth. Patient not taking: Reported on 10/11/2021 12/19/20   [provider]      Allergies    Ampicillin, Cephalexin, Other, Penicillins, and Bactrim [sulfamethoxazole-trimethoprim]    Review of Systems   Review of Systems  Constitutional:  Negative for  chills, diaphoresis and fever.  Respiratory:  Positive for cough and shortness of breath.   Cardiovascular:  Positive for chest pain and leg swelling.    Physical Exam Updated Vital Signs BP (!) 121/92 (BP Location: Right Arm)   Pulse (!) 133   Temp 98.8 F (37.1 C) (Oral)   Resp (!) 26   Ht _0  (1.651 m)   Wt 98.4 kg   SpO2 96%   BMI 36.11 kg/m  Physical Exam Vitals and nursing note reviewed.  Constitutional:      General: She is in acute distress.     Appearance: She is well-developed. She  is not ill-appearing.  HENT:     Head: Normocephalic and atraumatic.  Eyes:     Conjunctiva/sclera: Conjunctivae normal.  Cardiovascular:     Rate and Rhythm: Regular rhythm. Tachycardia present.     Pulses: Normal pulses.     Heart sounds: Normal heart sounds. No murmur heard. Pulmonary:     Effort: Tachypnea and respiratory distress present.     Breath sounds: Examination of the right-upper field reveals rales. Examination of the right-middle field reveals rales. Examination of the left-middle field reveals rales. Examination of the right-lower field reveals rales. Examination of the left-lower field reveals rales. Rales present.  Abdominal:     Palpations: Abdomen is soft.     Tenderness: There is no abdominal tenderness.  Musculoskeletal:        General: No swelling.     Cervical back: Neck supple.     Right lower leg: No edema.     Left lower leg: No edema.  Skin:    General: Skin is warm and dry.     Capillary Refill: Capillary refill takes less than 2 seconds.  Neurological:     Mental Status: She is alert.  Psychiatric:        Mood and Affect: Mood normal.     ED Results / Procedures / Treatments   Labs (all labs ordered are listed, but only abnormal results are displayed) Labs Reviewed  RESP PANEL BY RT-PCR (FLU A&B, COVID) [id number]  BASIC METABOLIC PANEL  BRAIN NATRIURETIC PEPTIDE  CBC WITH DIFFERENTIAL/PLATELET  BLOOD GAS, VENOUS  LACTIC ACID, PLASMA  LACTIC ACID, PLASMA    EKG None  Radiology No results found.  Procedures Procedures    Medications Ordered in ED Medications - No data to display  ED Course/ Medical Decision Making/ A&P Clinical Course as of 03/14/22 1103  Wed Mar 14, 2022  0915 DG Chest 2 View I personally reviewed and interpreted the image.  Worsening airspace disease in both lungs, suspicious for pulmonary edema versus infectious process.  Prominent pulmonary artery consistent with pulmonary edema. [AS]  0942 CBC with  Differential(!) Leukocytosis of 21. 3 weeks ago when she presented to [facility] emergency department she had a leukocytosis of 25.3. [AS]  1102 Spoke with hospitalist Dr. Olevia Bowens who will admit patient to stepdown unit for pulmonary edema and possible pneumonia [AS]    Clinical Course User Index [AS] Phil Corti, [place] Folk, PA-C                           Medical Decision Making Amount and/or Complexity of Data Reviewed Labs: ordered. Radiology: ordered.  This patient presents to the ED for concern of shortness of breath., this involves an extensive number of treatment options, and is a complaint that carries with it a high risk of complications and morbidity.  The emergent differential diagnosis for shortness of breath includes, but is not limited to, Pulmonary edema, bronchoconstriction, Pneumonia, Pulmonary embolism, Pneumotherax/ Hemothorax, Dysrythmia, ACS.     Co morbidities that complicate the patient evaluation  Systemic lupus erythematosus, interstitial lung disease  Additional history obtained from: Nursing notes from this visit. Previous records within EMR system [facility] on 02/18/2022 for pulmonary edema and possible pneumonia.  Echo on 02/12/2019 showing normal heart function and ejection fraction of 55 to 60% Family mother is present and provides a portion of the history  I ordered, reviewed and interpreted labs which include: CBC, BMP, VBG, respiratory panel, lactate, BNP.  CBC significant for white blood count of 21, patient is on a long course of steroids so it is hard to tell if this is infectious or secondary to steroid use.  BNP slightly elevated at 307.  All other labs within normal limits.   I ordered imaging studies including chest x-ray two-view I independently visualized and interpreted imaging which showed worsening airspace disease in both lungs, suspicious for pulmonary edema versus infectious process.  Prominent pulmonary artery consistent  with pulmonary edema I agree with the radiologist interpretation  Cardiac Monitoring:  The patient was maintained on a cardiac monitor.  I personally viewed and interpreted the cardiac monitored which showed an underlying rhythm of: Sinus tachycardia  Consultations Obtained:  I requested consultation with the hospitalist Dr. Olevia Bowens,  and discussed lab and imaging findings as well as pertinent plan - they recommend: Admission to stepdown unit for further management of pulmonary edema and respiratory distress.  Afebrile, patient presented in respiratory distress with tachypnea in the mid 30s and tachycardia in the 130s.  She was quickly started on BiPAP in addition to her initial work-up.  She was also given a dose of IV Lasix and Levaquin after review chest x-ray.  She reported moderate improvement in respiratory symptoms with BiPAP.  She did require a small dose of Ativan in order to better tolerate BiPAP.  At the time of admission, patient was still tachypneic and tachycardic and I believe she warrants admission for further management of her pulmonary edema versus pneumonia with respiratory distress.  Patient's case discussed with Dr. Vanita Panda who agrees with plan to admit for further treatment of pulmonary edema versus pneumonia  Note: Portions of this report may have been transcribed using voice recognition software. Every effort was made to ensure accuracy; however, inadvertent computerized transcription errors may still be present.          Final Clinical Impression(s) / ED Diagnoses Final diagnoses:  None    Rx / DC Orders ED Discharge Orders     None         Roylene Reason, Hershal Coria 03/14/22 1135    Carmin Muskrat, MD 03/14/22 [phone]

## 2022-03-14 NOTE — ED Notes (Signed)
BSC at bedside. Pt requires standby assist for safety

## 2022-03-14 NOTE — Progress Notes (Signed)
RT NOTE:  Pt transported to Stepdown via BiPAP.

## 2022-03-14 NOTE — Progress Notes (Signed)
The patient wanted a break from BIPAP to get ice chips. Patient was set up on Heated High Flow @ 40L and 100% , she is maintaining 99% saturation and tolerating well at this time with no distress. RT will continue to monitor

## 2022-03-15 ENCOUNTER — Inpatient Hospital Stay (HOSPITAL_COMMUNITY): Payer: Medicaid Other

## 2022-03-15 DIAGNOSIS — Z20822 Contact with and (suspected) exposure to covid-19: Secondary | ICD-10-CM | POA: Diagnosis present

## 2022-03-15 DIAGNOSIS — E669 Obesity, unspecified: Secondary | ICD-10-CM | POA: Diagnosis present

## 2022-03-15 DIAGNOSIS — I50812 Chronic right heart failure: Secondary | ICD-10-CM | POA: Diagnosis present

## 2022-03-15 DIAGNOSIS — R0602 Shortness of breath: Secondary | ICD-10-CM

## 2022-03-15 DIAGNOSIS — R739 Hyperglycemia, unspecified: Secondary | ICD-10-CM | POA: Diagnosis not present

## 2022-03-15 DIAGNOSIS — E876 Hypokalemia: Secondary | ICD-10-CM | POA: Diagnosis present

## 2022-03-15 DIAGNOSIS — I2729 Other secondary pulmonary hypertension: Secondary | ICD-10-CM | POA: Diagnosis present

## 2022-03-15 DIAGNOSIS — M3213 Lung involvement in systemic lupus erythematosus: Secondary | ICD-10-CM | POA: Diagnosis present

## 2022-03-15 DIAGNOSIS — J810 Acute pulmonary edema: Secondary | ICD-10-CM | POA: Diagnosis present

## 2022-03-15 DIAGNOSIS — R030 Elevated blood-pressure reading, without diagnosis of hypertension: Secondary | ICD-10-CM | POA: Diagnosis not present

## 2022-03-15 DIAGNOSIS — J849 Interstitial pulmonary disease, unspecified: Secondary | ICD-10-CM | POA: Diagnosis not present

## 2022-03-15 DIAGNOSIS — I2699 Other pulmonary embolism without acute cor pulmonale: Secondary | ICD-10-CM

## 2022-03-15 DIAGNOSIS — E8809 Other disorders of plasma-protein metabolism, not elsewhere classified: Secondary | ICD-10-CM

## 2022-03-15 DIAGNOSIS — Z6836 Body mass index (BMI) 36.0-36.9, adult: Secondary | ICD-10-CM | POA: Diagnosis not present

## 2022-03-15 DIAGNOSIS — I2781 Cor pulmonale (chronic): Secondary | ICD-10-CM | POA: Diagnosis present

## 2022-03-15 DIAGNOSIS — R7303 Prediabetes: Secondary | ICD-10-CM | POA: Diagnosis present

## 2022-03-15 DIAGNOSIS — D638 Anemia in other chronic diseases classified elsewhere: Secondary | ICD-10-CM | POA: Diagnosis present

## 2022-03-15 DIAGNOSIS — Z79899 Other long term (current) drug therapy: Secondary | ICD-10-CM | POA: Diagnosis not present

## 2022-03-15 DIAGNOSIS — J8410 Pulmonary fibrosis, unspecified: Secondary | ICD-10-CM | POA: Diagnosis present

## 2022-03-15 DIAGNOSIS — Z7982 Long term (current) use of aspirin: Secondary | ICD-10-CM | POA: Diagnosis not present

## 2022-03-15 DIAGNOSIS — F40240 Claustrophobia: Secondary | ICD-10-CM | POA: Diagnosis present

## 2022-03-15 DIAGNOSIS — G4730 Sleep apnea, unspecified: Secondary | ICD-10-CM

## 2022-03-15 DIAGNOSIS — J309 Allergic rhinitis, unspecified: Secondary | ICD-10-CM | POA: Diagnosis present

## 2022-03-15 DIAGNOSIS — I272 Pulmonary hypertension, unspecified: Secondary | ICD-10-CM | POA: Diagnosis not present

## 2022-03-15 DIAGNOSIS — Z7985 Long-term (current) use of injectable non-insulin antidiabetic drugs: Secondary | ICD-10-CM | POA: Diagnosis not present

## 2022-03-15 DIAGNOSIS — R001 Bradycardia, unspecified: Secondary | ICD-10-CM | POA: Diagnosis present

## 2022-03-15 DIAGNOSIS — J9621 Acute and chronic respiratory failure with hypoxia: Secondary | ICD-10-CM | POA: Diagnosis present

## 2022-03-15 DIAGNOSIS — K5900 Constipation, unspecified: Secondary | ICD-10-CM | POA: Diagnosis not present

## 2022-03-15 DIAGNOSIS — Z9981 Dependence on supplemental oxygen: Secondary | ICD-10-CM | POA: Diagnosis not present

## 2022-03-15 LAB — ECHOCARDIOGRAM COMPLETE
AR max vel: 2.99 cm2
AV Peak grad: 5.7 mmHg
Ao pk vel: 1.2 m/s
Height: 65 in
MV M vel: 4.5 m/s
MV Peak grad: 81 mmHg
S' Lateral: 3.1 cm
Weight: 3472 oz

## 2022-03-15 LAB — COMPREHENSIVE METABOLIC PANEL
ALT: 16 U/L (ref 0–44)
AST: 21 U/L (ref 15–41)
Albumin: 2.8 g/dL — ABNORMAL LOW (ref 3.5–5.0)
Alkaline Phosphatase: 60 U/L (ref 38–126)
Anion gap: 9 (ref 5–15)
BUN: 8 mg/dL (ref 6–20)
CO2: 35 mmol/L — ABNORMAL HIGH (ref 22–32)
Calcium: 9.1 mg/dL (ref 8.9–10.3)
Chloride: 99 mmol/L (ref 98–111)
Creatinine, Ser: 0.46 mg/dL (ref 0.44–1.00)
GFR, Estimated: >60 mL/min (ref >60)
Glucose, Bld: 115 mg/dL — ABNORMAL HIGH (ref 70–99)
Potassium: 4.1 mmol/L (ref 3.5–5.1)
Sodium: 143 mmol/L (ref 135–145)
Total Bilirubin: 0.5 mg/dL (ref 0.3–1.2)
Total Protein: 7.4 g/dL (ref 6.5–8.1)

## 2022-03-15 LAB — WET PREP, GENITAL
Clue Cells Wet Prep HPF POC: NONE SEEN
Sperm: NONE SEEN
Trich, Wet Prep: NONE SEEN
WBC, Wet Prep HPF POC: <10 (ref <10)
Yeast Wet Prep HPF POC: NONE SEEN

## 2022-03-15 LAB — GLUCOSE, CAPILLARY
Glucose-Capillary: 99 mg/dL (ref 70–99)
Glucose-Capillary: 95 mg/dL (ref 70–99)
Glucose-Capillary: 151 mg/dL — ABNORMAL HIGH (ref 70–99)
Glucose-Capillary: 117 mg/dL — ABNORMAL HIGH (ref 70–99)
Glucose-Capillary: 172 mg/dL — ABNORMAL HIGH (ref 70–99)

## 2022-03-15 LAB — CBC
HCT: 37.3 % (ref 36.0–46.0)
Hemoglobin: 11.1 g/dL — ABNORMAL LOW (ref 12.0–15.0)
MCH: 25.5 pg — ABNORMAL LOW (ref 26.0–34.0)
MCHC: 29.8 g/dL — ABNORMAL LOW (ref 30.0–36.0)
MCV: 85.6 fL (ref 80.0–100.0)
Platelets: 494 10*3/uL — ABNORMAL HIGH (ref 150–400)
RBC: 4.36 MIL/uL (ref 3.87–5.11)
RDW: 14.6 % (ref 11.5–15.5)
WBC: 24.4 10*3/uL — ABNORMAL HIGH (ref 4.0–10.5)
nRBC: 0 % (ref 0.0–0.2)

## 2022-03-15 LAB — HIV ANTIBODY (ROUTINE TESTING W REFLEX): HIV Screen 4th Generation wRfx: NONREACTIVE

## 2022-03-15 MED ORDER — FUROSEMIDE 10 MG/ML IJ SOLN
120.0000 mg | Freq: Once | INTRAVENOUS | Status: AC
  Administered 2022-03-15: 120 mg via INTRAVENOUS
  Filled 2022-03-15: qty 10

## 2022-03-15 MED ORDER — TREPROSTINIL 0.6 MG/ML IN SOLN
72.0000 ug | RESPIRATORY_TRACT | Status: AC
  Administered 2022-03-15 – 2022-03-22 (×33): 72 ug via RESPIRATORY_TRACT

## 2022-03-15 MED ORDER — CLOTRIMAZOLE 1 % VA CREA
1.0000 | TOPICAL_CREAM | Freq: Every day | VAGINAL | Status: DC
  Administered 2022-03-15: 1 via VAGINAL
  Filled 2022-03-15: qty 45

## 2022-03-15 MED ORDER — SODIUM CHLORIDE 0.9 % IV SOLN
500.0000 mg | Freq: Every day | INTRAVENOUS | Status: AC
  Administered 2022-03-15 – 2022-03-17 (×3): 500 mg via INTRAVENOUS
  Filled 2022-03-15 (×3): qty 4

## 2022-03-15 MED ORDER — OXYCODONE HCL 5 MG PO TABS
5.0000 mg | ORAL_TABLET | Freq: Four times a day (QID) | ORAL | Status: DC | PRN
  Administered 2022-03-15: 5 mg via ORAL
  Filled 2022-03-15 (×2): qty 1

## 2022-03-15 MED ORDER — LEVOFLOXACIN IN D5W 750 MG/150ML IV SOLN
750.0000 mg | INTRAVENOUS | Status: DC
  Administered 2022-03-15 – 2022-03-16 (×2): 750 mg via INTRAVENOUS
  Filled 2022-03-15 (×2): qty 150

## 2022-03-15 NOTE — Assessment & Plan Note (Signed)
BMI 36

## 2022-03-15 NOTE — Progress Notes (Signed)
NAME:  Rhonda Franco, MRN:  [id number], DOB:  02/05/2001, LOS: 0 ADMISSION DATE:  03/14/2022, CONSULTATION DATE:  03/14/22 REFERRING MD:  Olevia Bowens CHIEF COMPLAINT:  Dyspnea   History of Present Illness:  Rhonda Franco is a 21 y.o. female who has a PMH as outlined below including but not limited to [facility] and SLE related ILD (followed at [facility] and has had mention of transplant consideration; however, not at goal weight yet). She uses 3 L of oxygen at rest and up to 10 L with activity. She has been managed with Myfortic starting in May 2022 along with prednisone and was switched to Cytoxan. Recently, she was started on Tyvaso. Per her last notes from [facility] (dated 12/20/21), she is currently on Atovaquone Hydroxychloroquine, Mycophenolate, Prednisone.  She presented to [facility] ED 9/6 with worsening dyspnea. She apparently had her Prednisone decreased from 52m to 7.56mdaily 3 - 4 weeks ago. Since then she has had progressive dyspnea. [facility] and was given Lasix and a dose of abx.  In ED today, she was noted to have distress and hypoxia. She was placed on BiPAP which she initially declined but later agreed to. She received 4022masix and has yet to have any UOP. CXR showed bilateral ASD concerning for edema vs infection along with chronic interstitial changes with honeycombing.  She continued to have respiratory rates in the 40's and 50's; hence, PCCM called in consultation.  She is currently anxious but is tolerating BiPAP ok. She does endorse anxiety and claustrophobia and she did receive 0.5mg32mivan earlier to assist with comfort of anxious and claustrophobia.  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  Significant Hospital Events: Including procedures, antibiotic start and stop dates in addition to other pertinent events   9/6 admit.  Interim History / Subjective:  Had rest from BiPAP yesterday around 5pm, tolerated HHFNC up until around 10pm then placed back on BiPAP for sleep. This morning she remains on BIPAP but does subjectively feel better. Mother at bedside. Received 120mg34mix yesterday evening with great response of 1.3L UOP. Repeating dose this AM and also increased steroids given that [facility]. CRP also up to 13.  Objective:  Blood pressure 107/69, pulse (!) 113, temperature 98.6 F (37 C), temperature source Axillary, resp. rate 20, height _0  (1.651 m), weight 98.4 kg, SpO2 100 %.    FiO2 (%):  [100 %] 100 %   Intake/Output Summary (Last 24 hours) at 03/15/2022 0749 Last data filed at 03/15/2022 0500 Gross per 24 hour  Intake 237.8 ml  Output 1350 ml  Net -1112.2 ml   Filed Weights   03/14/22 0834  Weight: 98.4 kg    Examination: General: Young adult female, resting in bed, in NAD. Neuro: A&O x 3, no deficits. HEENT: [place]/AT. Sclerae anicteric. EOMI. BiPAP in place. Cardiovascular: Tachy, regular, no M/R/G.  Lungs: Respirations rapid and labored.   CTA bilaterally, No W/R/R. Abdomen: Obese. BS x 4, soft, NT/ND.  Musculoskeletal: No gross deformities, no edema.  Skin: Intact, warm, no rashes.   Assessment & Plan:   Acute on chronic hypoxic respiratory failure - secondary to baseline pulmonary hypertension and ILD in the setting of SLE. - Continue HHFNC to maintain O2 saturations greater than 90%, wean to regular [place] if able. - BiPAP PRN and overnight for now.  - Low dose Ativan PRN if needed for anxiolysis and to allow for BiPAP comfort. - Increase Solumedrol to 580m daily x 3 days then wean and eventually get her back to her previous maintenance dose of 167mPrednisone daily. - Continue home Atovaquone, Hydroxychloroquine. - Maintain net negative balance, 1 more dose Lasix ordered by Dr. HuSilas Floododay then probably stop as will have likely dried her out. - Routine bronchial hygiene. - Follow CXR. - Hold home Mycophenolate for now. - [facility] induced [facility]. - SSI.   Rest per primary team.  Best practice (evaluated daily):  Per primary team.  Critical care time: N/A.   RaMontey HoraPAClark Millsulmonary & Critical Care Medicine For pager details, please see AMION or use Epic chat  After 1900, please call [facility]Christ[facility]or cross coverage needs 03/15/2022, 7:49 AM

## 2022-03-15 NOTE — Assessment & Plan Note (Addendum)
Presented with dyspnea, worsening hypoxia, and new infiltrates on chest imaging.  Suspect ILD flare (in setting of MMF cessation, tapering steroids, elevated CRP).  Did not improve with antibiotics (doubt typical infection), trial milrinone unsuccessful (doubt this is a worsening of cardiopulmonary hemodynamics).  Completed 3 days of Solu-Medrol 500 mg daily, 5 days antibiotics (levaquin --> Azactam/azithro).    Not stable for bronchoscopy to evaluate for atypical infections, but improving on steroids.  Down to 15L today (at home on 3-4 at rest, 10L with exertion) - Continue Solu-Medrol at 60 mg daily, plan for slow taper - Continue Tyvaso - Consult pulmonology, appreciate expertise

## 2022-03-15 NOTE — Assessment & Plan Note (Addendum)
Stopped Myfortic 2 months ago.  Is tapering down from prednisone, just prior to onset of symptoms, had gone from 10 mg daily to 7.5.  G6PD normal. - Continue hydroxychloroquine - Continue steroids - Continue daily aspirin - Continue atovaquone

## 2022-03-15 NOTE — Progress Notes (Signed)
  Progress Note   Patient: Rhonda Franco QIO:[id number] DOB: 2000-12-30 DOA: 03/14/2022     0 DOS: the patient was seen and examined on 03/15/2022 at 8:52 AM      Brief hospital course: Mrs. Tschida is a 21 y.o. F with SLE, SLE-related ILD, chronic respiratory failure on 2-3L at home, pHTN, hx RHF, OSA and obesity and anemia who presented with 2 days progrssive dyspnea in the setting of recently starting Tyvaso and titrating down prednisone from 10 to 7.5, now requiring >10L O2.  In the ER, HR 133, BP normal, SpO2 96% on 15L, WBC 21K, COVID negative, CXR with bilateral opacities.     9/6: Admitted on Lasix, IV steroids and BiPAP, pulm consulted     Assessment and Plan: * Acute on chronic respiratory failure with hypoxia (HCC) New infiltrates on chest imaging imply a ddx such as worsening of ILD (in setting of MMF cessation, tapering steroids, elevated CRP), left sided CHF, infection.  - Continue steroids, 500 mg solu-medrol daily for 3 days - Famotidine and CBGs while on high dose steroids - Continue Lasix 120 mg IV BID - Daily BMP, strict I/Os - Continue antibiotics Levaquin - Defer infectious ddx (fungal?) to Pulmonology - Consult Pulmonlogy, appreciate expertise     Hypoalbuminemia Complicates diuresis  Obesity (BMI 30-39.9) BMI 36  Sleep apnea - Continue BiPAP at night  Pulmonary hypertension (HCC) Due to pulmonary disease - Continue Tyvaso  Systemic lupus erythematosus with lung involvement ([place]) Stop Myfortic 2 months ago.  Is tapering down from prednisone, just prior to onset of symptoms, had gone from 10 mg daily to 7.5 - Continue hydroxychloroquine - Continue steroids - Continue daily aspirin - Patient refused atovaquone this morning, will obtain G6PD per request from her Rheumatologist and give inhaled pentamadine          Subjective: Patient is feeling less tight in her chest, slightly improved.  She has had no fever, not bringing up sputum,  no other focal pains.     Physical Exam: BP 102/68 (BP Location: Right Arm)   Pulse (!) 114   Temp 98.6 F (37 C) (Axillary)   Resp (!) 25   Ht _0  (1.651 m)   Wt 98.4 kg   SpO2 92%   BMI 36.11 kg/m   Obese adult female, lying in bed, appears uncomfortable, interactive and appropriate Tachycardic, regular, JVP not visible due to body habitus, mild nonpitting lower extremity edema, no pitting at all Respiratory rate increased, on high flow nasal cannula at 30 L/min, lung sounds actually fairly clear, do not appreciate rales in any quadrants, perhaps some wheezing in the right upper quadrant Abdomen soft without tenderness palpation or guarding, no ascites or distention Attention normal, affect tired but appropriate, judgment and insight appear normal, moves upper extremities with generalized weakness but symmetric strength, speech fluent   Data Reviewed: Discussed with pulmonology VBG shows low PO2 Chest x-ray shows reticular infiltrates Hemogram shows white blood cell count up to 24 A1c 5.7 COVID-negative CRP almost 14 BNP 3 of 7, lower than recent admission CMP normal except for hypoalbuminemia  Family Communication: Mother at the bedside    Disposition: Status is: Inpatient         Author: Edwin Dada, MD 03/15/2022 9:31 AM  For on call review [email or web address].

## 2022-03-15 NOTE — Assessment & Plan Note (Signed)
Complicates diuresis

## 2022-03-15 NOTE — Hospital Course (Addendum)
Rhonda Franco is a 21 y.o. F with SLE, SLE-related ILD, chronic respiratory failure on 2-3L at home, pHTN, hx RHF, OSA and obesity and anemia who presented with 2 days progrssive dyspnea in the setting of recently starting Tyvaso and titrating down prednisone from 10 to 7.5, now requiring >10L O2.  In the ER, HR 133, BP normal, SpO2 96% on 15L, WBC 21K, COVID negative, CXR with bilateral opacities.     9/6: Admitted on Lasix, IV steroids and BiPAP, pulm consulted 9/8: Switched to Azactam, PICC placed, milrinone started 9/10: No change; milrinone stopped

## 2022-03-15 NOTE — Assessment & Plan Note (Signed)
-  Continue BiPAP at night

## 2022-03-15 NOTE — Assessment & Plan Note (Addendum)
Types 1 and 3 PAH.  See above. - Continue Tyvaso

## 2022-03-15 NOTE — Progress Notes (Signed)
CROSS COVER NOTE  NAME: Rhonda Franco MRN: [id number] DOB : 2001/03/20    Date of Service   03/15/2022   HPI/Events of Note   [name] is reporting Vaginal discomfort after Levaquin administration this morning.  Interventions   Plan: Wet Prep, self collect Clotrimazole vaginal cream     This document was prepared using Dragon voice recognition software and may include unintentional dictation errors.  Neomia Glass DNP, MHA, FNP-BC Nurse Practitioner [facility] Pager [phone]

## 2022-03-16 ENCOUNTER — Inpatient Hospital Stay (HOSPITAL_COMMUNITY): Payer: Medicaid Other

## 2022-03-16 ENCOUNTER — Inpatient Hospital Stay: Payer: Self-pay

## 2022-03-16 DIAGNOSIS — I272 Pulmonary hypertension, unspecified: Secondary | ICD-10-CM | POA: Diagnosis not present

## 2022-03-16 DIAGNOSIS — N949 Unspecified condition associated with female genital organs and menstrual cycle: Secondary | ICD-10-CM

## 2022-03-16 DIAGNOSIS — E669 Obesity, unspecified: Secondary | ICD-10-CM | POA: Diagnosis not present

## 2022-03-16 DIAGNOSIS — E8809 Other disorders of plasma-protein metabolism, not elsewhere classified: Secondary | ICD-10-CM | POA: Diagnosis not present

## 2022-03-16 DIAGNOSIS — J9621 Acute and chronic respiratory failure with hypoxia: Secondary | ICD-10-CM | POA: Diagnosis not present

## 2022-03-16 LAB — BASIC METABOLIC PANEL
Anion gap: 8 (ref 5–15)
BUN: 13 mg/dL (ref 6–20)
CO2: 39 mmol/L — ABNORMAL HIGH (ref 22–32)
Calcium: 8.9 mg/dL (ref 8.9–10.3)
Chloride: 91 mmol/L — ABNORMAL LOW (ref 98–111)
Creatinine, Ser: 0.5 mg/dL (ref 0.44–1.00)
GFR, Estimated: >60 mL/min (ref >60)
Glucose, Bld: 114 mg/dL — ABNORMAL HIGH (ref 70–99)
Potassium: 4.1 mmol/L (ref 3.5–5.1)
Sodium: 138 mmol/L (ref 135–145)

## 2022-03-16 LAB — GLUCOSE, CAPILLARY
Glucose-Capillary: 112 mg/dL — ABNORMAL HIGH (ref 70–99)
Glucose-Capillary: 116 mg/dL — ABNORMAL HIGH (ref 70–99)
Glucose-Capillary: 128 mg/dL — ABNORMAL HIGH (ref 70–99)
Glucose-Capillary: 135 mg/dL — ABNORMAL HIGH (ref 70–99)
Glucose-Capillary: 153 mg/dL — ABNORMAL HIGH (ref 70–99)
Glucose-Capillary: 225 mg/dL — ABNORMAL HIGH (ref 70–99)
Glucose-Capillary: 89 mg/dL (ref 70–99)

## 2022-03-16 LAB — RESPIRATORY PANEL BY PCR

## 2022-03-16 LAB — CBC
HCT: 34.4 % — ABNORMAL LOW (ref 36.0–46.0)
Hemoglobin: 10.4 g/dL — ABNORMAL LOW (ref 12.0–15.0)
MCH: 25.7 pg — ABNORMAL LOW (ref 26.0–34.0)
MCHC: 30.2 g/dL (ref 30.0–36.0)
MCV: 85.1 fL (ref 80.0–100.0)
Platelets: 498 10*3/uL — ABNORMAL HIGH (ref 150–400)
RBC: 4.04 MIL/uL (ref 3.87–5.11)
RDW: 14.5 % (ref 11.5–15.5)
WBC: 20.3 10*3/uL — ABNORMAL HIGH (ref 4.0–10.5)
nRBC: 0.1 % (ref 0.0–0.2)

## 2022-03-16 LAB — PHOSPHORUS: Phosphorus: 3.7 mg/dL (ref 2.5–4.6)

## 2022-03-16 LAB — COOXEMETRY PANEL
Carboxyhemoglobin: 1.3 % (ref 0.5–1.5)
Methemoglobin: 1.7 % — ABNORMAL HIGH (ref 0.0–1.5)
O2 Saturation: 76.5 %
Total hemoglobin: 10.9 g/dL — ABNORMAL LOW (ref 12.0–16.0)

## 2022-03-16 LAB — MAGNESIUM: Magnesium: 1.9 mg/dL (ref 1.7–2.4)

## 2022-03-16 LAB — BRAIN NATRIURETIC PEPTIDE: B Natriuretic Peptide: 61.1 pg/mL (ref 0.0–100.0)

## 2022-03-16 LAB — TROPONIN I (HIGH SENSITIVITY): Troponin I (High Sensitivity): 2 ng/L (ref <18)

## 2022-03-16 LAB — LACTIC ACID, PLASMA: Lactic Acid, Venous: 1.3 mmol/L (ref 0.5–1.9)

## 2022-03-16 LAB — PROCALCITONIN: Procalcitonin: 0.38 ng/mL

## 2022-03-16 MED ORDER — SODIUM CHLORIDE 0.9 % IV SOLN: INTRAVENOUS | Status: DC | PRN

## 2022-03-16 MED ORDER — SODIUM CHLORIDE 0.9 % IV SOLN
2.0000 g | Freq: Three times a day (TID) | INTRAVENOUS | Status: DC
  Administered 2022-03-17 – 2022-03-19 (×8): 2 g via INTRAVENOUS
  Filled 2022-03-16 (×10): qty 10

## 2022-03-16 MED ORDER — MAGNESIUM SULFATE 2 GM/50ML IV SOLN
2.0000 g | Freq: Once | INTRAVENOUS | Status: AC
  Administered 2022-03-16: 2 g via INTRAVENOUS
  Filled 2022-03-16: qty 50

## 2022-03-16 MED ORDER — SODIUM CHLORIDE 0.9 % IV SOLN
500.0000 mg | INTRAVENOUS | Status: AC
  Administered 2022-03-17 – 2022-03-18 (×2): 500 mg via INTRAVENOUS
  Filled 2022-03-16 (×2): qty 5

## 2022-03-16 MED ORDER — SODIUM CHLORIDE 0.9% FLUSH: 10.0000 mL | INTRAVENOUS | Status: DC | PRN

## 2022-03-16 MED ORDER — SODIUM CHLORIDE 0.9% FLUSH
10.0000 mL | Freq: Two times a day (BID) | INTRAVENOUS | Status: DC
  Administered 2022-03-16 – 2022-03-23 (×13): 10 mL
  Administered 2022-03-23: 20 mL
  Administered 2022-03-24 – 2022-03-26 (×5): 10 mL

## 2022-03-16 MED ORDER — MILRINONE LACTATE IN DEXTROSE 20-5 MG/100ML-% IV SOLN
0.2500 ug/kg/min | INTRAVENOUS | Status: DC
  Administered 2022-03-16: 0.125 ug/kg/min via INTRAVENOUS
  Administered 2022-03-17: 0.25 ug/kg/min via INTRAVENOUS
  Filled 2022-03-16 (×2): qty 100

## 2022-03-16 NOTE — Progress Notes (Signed)
Progress Note   Patient: Rhonda Franco ALP:[id number] DOB: [birth year] DOA: 03/14/2022     1 DOS: the patient was seen and examined on 03/16/2022 at 9:20AM      Brief hospital course: Mrs. Duggin is a 21 y.o. F with SLE, SLE-related ILD, chronic respiratory failure on 2-3L at home, pHTN, hx RHF, OSA and obesity and anemia who presented with 2 days progrssive dyspnea in the setting of recently starting Tyvaso and titrating down prednisone from 10 to 7.5, now requiring >10L O2.  In the ER, HR 133, BP normal, SpO2 96% on 15L, WBC 21K, COVID negative, CXR with bilateral opacities.     9/6: Admitted on Lasix, IV steroids and BiPAP, pulm consulted     Assessment and Plan: * Acute on chronic respiratory failure with hypoxia (HCC) New infiltrates on chest imaging imply a ddx such as worsening of ILD (in setting of MMF cessation, tapering steroids, elevated CRP), infection.  Net negative 1L daily on high dose IV lasix, Bicarb up to 39 today and CXR personally reviewed and unchanged.    We are weaning down slowly on steroids. - Continue steroids, 500 mg solu-medrol day 2 of 3 - Famotidine and CBGs while on high dose steroids - Stop Lasix - Continue antibiotics Levaquin day 3 of 5 - Defer infectious ddx (fungal?) to Pulmonology - Consult Pulmonlogy, appreciate expertise     Vaginal discomfort New vaginal discomfort after starting Levaquin.  Wet prep normal.  Hypoalbuminemia Complicates diuresis  Obesity (BMI 30-39.9) BMI 36  Sleep apnea - Continue BiPAP at night  Pulmonary hypertension (HCC) Due to pulmonary disease - Continue Tyvaso  Systemic lupus erythematosus with lung involvement (HCC) Stopped Myfortic 2 months ago.  Is tapering down from prednisone, just prior to onset of symptoms, had gone from 10 mg daily to 7.5 - Continue hydroxychloroquine - Continue steroids - Continue daily aspirin - Follow G6PD - Continue atovaquone          Subjective: Patient  anxious and sad about her condition.  Desaturated down to the 60s with ambulation today.  Relatively slow to improve.  No significant dyspnea, still has chest tightness.     Physical Exam: BP (!) 153/101   Pulse (!) 103   Temp 97.6 F (36.4 C) (Axillary)   Resp 17   Ht _0  (1.651 m)   Wt 99.5 kg   SpO2 92%   BMI 36.50 kg/m   Adult female, lying in bed, no acute distress, but appears sad and tired Tachycardic, regular, no murmurs, no pitting peripheral edema Respiratory rate seems normal, on high flow nasal cannula, lung sounds diminished on the left, normal on the right, fairly good air movement, no wheezing Abdomen soft without tenderness palpation or guarding, no ascites or distention Attention normal, affect normal, judgment insight appear normal   Data Reviewed: Discussed with pulmonology Outside records from [place] reviewed, she has been evaluated for pulmonary transplant, but is not a candidate at present due to weight.  She follows with the pulmonary hypertension specialist, and rheumatology.  Recent sleep switch to Tyvaso, has been doing relatively well with this, stopped Myfortic, and was tapering prednisone. Basic metabolic panel and magnesium normal Bicarb rising Hemoglobin 10, no change Labs unremarkable Echocardiogram shows increased RV pressures, EF normal, no evidence of valve disease Chest x-ray from today, personally reviewed, shows no change  Family Communication: Mother at the bedside    Disposition: Status is: Inpatient         Author: Harrell Gave P  Coby Shrewsberry, MD 03/16/2022 10:13 AM  For on call review [email or web address].

## 2022-03-16 NOTE — Progress Notes (Signed)
eLink Physician-Brief Progress Note Patient Name: Rhonda Franco DOB: 2001/06/05 MRN: [id number]   Date of Service  03/16/2022  HPI/Events of Note  Milrinone started at 0.125 mcg/kg78mn at 8:30 PM. BP has been stable according to nursing.   eICU Interventions  Will increase Milrinone to 0.25 mcg/kg/min.      Intervention Category Major Interventions: Other:  SLysle Dingwall9/02/2022, 11:25 PM

## 2022-03-16 NOTE — Assessment & Plan Note (Addendum)
New vaginal discomfort after starting Levaquin.  Wet prep normal.

## 2022-03-16 NOTE — Progress Notes (Signed)
Peripherally Inserted Central Catheter Placement  The IV Nurse has discussed with the patient and/or persons authorized to consent for the patient, the purpose of this procedure and the potential benefits and risks involved with this procedure.  The benefits include less needle sticks, lab draws from the catheter, and the patient may be discharged home with the catheter. Risks include, but not limited to, infection, bleeding, blood clot (thrombus formation), and puncture of an artery; nerve damage and irregular heartbeat and possibility to perform a PICC exchange if needed/ordered by physician.  Alternatives to this procedure were also discussed.  Bard Power PICC patient education guide, fact sheet on infection prevention and patient information card has been provided to patient /or left at bedside.    PICC Placement Documentation  PICC Double Lumen 20/72/18 Right Basilic 45 cm 0 cm (Active)  Indication for Insertion or Continuance of Line Prolonged intravenous therapies 03/16/22 1917  Exposed Catheter (cm) 0 cm 03/16/22 1917  Site Assessment Clean, Dry, Intact 03/16/22 1917  Lumen #1 Status Flushed;Saline locked;Blood return noted 03/16/22 1917  Lumen #2 Status Flushed;Saline locked;Blood return noted 03/16/22 1917  Dressing Type Transparent;Securing device 03/16/22 1917  Dressing Status Antimicrobial disc in place 03/16/22 [place] Not Applicable 28/83/37 4451  Line Care Connections checked and tightened 03/16/22 1917  Line Adjustment (NICU/IV Team Only) No 03/16/22 1917  Dressing Intervention New dressing 03/16/22 1917  Dressing Change Due 03/23/22 03/16/22 [place] 03/16/2022, 7:19 PM

## 2022-03-16 NOTE — Progress Notes (Signed)
Spiritual Care Note  Provided pastoral presence and spiritual/emotional support to Arionna's mom Mieka Leaton while she was waiting outside the room for a sterile procedure to take place. Also provided prayer by request and ensured that Marcie Bal is aware of ongoing Spiritual Care availability, should Blondina or family desire follow-up support.  [place], MDiv, [facility] 24/7 pager [phone]

## 2022-03-16 NOTE — Plan of Care (Signed)

## 2022-03-16 NOTE — IPAL (Signed)
     Interdisciplinary Goals of Care Family Meeting   Date carried out: 03/16/2022  Location of the meeting: Bedside  Member's involved: Physician, Bedside Registered Nurse, Family Member or next of kin, and Other: MOM  Durable Power of Tour manager: MOM    Discussion: We discussed goals of care for Rhonda Franco .  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 REcent flare up with Virus in April 2023 and since then increased fio2 needed. Currently in worsening acute on chronic resp fialure and needing HFNC with qhs bipap and day time 2 application. ECHO with RV strain and IVS flatterning. BNP is normal. D/w Dr Jerilee Field who is worried is all lung related but feels worth considering a trial of milrinone. Wanted to get HRCT but not stable for transfer. D/w mom about PICC line and she is in agreement  D/w Mom that overall prognosis of ILD with baseline honeycombing in resp flare up is very ppor esp if unable to reverse quickly in ICU setting. Explained of above. Explained that CPR will not be benefitical but can try short term intubation  She was in tears   She wanted to reflect on code status   Code status: Full Code  Disposition: Continue current acute care  Time spent for the meeting and care: 45 min ccm       SIGNATURE    Dr. Brand Males, M.D., F.C.C.P,  Pulmonary and Critical Care Medicine Staff Physician, [place] Director - Interstitial Lung Disease  Program  Medical Director - [place] [facility] [place] at [place], [place], [address]  NPI Number:  NPI #[id number] [place] Number: [id number]  Pager: [phone], If no answer  -> Check AMION or Try [phone] Telephone (clinical office): [phone] Telephone (research): [phone]  6:09 PM 03/16/2022

## 2022-03-16 NOTE — Progress Notes (Signed)
Patient wants to eat lunch before going on Bipap.  RT will wait until after she has eaten before placing back on Bipap.

## 2022-03-16 NOTE — Progress Notes (Signed)
NAME:  Rhonda Franco, MRN:  [id number], DOB:  [birth year], LOS: 1 ADMISSION DATE:  03/14/2022, CONSULTATION DATE:  03/14/22 REFERRING MD:  Olevia Bowens CHIEF COMPLAINT:  Dyspnea   History of Present Illness:  Rhonda Franco is a 21 y.o. female who has a PMH as outlined below including but not limited to [facility] and SLE related ILD (followed at [facility] and has had mention of transplant consideration; however, not at goal weight yet). She uses 3 L of oxygen at rest and up to 10 L with activity. She has been managed with Myfortic starting in May 2022 along with prednisone and was switched to Cytoxan. Recently, she was started on Tyvaso. Per her last notes from [facility] (dated 12/20/21), she is currently on Atovaquone Hydroxychloroquine, Mycophenolate, Prednisone.  She presented [facility] ED 9/6 with worsening dyspnea. She apparently had her Prednisone decreased from 47m to 7.541mdaily 3 - 4 weeks ago. Since then she has had progressive dyspnea. [facility] and was given Lasix and a dose of abx.  In ED today, she was noted to have distress and hypoxia. She was placed on BiPAP which she initially declined but later agreed to. She received 409masix and has yet to have any UOP. CXR showed bilateral ASD concerning for edema vs infection along with chronic interstitial changes with honeycombing.  She continued to have respiratory rates in the 40's and 50's; hence, PCCM called in consultation.  She is currently anxious but is tolerating BiPAP ok. She does endorse anxiety and claustrophobia and she did receive 0.5mg6mivan earlier to assist with comfort of anxious and claustrophobia.  Pertinent  Medical History:  has Pericardial effusion; Family history of diabetes mellitus; Allergic rhinitis; Acanthosis nigricans; BMI 40.0-44.9, adult (HCC)[place]; High risk heterosexual behavior; Class 3 severe obesity due to excess calories without serious comorbidity with body mass index (BMI) of  40.0 to 44.9 in adult ([facility] completed; Elevated blood pressure reading without diagnosis of hypertension; Interstitial lung disease due to connective tissue disease (HCC)[place] lupus erythematosus with lung involvement (HCC)[place] lupus erythematosus, unspecified (HCC)[place] on chronic respiratory failure with hypoxia (HCC)[place] chest pain; Acute right ventricular heart failure (HCC)[place] hypertension (HCC)[place]ed blood cell antibody positive; Sleep apnea; Keloid scar; High risk social situation; Dysplasia of cervix, low grade (CIN 1); Myositis; [place] fibroma; Obesity (BMI 30-39.9); Hypoalbuminemia; and Vaginal discomfort on their problem list.  Significant Hospital Events: Including procedures, antibiotic start and stop dates in addition to other pertinent events   9/6 admit.  Interim History / Subjective:  Remained on BiPAP overnight. [facility] but des[facility] during Tyvaso treatment. 1300 UOP overnight, net 2.2L. Feels about the same.  Objective:  Blood pressure (!) 153/101, pulse (!) 103, temperature 97.6 F (36.4 C), temperature source Axillary, resp. rate 17, height _0  (1.651 m), weight 99.5 kg, SpO2 92 %.    FiO2 (%):  [60 %-85 %] 75 %   Intake/Output Summary (Last 24 hours) at 03/16/2022 0916 Last data filed at 03/16/2022 0442 Gross per 24 hour  Intake 208 ml  Output 1325 ml  Net -1117 ml    Filed Weights   03/14/22 0834 03/16/22 0442  Weight: 98.4 kg 99.5 kg    Examination: General: Young adult female, resting in bed, in NAD. Neuro: A&O x 3, no deficits. HEENT: [place]/AT. Sclerae anicteric. EOMI. BiPAP in place. Cardiovascular: Tachy, regular, no M/R/G.  Lungs: Respirations rapid and  labored.  CTA bilaterally, No W/R/R. Abdomen: Obese. BS x 4, soft, NT/ND.  Musculoskeletal: No gross deformities, no edema.  Skin: Intact, warm, no rashes.   Assessment & Plan:   Acute on chronic hypoxic respiratory failure -  secondary to baseline pulmonary hypertension and ILD in the setting of SLE. - Continue HHFNC to maintain O2 saturations greater than 90%, wean as able. - BiPAP PRN and overnight for now.  - Low dose Ativan PRN if needed for anxiolysis and to allow for BiPAP comfort. - Continue Solumedrol 554m daily x 3 days (today is day 2/3) then wean and eventually get her back to her previous maintenance dose of 163mPrednisone daily. - Continue home Atovaquone, Hydroxychloroquine. - Maintain net negative balance, hold further lasix today. - Needs to get out of bed, have asked PT to assist with bed to chair at least. - Routine bronchial hygiene. - Follow CXR. - Hold home Mycophenolate for now. - [facility]. - SSI.   Rest per primary team. [facility].  Best practice (evaluated daily):  Per primary team.  Critical care time: N/A.   RaMontey HoraPASalinaulmonary & Critical Care Medicine For pager details, please see AMION or use Epic chat  After 1900, please call ELClement [facility] Centeror cross coverage needs 03/16/2022, 9:16 AM

## 2022-03-17 DIAGNOSIS — J9621 Acute and chronic respiratory failure with hypoxia: Secondary | ICD-10-CM | POA: Diagnosis not present

## 2022-03-17 DIAGNOSIS — E669 Obesity, unspecified: Secondary | ICD-10-CM | POA: Diagnosis not present

## 2022-03-17 DIAGNOSIS — I272 Pulmonary hypertension, unspecified: Secondary | ICD-10-CM | POA: Diagnosis not present

## 2022-03-17 DIAGNOSIS — E8809 Other disorders of plasma-protein metabolism, not elsewhere classified: Secondary | ICD-10-CM | POA: Diagnosis not present

## 2022-03-17 LAB — GLUCOSE, CAPILLARY
Glucose-Capillary: 105 mg/dL — ABNORMAL HIGH (ref 70–99)
Glucose-Capillary: 104 mg/dL — ABNORMAL HIGH (ref 70–99)
Glucose-Capillary: 176 mg/dL — ABNORMAL HIGH (ref 70–99)
Glucose-Capillary: 108 mg/dL — ABNORMAL HIGH (ref 70–99)
Glucose-Capillary: 126 mg/dL — ABNORMAL HIGH (ref 70–99)
Glucose-Capillary: 147 mg/dL — ABNORMAL HIGH (ref 70–99)

## 2022-03-17 LAB — BASIC METABOLIC PANEL
Anion gap: 7 (ref 5–15)
BUN: 14 mg/dL (ref 6–20)
CO2: 38 mmol/L — ABNORMAL HIGH (ref 22–32)
Calcium: 8.5 mg/dL — ABNORMAL LOW (ref 8.9–10.3)
Chloride: 92 mmol/L — ABNORMAL LOW (ref 98–111)
Creatinine, Ser: 0.42 mg/dL — ABNORMAL LOW (ref 0.44–1.00)
GFR, Estimated: >60 mL/min (ref >60)
Glucose, Bld: 122 mg/dL — ABNORMAL HIGH (ref 70–99)
Potassium: 3.5 mmol/L (ref 3.5–5.1)
Sodium: 137 mmol/L (ref 135–145)

## 2022-03-17 LAB — LACTATE DEHYDROGENASE: LDH: 160 U/L (ref 98–192)

## 2022-03-17 LAB — COOXEMETRY PANEL
Carboxyhemoglobin: 1.7 % — ABNORMAL HIGH (ref 0.5–1.5)
Carboxyhemoglobin: 2 % — ABNORMAL HIGH (ref 0.5–1.5)
Methemoglobin: 1 % (ref 0.0–1.5)
Methemoglobin: 0.8 % (ref 0.0–1.5)
O2 Saturation: 90.6 %
O2 Saturation: 78.6 %
Total hemoglobin: 10.2 g/dL — ABNORMAL LOW (ref 12.0–16.0)
Total hemoglobin: 9.8 g/dL — ABNORMAL LOW (ref 12.0–16.0)

## 2022-03-17 LAB — GLUCOSE 6 PHOSPHATE DEHYDROGENASE
G6PDH: 13.9 U/g{Hb} (ref 4.7–14.6)
Hemoglobin: 11.8 g/dL (ref 11.1–15.9)

## 2022-03-17 LAB — STREP PNEUMONIAE URINARY ANTIGEN: Strep Pneumo Urinary Antigen: NEGATIVE

## 2022-03-17 LAB — PROCALCITONIN: Procalcitonin: 0.26 ng/mL

## 2022-03-17 LAB — BRAIN NATRIURETIC PEPTIDE: B Natriuretic Peptide: 42.7 pg/mL (ref 0.0–100.0)

## 2022-03-17 MED ORDER — SENNOSIDES-DOCUSATE SODIUM 8.6-50 MG PO TABS: 1.0000 | ORAL_TABLET | Freq: Two times a day (BID) | ORAL | Status: DC | PRN

## 2022-03-17 MED ORDER — POLYETHYLENE GLYCOL 3350 17 G PO PACK
17.0000 g | PACK | Freq: Every day | ORAL | Status: DC
  Administered 2022-03-17 – 2022-03-23 (×3): 17 g via ORAL
  Filled 2022-03-17 (×8): qty 1

## 2022-03-17 MED ORDER — SALINE SPRAY 0.65 % NA SOLN
1.0000 | NASAL | Status: DC | PRN
  Filled 2022-03-17: qty 44

## 2022-03-17 NOTE — Progress Notes (Signed)
PT Cancellation Note  Patient Details Name: Phyllis Abelson MRN: [id number] DOB: 2001-04-22   Cancelled Treatment:    Reason Eval/Treat Not Completed: Medical issues which prohibited therapy (Per RN pt has hypoxia with minimal movement and is not medically ready for PT today. Will follow.)   Philomena Doheny PT 03/17/2022  Acute Rehabilitation Services  Office [phone]

## 2022-03-17 NOTE — Progress Notes (Signed)
NAME:  Rhonda Franco, MRN:  [id number], DOB:  [birth year], LOS: 2 ADMISSION DATE:  03/14/2022, CONSULTATION DATE:  03/14/22 REFERRING MD:  Olevia Bowens CHIEF COMPLAINT:  Dyspnea   History of Present Illness:  Rhonda Franco is a 21 y.o. female who has a PMH as outlined below including but not limited to [facility] and SLE related ILD (followed at [facility] and has had mention of transplant consideration; however, not at goal weight yet). She uses 3 L of oxygen at rest and up to 10 L with activity. She has been managed with Myfortic starting in May 2022 along with prednisone and was switched to Cytoxan. Recently, she was started on Tyvaso. Per her last notes from [facility] (dated 12/20/21), she is currently on Atovaquone Hydroxychloroquine, Mycophenolate, Prednisone.  She presented to [facility] ED 9/6 with worsening dyspnea. She apparently had her Prednisone decreased from 65m to 7.539mdaily 3 - 4 weeks ago. Since then she has had progressive dyspnea. [facility] and was given Lasix and a dose of abx.  In ED today, she was noted to have distress and hypoxia. She was placed on BiPAP which she initially declined but later agreed to. She received 4082masix and has yet to have any UOP. CXR showed bilateral ASD concerning for edema vs infection along with chronic interstitial changes with honeycombing.  She continued to have respiratory rates in the 40's and 50's; hence, PCCM called in consultation.  She is currently anxious but is tolerating BiPAP ok. She does endorse anxiety and claustrophobia and she did receive 0.5mg78mivan earlier to assist with comfort of anxious and claustrophobia.  Pertinent  Medical History:  has Pericardial effusion; Family history of diabetes mellitus; Allergic rhinitis; Acanthosis nigricans; BMI 40.0-44.9, adult (HCC)[place]; High risk heterosexual behavior; Class 3 severe obesity due to excess calories without serious comorbidity with body mass index (BMI) of  40.0 to 44.9 in adult ([facility] series completed; Elevated blood pressure reading without diagnosis of hypertension; Interstitial lung disease due to connective tissue disease (HCC)[place] lupus erythematosus with lung involvement (HCC)[place]ic lupus erythematosus, unspecified (HCC)[place] on chronic respiratory failure with hypoxia (HCC)[place] chest pain; Acute right ventricular heart failure (HCC)[place] hypertension (HCC)Milroyed blood cell antibody positive; Sleep apnea; Keloid scar; High risk social situation; Dysplasia of cervix, low grade (CIN 1); Myositis; [place] fibroma; Obesity (BMI 30-39.9); Hypoalbuminemia; and Vaginal discomfort on their problem list.   -> FVC 1.27L/38%, DLCO 7 - .o. female seen for SLE, a high-risk disease with multiple issues. SLE (2018) +ANA, +Sm, +RNP, +Ro, +dsDNA, ACA IgA 15, +coombs hemoyltic anemia, +lupus anticoagulant. pericardial effusion s/p window, ILD with fibrosis. systolic dysfunction (EF 50%)51%TX, Cellcept, Myfortic (2022-), Benlysta, HCQ. nexplanon. Cytoxan (12/202-11/2021). Rituxan 1000mg71mw 01/2202.    [facility]  Severe ILD with fibrosis and pulm hypertension followed by pulmonary. Has also had right sided heart failure. Now s/p Cytoxan, followed by recent Rituxan. Recently in OSH with increased SOB with increased edema and hypokalemia, diuretics given and fluid improved, also started on abx Levaquin. Will continue prednisone taper as this may be contributing. Once she completes antibitoics can resume Myfortic.     Significant Hospital Events: Including procedures, antibiotic start and stop dates in addition to other pertinent events   9/6 admit.  9.8 - Remained on BiPAP overnight. [place] but desaturated during Tyvaso treatment. 1300 UOP overnight, net 2.2L. Feels about the same.  -  IPAL: Full code  - started milirnone empiric  - PICC Placed  Interim History / Subjective:   9/9 - NSVTACh this  morning on 0.25 of milrinone and stopped. On 65% 25L HFNC. BiPAP Qhs with 2 application in day/ Afebrile/ Is -1.5L Negative. Coox 90 on milrinone. Unable to lie flat due to desats. Currently pulse ox 95%. Havin some nausea after food and is constipated . Mom and aunt at bedside. Levaquin changed to Azactaim  yesterday due to vaginal itch   Objective:  Blood pressure 118/64, pulse (!) 103, temperature 97.7 F (36.5 C), temperature source Oral, resp. rate (!) 26, height _0  (1.651 m), weight 99.5 kg, SpO2 100 %.    FiO2 (%):  [60 %-65 %] 65 %   Intake/Output Summary (Last 24 hours) at 03/17/2022 1038 Last data filed at 03/17/2022 0600 Gross per 24 hour  Intake 1163.53 ml  Output 455 ml  Net 708.53 ml   Filed Weights   03/14/22 0834 03/16/22 0442  Weight: 98.4 kg 99.5 kg    Examination: General Appearance:   Obese Head:  Normocephalic, without obvious abnormality, atraumatic Eyes:  PERRL - yes, conjunctiva/corneas - muddy     Ears:  Normal external ear canals, both ears Nose:  G tube - no Throat:  ETT TUBE - no , OG tube - no Neck:  Supple,  No enlargement/tenderness/nodules Lungs: Clear to auscultation bilaterally, RR 25-30. Not paradoxical. On HFNC now. Used BiPAP Heart:  S1 and S2 normal, no murmur, CVP - x.  Pressors - NO Abdomen:  Soft, no masses, no organomegaly Genitalia / Rectal:  Not done Extremities:  Extremities- intact Skin:  ntact in exposed areas . Sacral area - x Neurologic:  Sedation - none -> RASS - +1 . Moves all 4s - yes. CAM-ICU - neg . Orientation - x3+      Assessment & Plan:   Acute on chronic hypoxic respiratory failure - secondary to baseline pulmonary hypertension and ILD iwith  honeycombing n the setting of SLE.   03/17/2022 - unchanged severe hypoxemia. Did not tolerae empiric milrinone at 0.25 dose. Unable to get CT due to severe orthopnea and orthodeoxia  PLAN - Continue HHFNC to maintain O2 saturations greater than 90%, wean as able.  - avoid  being flat - BiPAP Qh sand 2 applications x 2h each in day - Continue Solumedrol 529m daily x 3 days (today is day 3/3) then wean and eventually get her back to her previous maintenance dose of 129mPrednisone daily. - Continue home Atovaquone, Hydroxychloroquine. - Continue tyvaso - check coox without milrinone -> depending on course might need RHC if safe -  Follow CXR. - Hold home Mycophenolate for now. - Intubate if worse  At risk for steroid induced hyperglycemia. - SSI.  Best practice (evaluated daily):  Per primary team.    Interdisciplinary Goals of Care Family Meeting   Date carried out: 03/17/2022  Location of the meeting: Bedside  Member's involved: Physician, Bedside Registered Nurse, Family Member or next of kin, and Other: RN MuPercell MillerMother and aunt  Durable Power of AtForensic psychologistr acLoss adjuster, charteredMom    Discussion: We discussed goals of care for [employer]  Current status  Code status: Full Code  Disposition: Continue current acute care - they are contemplating no CPR  Time spent for the meeting: 10 min      [place] The patient Analisia HaRoyses critically ill with multiple organ systems  failure and requires high complexity decision making for assessment and support, frequent evaluation and titration of therapies, application of advanced monitoring technologies and extensive interpretation of multiple databases.   Critical Care Time devoted to patient care services described in this note is  35  Minutes. This time reflects time of care of this signee Dr Brand Males. This critical care time does not reflect procedure time, or teaching time or supervisory time of PA/NP/Med student/Med Resident etc but could involve care discussion time     Dr. Brand Males, M.D., [facility].C.P Pulmonary and Critical Care Medicine Medical Director - [facility] ICU Staff Physician, [place] [facility]  and Critical Care Pager: [phone], If no answer or between  15:00h - 7:00h: call [phone]  03/17/2022 10:44 AM   LABS    PULMONARY Recent Labs  Lab 03/14/22 0900 03/16/22 1930 03/17/22 0538  HCO3 31.5*  --   --   O2SAT 88.2 76.5 90.6    CBC Recent Labs  Lab 03/14/22 0846 03/15/22 0328 03/16/22 0329  HGB 10.9* 11.1* 10.4*  HCT 37.2 37.3 34.4*  WBC 21.0* 24.4* 20.3*  PLT 495* 494* 498*    COAGULATION Recent Labs  Lab 03/14/22 1259  INR 1.3*    CARDIAC  No results for input(s): "TROPONINI" in the last 168 hours. No results for input(s): "PROBNP" in the last 168 hours.   CHEMISTRY Recent Labs  Lab 03/14/22 0846 03/15/22 0328 03/16/22 0329 03/17/22 0515  NA 141 143 138 137  K 3.5 4.1 4.1 3.5  CL 107 99 91* 92*  CO2 28 35* 39* 38*  GLUCOSE 106* 115* 114* 122*  BUN _0 CREATININE 0.48 0.46 0.50 0.42*  CALCIUM 8.5* 9.1 8.9 8.5*  MG 1.9  --  1.9  --   PHOS 3.7  --  3.7  --    Estimated Creatinine Clearance: 128.9 mL/min (A) (by C-G formula based on SCr of 0.42 mg/dL (L)).   LIVER Recent Labs  Lab 03/14/22 0846 03/14/22 1259 03/15/22 0328  AST 20  --  21  ALT 15  --  16  ALKPHOS 55  --  60  BILITOT 0.4  --  0.5  PROT 6.7  --  7.4  ALBUMIN 2.7*  --  2.8*  INR  --  1.3*  --      INFECTIOUS Recent Labs  Lab 03/14/22 0847 03/16/22 1402 03/16/22 1930 03/17/22 0515  LATICACIDVEN 0.9 1.3  --   --   PROCALCITON  --   --  0.38 0.26     ENDOCRINE CBG (last 3)  Recent Labs    03/16/22 2337 03/17/22 0503 03/17/22 0805  GLUCAP 225* 105* 104*         IMAGING x48h  - image(s) personally visualized  -   highlighted in bold [place] EKG SITE RITE  Result Date: 03/16/2022 If Site Rite image not attached, placement could not be confirmed due to current cardiac rhythm.  DG Chest Port 1 View  Result Date: 03/16/2022 CLINICAL DATA:  [phone] with respiratory failure. EXAM: PORTABLE CHEST 1 VIEW COMPARISON:  AP Lat 03/14/2022.  FINDINGS: There is extensive interstitial fibrosis of both lungs with basal predominant honeycombing. Superimposed patchy airspace disease in the left-greater-than-right lung fields appears similar with no overt improvement or worsening. Cardiac size remains normal. The sulci are sharp. IMPRESSION: No interval change in left-greater-than-right airspace disease. Findings superimposed on extensive chronic change. Electronically Signed  By: Telford Nab M.D.   On: 03/16/2022 06:29

## 2022-03-17 NOTE — Progress Notes (Signed)
Progress Note   Patient: Rhonda Franco ZOX:[id number] DOB: 2001-01-21 DOA: 03/14/2022     2 DOS: the patient was seen and examined on 03/17/2022 at 9:05 AM      Brief hospital course: Mrs. Elders is a 21 y.o. F with SLE, SLE-related ILD, chronic respiratory failure on 2-3L at home, pHTN, hx RHF, OSA and obesity and anemia who presented with 2 days progrssive dyspnea in the setting of recently starting Tyvaso and titrating down prednisone from 10 to 7.5, now requiring >10L O2.  In the ER, HR 133, BP normal, SpO2 96% on 15L, WBC 21K, COVID negative, CXR with bilateral opacities.     9/6: Admitted on Lasix, IV steroids and BiPAP, pulm consulted 9/8: Switched to Azactam, PICC placed, milrinone started     Assessment and Plan: * Acute on chronic respiratory failure with hypoxia ([place]) Presented with dyspnea, much worse hypoxia, and new infiltrates on chest imaging.  We are working on a differential of worsening ILD (in setting of MMF cessation, tapering steroids, elevated CRP), change in pulmonary hypertension hemodynamics, or infection.  Diuresed several liters IV Lasix, then Bicarb rising so that was stopped 9/8.  Chest x-ray unchanged. Procal <1.  Not stable enough for bronchoscopy.  Milrinone started yesterday.  No change in O2 needs really overnight, slept well. Feeling less tired.  Possible RHC Monday.  Scvo2 75 prior to milrinone, 90 this morning.  - Continue Solu-Medrol 500 mg daily, day 3 of 3 - Famotidine and CBGs on high-dose steroids - Continue antibiotics, Azactam and azithromycin, day 4 of 5  -PICC line and milrinone started yesterday - Continue Tyvaso  - Defer infectious ddx (fungal?) to Pulmonology - Consult Pulmonlogy, appreciate expertise -Consult cardiology, appreciate expertise     Vaginal discomfort New vaginal discomfort after starting Levaquin.  Wet prep normal.  "Switch to Azactam.  Hypoalbuminemia Complicates diuresis  Obesity (BMI 30-39.9) BMI  36  Sleep apnea - Continue BiPAP at night  Pulmonary hypertension (HCC) Types 1 and 3 PAH.  See above. 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  Is tapering down from prednisone, just prior to onset of symptoms, had gone from 10 mg daily to 7.5 - Continue hydroxychloroquine - Continue steroids - Continue daily aspirin - Follow G6PD - Continue atovaquone          Subjective: Patient feels less tired, she slept well last night.  No sputum, no hemoptysis, no confusion, unchanged dyspnea.     Physical Exam: BP 118/64   Pulse (!) 103   Temp 97.7 F (36.5 C) (Oral)   Resp (!) 26   Ht _0  (1.651 m)   Wt 99.5 kg   SpO2 100%   BMI 36.50 kg/m   Obese adult female, lying in bed, no acute distress, using her Tyvaso, looking at her cell phone Tachycardic, regular, no murmurs, no pitting peripheral edema, JVP not visible to me due to body habitus Respiratory rate seems relatively normal at rest, patient actually good air movement bilaterally, maybe faint crackles at the right base, but actually not a lot, may be diminished on the left Abdomen soft without tenderness palpation or guarding, no ascites or distention Attention normal, affect appropriate, judgment and insight appear normal, face symmetric, speech fluent, upper extremity strength normal   Data Reviewed: Discussed with pulmonology Cox reviewed, SCV O2 elevated Glucose normal Sodium, potassium, creatinine normal on basic metabolic panel BNP 42 LDH normal Procalcitonin 0.38 and trending down,  troponin normal Respiratory virus panel negative    Family Communication: Mother and aunts at the bedside    Disposition: Status is: Inpatient         Author: Edwin Dada, MD 03/17/2022 9:22 AM  For on call review [email or web address].

## 2022-03-18 ENCOUNTER — Inpatient Hospital Stay (HOSPITAL_COMMUNITY): Payer: Medicaid Other

## 2022-03-18 DIAGNOSIS — J9621 Acute and chronic respiratory failure with hypoxia: Secondary | ICD-10-CM | POA: Diagnosis not present

## 2022-03-18 DIAGNOSIS — E8809 Other disorders of plasma-protein metabolism, not elsewhere classified: Secondary | ICD-10-CM | POA: Diagnosis not present

## 2022-03-18 DIAGNOSIS — E669 Obesity, unspecified: Secondary | ICD-10-CM | POA: Diagnosis not present

## 2022-03-18 DIAGNOSIS — I272 Pulmonary hypertension, unspecified: Secondary | ICD-10-CM | POA: Diagnosis not present

## 2022-03-18 LAB — BASIC METABOLIC PANEL
Anion gap: 4 — ABNORMAL LOW (ref 5–15)
BUN: 10 mg/dL (ref 6–20)
CO2: 40 mmol/L — ABNORMAL HIGH (ref 22–32)
Calcium: 8.7 mg/dL — ABNORMAL LOW (ref 8.9–10.3)
Chloride: 94 mmol/L — ABNORMAL LOW (ref 98–111)
Creatinine, Ser: 0.4 mg/dL — ABNORMAL LOW (ref 0.44–1.00)
GFR, Estimated: >60 mL/min (ref >60)
Glucose, Bld: 105 mg/dL — ABNORMAL HIGH (ref 70–99)
Potassium: 4.1 mmol/L (ref 3.5–5.1)
Sodium: 138 mmol/L (ref 135–145)

## 2022-03-18 LAB — CBC
HCT: 33.2 % — ABNORMAL LOW (ref 36.0–46.0)
Hemoglobin: 9.8 g/dL — ABNORMAL LOW (ref 12.0–15.0)
MCH: 25.8 pg — ABNORMAL LOW (ref 26.0–34.0)
MCHC: 29.5 g/dL — ABNORMAL LOW (ref 30.0–36.0)
MCV: 87.4 fL (ref 80.0–100.0)
Platelets: 386 10*3/uL (ref 150–400)
RBC: 3.8 MIL/uL — ABNORMAL LOW (ref 3.87–5.11)
RDW: 14.8 % (ref 11.5–15.5)
WBC: 16.8 10*3/uL — ABNORMAL HIGH (ref 4.0–10.5)
nRBC: 0 % (ref 0.0–0.2)

## 2022-03-18 LAB — GLUCOSE, CAPILLARY
Glucose-Capillary: 112 mg/dL — ABNORMAL HIGH (ref 70–99)
Glucose-Capillary: 105 mg/dL — ABNORMAL HIGH (ref 70–99)
Glucose-Capillary: 88 mg/dL (ref 70–99)
Glucose-Capillary: 144 mg/dL — ABNORMAL HIGH (ref 70–99)
Glucose-Capillary: 88 mg/dL (ref 70–99)
Glucose-Capillary: 124 mg/dL — ABNORMAL HIGH (ref 70–99)
Glucose-Capillary: 137 mg/dL — ABNORMAL HIGH (ref 70–99)
Glucose-Capillary: 239 mg/dL — ABNORMAL HIGH (ref 70–99)

## 2022-03-18 LAB — LEGIONELLA PNEUMOPHILA SEROGP 1 UR AG

## 2022-03-18 LAB — COOXEMETRY PANEL
Carboxyhemoglobin: 1.8 % — ABNORMAL HIGH (ref 0.5–1.5)
Methemoglobin: <0.7 % (ref 0.0–1.5)
O2 Saturation: 86.7 %
Total hemoglobin: 10.2 g/dL — ABNORMAL LOW (ref 12.0–16.0)

## 2022-03-18 LAB — PROCALCITONIN: Procalcitonin: <0.1 ng/mL

## 2022-03-18 MED ORDER — METHYLPREDNISOLONE SODIUM SUCC 125 MG IJ SOLR
60.0000 mg | Freq: Every day | INTRAMUSCULAR | Status: DC
  Administered 2022-03-18 – 2022-03-20 (×3): 60 mg via INTRAVENOUS
  Filled 2022-03-18 (×3): qty 2

## 2022-03-18 MED ORDER — TORSEMIDE 20 MG PO TABS
20.0000 mg | ORAL_TABLET | Freq: Two times a day (BID) | ORAL | Status: DC
  Administered 2022-03-19 – 2022-03-23 (×8): 20 mg via ORAL
  Filled 2022-03-18 (×8): qty 1

## 2022-03-18 MED ORDER — POLYETHYLENE GLYCOL 3350 17 G PO PACK: 17.0000 g | PACK | Freq: Two times a day (BID) | ORAL | Status: DC | PRN

## 2022-03-18 MED ORDER — FUROSEMIDE 10 MG/ML IJ SOLN
120.0000 mg | Freq: Once | INTRAVENOUS | Status: AC
  Administered 2022-03-18: 120 mg via INTRAVENOUS
  Filled 2022-03-18: qty 10

## 2022-03-18 NOTE — Progress Notes (Signed)
PT Cancellation Note  Patient Details Name: Rhonda Franco MRN: [id number] DOB: Sep 06, 2000   Cancelled Treatment:    Reason Eval/Treat Not Completed: Patient not medically ready. Will check back another day.    [facility]  Office: [phone] Pager: [phone]

## 2022-03-18 NOTE — Progress Notes (Signed)
Progress Note   Patient: Rhonda Franco MHD:[id number] DOB: 2000-10-13 DOA: 03/14/2022     3 DOS: the patient was seen and examined on 03/18/2022       Brief hospital course: [name] is a 21 y.o. F with SLE, SLE-related ILD, chronic respiratory failure on 2-3L at home, pHTN, hx RHF, OSA and obesity and anemia who presented with 2 days progrssive dyspnea in the setting of recently starting Tyvaso and titrating down prednisone from 10 to 7.5, now requiring >10L O2.  In the ER, HR 133, BP normal, SpO2 96% on 15L, WBC 21K, COVID negative, CXR with bilateral opacities.     9/6: Admitted on Lasix, IV steroids and BiPAP, pulm consulted 9/8: Switched to Azactam, PICC placed, milrinone started 9/10: No change; milrinone stopped     Assessment and Plan: * Acute on chronic respiratory failure with hypoxia (HCC) Presented with dyspnea, much worse hypoxia, and new infiltrates on chest imaging.  We are working on a differential of worsening ILD (in setting of MMF cessation, tapering steroids, elevated CRP), change in pulmonary hypertension hemodynamics, or infection.  Diuresed several liters IV Lasix, then Bicarb rising so that was stopped 9/8.     Milrinone started, she developed ectopy but no improvement in oxygenation.  Milrinone stopped and her SCV O2 remained adequate, pulmonology discussed with advanced heart failure team, and it really seems as if or pulmonology mild primary driver here.   Completed 3 days of Solu-Medrol 500 mg daily - Continue Solu-Medrol with taper down to 60 mg daily - Continue antibiotics, Azactam and azithromycin, day 5 of 5 - Continue Tyvaso - Consult pulmonology and cardiology, appreciate expertise     Vaginal discomfort New vaginal discomfort after starting Levaquin.  Wet prep normal.  "Switch to Azactam.  Hypoalbuminemia Complicates diuresis  Obesity (BMI 30-39.9) BMI 36  Sleep apnea - Continue BiPAP at night  Pulmonary hypertension  (HCC) Types 1 and 3 PAH.  See above. 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  Is tapering down from prednisone, just prior to onset of symptoms, had gone from 10 mg daily to 7.5.  G6PD normal. - Continue hydroxychloroquine - Continue steroids - Continue daily aspirin - Continue atovaquone  Sinus bradycardia Asymptomatic, not on nodal blocking agents - Continue telemetry monitoring       Subjective: Patient feels tired, she has occasional cough, she is fairly weak.  She has had no fever, no sputum, hemoptysis, confusion, respiratory distress.     Physical Exam: BP 128/62   Pulse (!) 49   Temp 98 F (36.7 C) (Oral)   Resp (!) 23   Ht _0  (1.651 m)   Wt 103 kg   SpO2 100%   BMI 37.79 kg/m   Obese adult female, eating breakfast, scrolling on her phone RRR, no murmurs, no peripheral edema Respiratory rate normal, no wheezing, lung sounds overall diminished, bilateral appreciate rales Abdomen soft without tenderness palpation Attention normal, affect appropriate, judgment and insight appear normal    Data Reviewed: Discussed with pulmonology Coags reviewed, SCV O2 is normal off milrinone Sodium, potassium, creatinine normal Strep pneumo negative Chest x-ray, possibly slightly better, but overall still severe opacities, bilaterally, worse on the left Procalcitonin undetectable White blood cell count down to 16 Glucose normal Hemoglobin slightly down to 9.8  Family Communication: Mother at bedside    Disposition: Status is: Inpatient The patient remains on 25 L of oxygen  Increasingly concerned which not  responding to steroids, antibiotics, diuretics, or inotropes.  We will begin favor transfer to [facility], where her rheumatologist, pulmonologist and cardiologist are located.        Author: Edwin Dada, MD 03/18/2022 8:56 AM  For on call review [email or web address].

## 2022-03-18 NOTE — Progress Notes (Signed)
Per MD Brand Males, trial patient lying flat to observe O2 saturations. Patient laid flat for five minutes; during this time O2 saturations between 92-95%. MD Brand Males and Myrene Buddy notified.

## 2022-03-18 NOTE — Progress Notes (Signed)
NAME:  Rhonda Franco, MRN:  [id number], DOB:  2001/03/11, LOS: 3 ADMISSION DATE:  03/14/2022, CONSULTATION DATE:  03/14/22 REFERRING MD:  Olevia Bowens CHIEF COMPLAINT:  Dyspnea   History of Present Illness:  Rhonda Franco is a 21 y.o. female who has a PMH as outlined below including but not limited to [facility] and SLE related ILD (followed at [facility] and has had mention of transplant consideration; however, not at goal weight yet). She uses 3 L of oxygen at rest and up to 10 L with activity. She has been managed with Myfortic starting in May 2022 along with prednisone and was switched to Cytoxan. Recently, she was started on Tyvaso. Per her last notes from [facility] (dated 12/20/21), she is currently on Atovaquone Hydroxychloroquine, Mycophenolate, Prednisone.  She presented to [facility] ED 9/6 with worsening dyspnea. She apparently had her Prednisone decreased from 15m to 7.577mdaily 3 - 4 weeks ago. Since then she has had progressive dyspnea. [facility] and was given Lasix and a dose of abx.  In ED today, she was noted to have distress and hypoxia. She was placed on BiPAP which she initially declined but later agreed to. She received 4066masix and has yet to have any UOP. CXR showed bilateral ASD concerning for edema vs infection along with chronic interstitial changes with honeycombing.  She continued to have respiratory rates in the 40's and 50's; hence, PCCM called in consultation.  She is currently anxious but is tolerating BiPAP ok. She does endorse anxiety and claustrophobia and she did receive 0.5mg62mivan earlier to assist with comfort of anxious and claustrophobia.  Pertinent  Medical History:  has Pericardial effusion; Family history of diabetes mellitus; Allergic rhinitis; Acanthosis nigricans; BMI 40.0-44.9, adult (HCC)[place]; High risk heterosexual behavior; Class 3 severe obesity due to excess calories without serious comorbidity with body mass index (BMI) of  40.0 to 44.9 in adult ([facility]; Elevated blood pressure reading without diagnosis of hypertension; Interstitial lung disease due to connective tissue disease (HCC)Invernessystemic lupus erythematosus with lung involvement (HCC)Easleyystemic lupus erythematosus, unspecified (HCC)[place] on chronic respiratory failure with hypoxia (HCC)Bagleytypical chest pain; Acute right ventricular heart failure (HCC)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   -> FVC 1.27L/38%, DLCO 7 - .o. female seen for SLE, a high-risk disease with multiple issues. SLE (2018) +ANA, +Sm, +RNP, +Ro, +dsDNA, ACA IgA 15, +coombs hemoyltic anemia, +lupus anticoagulant. pericardial effusion s/p window, ILD with fibrosis. systolic dysfunction (EF 50%)76%TX, Cellcept, Myfortic (2022-), Benlysta, HCQ. nexplanon. Cytoxan (12/202-11/2021). Rituxan 1000mg52mw 01/2202.    [facility]  Severe ILD with fibrosis and pulm hypertension followed by pulmonary. Has also had right sided heart failure. Now s/p Cytoxan, followed by recent Rituxan. Recently in OSH with increased SOB with increased edema and hypokalemia, diuretics given and fluid improved, also started on abx Levaquin. Will continue prednisone taper as this may be contributing. Once she completes antibitoics can resume Myfortic.     Significant Hospital Events: Including procedures, antibiotic start and stop dates in addition to other pertinent events   9/6 admit.  9.8 - Remained on BiPAP overnight. Back to [facility] AM but desaturated during Tyvaso treatment. 1300 UOP overnight, net 2.2L. Feels about the same.  -  IPAL: Full code  - started milirnone empiric  - PICC Placed  9/9 - NSVTACh this morning on 0.25 of milrinone and  stopped. On 65% 25L HFNC. BiPAP Qhs with 2 application in day/ Afebrile/ Is -1.5L Negative. Coox 90 on milrinone. Unable to lie flat due to desats. Currently pulse ox 95%. Havin some nausea after food and is constipated . Mom and aunt at bedside. Levaquin changed to Azactaim  yesterday due to vaginal itch  Interim History / Subjective:   9/10 - coox yesterday off milrinon was > 70. PCT < 0.1 STill on HHFNC just dropped to 50% fio2 and 25L [place]. 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 Some better per [facility] MD. Doing biPAp x 2 in daay and also QHS. Completed IV solumedrol. Started 62m prednisone today. Tyvasoc continues. Mom at beside. Afebrile   Objective:  Blood pressure (!) 117/59, pulse 86, temperature 98 F (36.7 C), temperature source Oral, resp. rate 18, height _0  (1.651 m), weight 103 kg, SpO2 95 %.    FiO2 (%):  [65 %] 65 %   Intake/Output Summary (Last 24 hours) at 03/18/2022 1051 Last data filed at 03/18/2022 0900 Gross per 24 hour  Intake 715.45 ml  Output 394 ml  Net 321.45 ml   Filed Weights   03/14/22 0834 03/16/22 0442 03/18/22 0500  Weight: 98.4 kg 99.5 kg 103 kg    Examination: General Appearance:  Looks better. Obese. No distres Head:  Normocephalic, without obvious abnormality, atraumatic Eyes:  PERRL - yes, conjunctiva/corneas - mddy     Ears:  Normal external ear canals, both ears Nose:  G tube - HHFNC Throat:  ETT TUBE - no , OG tube - no Neck:  Supple,  No enlargement/tenderness/nodules Lungs: RR 20s . CRACKLES HEARD diffuse Heart:  S1 and S2 normal, no murmur, CVP - no.  Pressors - not Abdomen:  Soft, no masses, no organomegaly Genitalia / Rectal:  Not done Extremities:  Extremities- intact Skin:  ntact in exposed areas . Sacral area - x Neurologic:  Sedation - none -> RASS - +1 . Moves all 4s - yes. CAM-ICU - neg . Orientation - x3+       Assessment & Plan:   Acute on chronic hypoxic respiratory failure - secondary to baseline pulmonary hypertension and  ILD iwith  honeycombing n the setting of SLE.  - High concern for ILD flare up NOS s/p high dose 3d solumedrol ending 03/18/22 - ? In contex of opd pred taper - as of 03/18/22 less evidence for decompensated cor pulmonale (milrinone did not help and did not tolerate), BNP nromal, Coox off milrinone normal)   03/18/2022 -  ? Slgith better severe ypoxemia. Dropped to 50% fio2 at 25L [place]/ Holding off on HRCT due to HLeon Valleyto maintain O2 saturations greater than 92%, wean as able (racial pulse ox gap ) - BiPAP Qh sand 2 applications x 2h each in day - Prednisone 654mfrom 03/18/22 and slow taper - Continue home Atovaquone, Hydroxychloroquine. - Continue tyvaso -  Hold home Mycophenolate for now. - Intubate if worse  - Hold off RHC (on HHFNC, an also less evidence for cor pulmonale decomp) - but reconsider if needed   At risk for steroid induced hyperglycemia. - SSI.  Best practice (evaluated daily):  Per primary team.    Interdisciplinary Goals of Care Family Meeting   Date carried out: 03/17/2022 and 03/17/22  Location of the meeting: Bedside  Member's involved:  Physician, Bedside Registered Nurse, Family Member or next of kin, and Other: RN Percell Miller, Mother and aunt  Durable Power of Forensic psychologist or Loss adjuster, chartered: Mom    Discussion: We discussed goals of care for [employer] .  Current status  Code status: Full Code  Disposition: Continue current acute care - they are contemplating no CPR  Time spent for the meeting: 10 min      [place]   The patient Jaden Cadle is critically ill with multiple organ systems failure and requires high complexity decision making for assessment and support, frequent evaluation and titration of therapies, application of advanced monitoring technologies and extensive interpretation of multiple databases.   Critical Care Time devoted to patient care services described in this note is  35   Minutes. This time reflects time of care of this signee Dr Brand Males. This critical care time does not reflect procedure time, or teaching time or supervisory time of PA/NP/Med student/Med Resident etc but could involve care discussion time     Dr. Brand Males, M.D., [facility].C.P Pulmonary and Critical Care Medicine Medical Director - [address] [facility] ICU Staff Physician, [facility] Pager: [phone], If no answer or between  15:00h - 7:00h: call [phone]  03/18/2022 11:06 AM    LABS    PULMONARY Recent Labs  Lab 03/14/22 0900 03/16/22 1930 03/17/22 0538 03/17/22 1159 03/18/22 0500  HCO3 31.5*  --   --   --   --   O2SAT 88.2 76.5 90.6 78.6 86.7    CBC Recent Labs  Lab 03/15/22 0328 03/15/22 1023 03/16/22 0329 03/18/22 0500  HGB 11.1* 11.8 10.4* 9.8*  HCT 37.3  --  34.4* 33.2*  WBC 24.4*  --  20.3* 16.8*  PLT 494*  --  498* 386    COAGULATION Recent Labs  Lab 03/14/22 1259  INR 1.3*    CARDIAC  No results for input(s): "TROPONINI" in the last 168 hours. No results for input(s): "PROBNP" in the last 168 hours.   CHEMISTRY Recent Labs  Lab 03/14/22 0846 03/15/22 0328 03/16/22 0329 03/17/22 0515 03/18/22 0500  NA 141 143 138 137 138  K 3.5 4.1 4.1 3.5 4.1  CL 107 99 91* 92* 94*  CO2 28 35* 39* 38* 40*  GLUCOSE 106* 115* 114* 122* 105*  BUN _0 CREATININE 0.48 0.46 0.50 0.42* 0.40*  CALCIUM 8.5* 9.1 8.9 8.5* 8.7*  MG 1.9  --  1.9  --   --   PHOS 3.7  --  3.7  --   --    Estimated Creatinine Clearance: 131.3 mL/min (A) (by C-G formula based on SCr of 0.4 mg/dL (L)).   LIVER Recent Labs  Lab 03/14/22 0846 03/14/22 1259 03/15/22 0328  AST 20  --  21  ALT 15  --  16  ALKPHOS 55  --  60  BILITOT 0.4  --  0.5  PROT 6.7  --  7.4  ALBUMIN 2.7*  --  2.8*  INR  --  1.3*  --      INFECTIOUS Recent Labs  Lab 03/14/22 0847 03/16/22 1402 03/16/22 1930 03/17/22 0515  03/18/22 0500  LATICACIDVEN 0.9 1.3  --   --   --   PROCALCITON  --   --  0.38 0.26 <0.10     ENDOCRINE CBG (last 3)  Recent Labs    03/17/22 2327 03/18/22  0359 03/18/22 0822  GLUCAP 112* 105* 88         IMAGING x48h  - image(s) personally visualized  -   highlighted in bold DG CHEST PORT 1 VIEW  Result Date: 03/18/2022 CLINICAL DATA:  Follow-up for airspace disease and dyspnea. EXAM: PORTABLE CHEST 1 VIEW COMPARISON:  03/16/2022 FINDINGS: 5:59 a.m. basal predominant fibrosis and honeycombing is present with patchy bilateral superimposed airspace disease, sparing of the right base. There is mild improvement in aeration in the left upper to mid lung field with somewhat less confluence of the opacities. Remainder of the bilateral infiltrates are unchanged. No pleural collection is seen. The cardiac size is normal. Interval right PICC insertion with tip at the superior cavoatrial junction. IMPRESSION: Bilateral airspace disease with mild improvement left upper to mid lung field. No new or worsening infiltrate. Electronically Signed   By: Telford Nab M.D.   On: 03/18/2022 07:38   [place] EKG SITE RITE  Result Date: 03/16/2022 If Site Rite image not attached, placement could not be confirmed due to current cardiac rhythm.

## 2022-03-18 NOTE — Progress Notes (Signed)
Pt is aware that she is supposed to wear bipap 2 hrs during the day. She doesnot want to go on bipap at this time. I told her to let us know when got ready to take her nap so we can put her on.RN aware

## 2022-03-19 DIAGNOSIS — E8809 Other disorders of plasma-protein metabolism, not elsewhere classified: Secondary | ICD-10-CM | POA: Diagnosis not present

## 2022-03-19 DIAGNOSIS — I50812 Chronic right heart failure: Secondary | ICD-10-CM

## 2022-03-19 DIAGNOSIS — E669 Obesity, unspecified: Secondary | ICD-10-CM | POA: Diagnosis not present

## 2022-03-19 DIAGNOSIS — J9621 Acute and chronic respiratory failure with hypoxia: Secondary | ICD-10-CM | POA: Diagnosis not present

## 2022-03-19 DIAGNOSIS — J810 Acute pulmonary edema: Principal | ICD-10-CM

## 2022-03-19 DIAGNOSIS — E876 Hypokalemia: Secondary | ICD-10-CM

## 2022-03-19 DIAGNOSIS — I272 Pulmonary hypertension, unspecified: Secondary | ICD-10-CM | POA: Diagnosis not present

## 2022-03-19 LAB — GLUCOSE, CAPILLARY
Glucose-Capillary: 163 mg/dL — ABNORMAL HIGH (ref 70–99)
Glucose-Capillary: 212 mg/dL — ABNORMAL HIGH (ref 70–99)
Glucose-Capillary: 62 mg/dL — ABNORMAL LOW (ref 70–99)
Glucose-Capillary: 65 mg/dL — ABNORMAL LOW (ref 70–99)
Glucose-Capillary: 71 mg/dL (ref 70–99)
Glucose-Capillary: 78 mg/dL (ref 70–99)
Glucose-Capillary: 78 mg/dL (ref 70–99)
Glucose-Capillary: 90 mg/dL (ref 70–99)
Glucose-Capillary: 94 mg/dL (ref 70–99)

## 2022-03-19 LAB — BASIC METABOLIC PANEL
Anion gap: 8 (ref 5–15)
BUN: 11 mg/dL (ref 6–20)
CO2: 41 mmol/L — ABNORMAL HIGH (ref 22–32)
Calcium: 8.2 mg/dL — ABNORMAL LOW (ref 8.9–10.3)
Chloride: 89 mmol/L — ABNORMAL LOW (ref 98–111)
Creatinine, Ser: 0.36 mg/dL — ABNORMAL LOW (ref 0.44–1.00)
GFR, Estimated: >60 mL/min (ref >60)
Glucose, Bld: 72 mg/dL (ref 70–99)
Potassium: 2.8 mmol/L — ABNORMAL LOW (ref 3.5–5.1)
Sodium: 138 mmol/L (ref 135–145)

## 2022-03-19 LAB — CBC
HCT: 35.6 % — ABNORMAL LOW (ref 36.0–46.0)
Hemoglobin: 10.6 g/dL — ABNORMAL LOW (ref 12.0–15.0)
MCH: 25.5 pg — ABNORMAL LOW (ref 26.0–34.0)
MCHC: 29.8 g/dL — ABNORMAL LOW (ref 30.0–36.0)
MCV: 85.6 fL (ref 80.0–100.0)
Platelets: 397 10*3/uL (ref 150–400)
RBC: 4.16 MIL/uL (ref 3.87–5.11)
RDW: 14.9 % (ref 11.5–15.5)
WBC: 17 10*3/uL — ABNORMAL HIGH (ref 4.0–10.5)
nRBC: 0 % (ref 0.0–0.2)

## 2022-03-19 MED ORDER — INSULIN ASPART 100 UNIT/ML IJ SOLN: 0.0000 [IU] | Freq: Three times a day (TID) | INTRAMUSCULAR | Status: DC

## 2022-03-19 MED ORDER — DEXTROSE 50 % IV SOLN
INTRAVENOUS | Status: AC
  Filled 2022-03-19: qty 50

## 2022-03-19 MED ORDER — ACETAZOLAMIDE 250 MG PO TABS
250.0000 mg | ORAL_TABLET | Freq: Two times a day (BID) | ORAL | Status: DC
  Administered 2022-03-19 – 2022-03-26 (×14): 250 mg via ORAL
  Filled 2022-03-19 (×14): qty 1

## 2022-03-19 MED ORDER — POTASSIUM CHLORIDE 10 MEQ/50ML IV SOLN
10.0000 meq | INTRAVENOUS | Status: AC
  Administered 2022-03-19 (×6): 10 meq via INTRAVENOUS
  Filled 2022-03-19 (×6): qty 50

## 2022-03-19 MED ORDER — VITAMIN D (ERGOCALCIFEROL) 1.25 MG (50000 UNIT) PO CAPS
50000.0000 [IU] | ORAL_CAPSULE | ORAL | Status: DC
  Administered 2022-03-19: 50000 [IU] via ORAL
  Filled 2022-03-19: qty 1

## 2022-03-19 MED ORDER — INSULIN ASPART 100 UNIT/ML IJ SOLN: 0.0000 [IU] | Freq: Every day | INTRAMUSCULAR | Status: DC

## 2022-03-19 MED ORDER — DEXTROSE 50 % IV SOLN
12.5000 g | INTRAVENOUS | Status: AC
  Administered 2022-03-19: 12.5 g via INTRAVENOUS

## 2022-03-19 NOTE — Assessment & Plan Note (Signed)
Persistent.  Mag normal. - Continue K supplement

## 2022-03-19 NOTE — Evaluation (Signed)
Physical Therapy Evaluation Patient Details Name: Rhonda Franco MRN: [id number] DOB: 07-16-2000 Today's Date: 03/19/2022  History of Present Illness  21 y.o. F with SLE, SLE-related ILD, chronic respiratory failure on 3-10 LPM at home, pHTN, hx RHF, OSA and obesity and anemia who presented9/6/23  with 2 days progrssive dyspnea in the setting of recently starting Tyvaso and titrating down prednisone from 10 to 7.5, now requiring >10L O2.  Clinical Impression  The patient admitted with above medical problems. Patient  resting in bed on 25 L, 45% FIo2. Spo2 94%, HR 90  , 20- RR  Patient  mobilizes independently with assist for lines/tubes. Patient stood at bedside, performed 2 sets step in place  x 10 reps. With UE support. SPo2  85%, HR max 137. RR 25.  Returned to 90% , HR 110 with rest and settled in bed.   Patient interested in  keeping strength while in hospital. Will provide bands for UE strength per patient  request. Pt admitted with above diagnosis.  Pt currently with functional limitations due to the deficits listed below (see PT Problem List). Pt will benefit from skilled PT to increase their independence and safety with mobility to allow discharge to the venue listed below.        Recommendations for follow up therapy are one component of a multi-disciplinary discharge planning process, led by the attending physician.  Recommendations may be updated based on patient status, additional functional criteria and insurance authorization.  Follow Up Recommendations No PT follow up      Assistance Recommended at Discharge None  Patient can return home with the following  Help with stairs or ramp for entrance;Assist for transportation;Assistance with cooking/housework    Equipment Recommendations None recommended by PT  Recommendations for Other Services    OT, energy conservation.   Functional Status Assessment Patient has had a recent decline in their functional status and  demonstrates the ability to make significant improvements in function in a reasonable and predictable amount of time.     Precautions / Restrictions Precautions Precaution Comments: monitor sats and HR- goes up to 130's      Mobility  Bed Mobility Overal bed mobility: Independent                  Transfers Overall transfer level: Needs assistance   Transfers: Sit to/from Stand, Bed to chair/wheelchair/BSC Sit to Stand: Supervision   Step pivot transfers: Supervision       General transfer comment: assist with lines only    Ambulation/Gait Ambulation/Gait assistance: Supervision             General Gait Details: stood and held to BiPAP  and stepped in place 10 x's 2 sets.. HR 137 max, SPO2 85% low on 25L, 45% Fio2  Stairs            Wheelchair Mobility    Modified Rankin (Stroke Patients Only)       Balance Overall balance assessment: Independent                                           Pertinent Vitals/Pain Pain Assessment Pain Assessment: No/denies pain    Home Living Family/patient expects to be discharged to:: Private residence Living Arrangements: Parent Available Help at Discharge: Family;Available 24 hours/day Type of Home: Mobile home Home Access: Stairs to enter Entrance Stairs-Rails: Right;Left Entrance Stairs-Number of Steps:  4   Home Layout: One level Home Equipment: None      Prior Function Prior Level of Function : Independent/Modified Independent             Mobility Comments: on O2       Hand Dominance   Dominant Hand: Right    Extremity/Trunk Assessment   Upper Extremity Assessment Upper Extremity Assessment: Overall WFL for tasks assessed    Lower Extremity Assessment Lower Extremity Assessment: Overall WFL for tasks assessed    Cervical / Trunk Assessment Cervical / Trunk Assessment: Normal  Communication   Communication: No difficulties  Cognition Arousal/Alertness:  Awake/alert Behavior During Therapy: WFL for tasks assessed/performed Overall Cognitive Status: Within Functional Limits for tasks assessed                                          General Comments      Exercises     Assessment/Plan    PT Assessment Patient needs continued PT services  PT Problem List Decreased activity tolerance;Cardiopulmonary status limiting activity       PT Treatment Interventions Therapeutic activities;Therapeutic exercise;Patient/family education;Functional mobility training    PT Goals (Current goals can be found in the Care Plan section)  Acute Rehab PT Goals Patient Stated Goal: go home PT Goal Formulation: With patient/family Time For Goal Achievement: 04/02/22 Potential to Achieve Goals: Good    Frequency Min 3X/week     Co-evaluation               AM-PAC PT "6 Clicks" Mobility  Outcome Measure Help needed turning from your back to your side while in a flat bed without using bedrails?: None Help needed moving from lying on your back to sitting on the side of a flat bed without using bedrails?: None Help needed moving to and from a bed to a chair (including a wheelchair)?: A Little Help needed standing up from a chair using your arms (e.g., wheelchair or bedside chair)?: A Little Help needed to walk in hospital room?: A Lot Help needed climbing 3-5 steps with a railing? : A Lot 6 Click Score: 18    End of Session Equipment Utilized During Treatment: Oxygen Activity Tolerance: Patient tolerated treatment well Patient left: in bed;with call bell/phone within reach;with family/visitor present Nurse Communication: Mobility status PT Visit Diagnosis: Difficulty in walking, not elsewhere classified (R26.2)    Time: 7357-8978 PT Time Calculation (min) (ACUTE ONLY): 25 min   Charges:   PT Evaluation $PT Eval Low Complexity: 1 Low PT Treatments $Therapeutic Activity: 8-22 mins        [place] Office [phone] Weekend [phone]   Claretha Cooper 03/19/2022, 12:00 PM

## 2022-03-19 NOTE — Progress Notes (Signed)
NAME:  Rhonda Franco, MRN:  [id number], DOB:  [birth year], LOS: 4 ADMISSION DATE:  03/14/2022, CONSULTATION DATE:  03/14/22 REFERRING MD:  Olevia Bowens CHIEF COMPLAINT:  Dyspnea   History of Present Illness:  Rhonda Franco is a 21 y.o. female who has a PMH as outlined below including but not limited to [facility] and SLE related ILD (followed at [facility] and has had mention of transplant consideration; however, not at goal weight yet). She uses 3 L of oxygen at rest and up to 10 L with activity. She has been managed with Myfortic starting in May 2022 along with prednisone and was switched to Cytoxan. Recently, she was started on Tyvaso. Per her last notes from [facility] (dated 12/20/21), she is currently on Atovaquone Hydroxychloroquine, Mycophenolate, Prednisone.  She presented to [facility] ED 9/6 with worsening dyspnea. She apparently had her Prednisone decreased from 56m to 7.536mdaily 3 - 4 weeks ago. Since then she has had progressive dyspnea. [facility] and was given Lasix and a dose of abx.  In ED today, she was noted to have distress and hypoxia. She was placed on BiPAP which she initially declined but later agreed to. She received 4045masix and has yet to have any UOP. CXR showed bilateral ASD concerning for edema vs infection along with chronic interstitial changes with honeycombing.  She continued to have respiratory rates in the 40's and 50's; hence, PCCM called in consultation.  She is currently anxious but is tolerating BiPAP ok. She does endorse anxiety and claustrophobia and she did receive 0.5mg5mivan earlier to assist with comfort of anxious and claustrophobia.  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   -> FVC 1.27L/38%, DLCO 7 - .o. female seen for SLE, a high-risk disease with multiple issues. SLE (2018) +ANA, +Sm, +RNP, +Ro, +dsDNA, ACA IgA 15, +coombs hemoyltic anemia, +lupus anticoagulant. pericardial effusion s/p window, ILD with fibrosis. systolic dysfunction (EF 50%)92%TX, Cellcept, Myfortic (2022-), Benlysta, HCQ. nexplanon. Cytoxan (12/202-11/2021). Rituxan 1000mg56mw 01/2202.    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 Has also had right sided heart failure. Now s/p Cytoxan, followed by recent Rituxan. Recently in OSH with increased SOB with increased edema and hypokalemia, diuretics given and fluid improved, also started on abx Levaquin. Will continue prednisone taper as this may be contributing. Once she completes antibitoics can resume Myfortic.     Significant Hospital Events: Including procedures, antibiotic start and stop dates in addition to other pertinent events   9/6 admit.  9.8 - Remained on BiPAP overnight. [place] but desaturated during Tyvaso treatment. 1300 UOP overnight, net 2.2L. Feels about the same.  -  IPAL: Full code  - started milirnone empiric  - PICC Placed  9/9 - NSVTACh this morning on 0.25 of milrinone and  stopped. On 65% 25L HFNC. BiPAP Qhs with 2 application in day/ Afebrile/ Is -1.5L Negative. Coox 90 on milrinone. Unable to lie flat due to desats. Currently pulse ox 95%. Havin some nausea after food and is constipated . Mom and aunt at bedside. Levaquin changed to Azactaim  yesterday due to vaginal itch  9/10 - coox yesterday off milrinon was > 70. PCT < 0.1 STill on HHFNC just dropped to 50% fio2 and 25L [place]. 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 Some better per [facility]. Doing biPAp x 2 in daay and also QHS. Completed IV solumedrol. Started 52m prednisone today. Tyvasoc continues. Mom at beside. Afebrile  9/11 Feeling clinically improved, weaning HFNC  Interim History / Subjective:   No overnight events Feels subjectively less dyspneic  1.8L UOP yesterday, -2.7L since admission  Objective:  Blood pressure (!) 106/55, pulse (!) 104, temperature 98.4 F (36.9 C), temperature source Axillary, resp. rate (!) 23, height _0  (1.651 m), weight 100.8 kg, SpO2 96 %.    FiO2 (%):  [45 %-65 %] 45 %   Intake/Output Summary (Last 24 hours) at 03/19/2022 0947 Last data filed at 03/19/2022 0944 Gross per 24 hour  Intake 728.28 ml  Output 1662 ml  Net -933.72 ml    Filed Weights   03/16/22 0442 03/18/22 0500 03/19/22 0500  Weight: 99.5 kg 103 kg 100.8 kg      General:  ill-appearing F, sitting up in bed in no distress on HFNC HEENT: MM pink/moist Neuro: awake, alert, oriented and moving all extremities CV: s1s2 rrr, no m/r/g PULM:  diffuse crackles lower lobes, no tachypnea GI: soft, non-tender Extremities: warm/dry, trace pedal edema  Skin: no rashes or lesions    Assessment & Plan:   Acute on chronic hypoxic respiratory failure -  secondary to baseline pulmonary hypertension and ILD iwith  honeycombing n the setting of SLE.  High concern for ILD flare in the contex of outpatient pred tape.  Less evidence for decompensated cor pulmonale (milrinone did not help and did not tolerate), BNP  nromal, Coox off milrinone normal) PLAN - Continue HHFNC to maintain O2 saturations greater than 92%, wean as able, down from 65% to 45% this AM, remains on 25L, mobilize OOB as tolerated - BiPAP Qh sand 2 applications x 2h each in day -Continue Prednisone 632m - Continue home Atovaquone, Hydroxychloroquine. - Continue tyvaso -  Hold home Mycophenolate for now. - Hold off RHC (on HHFNC, an also less evidence for cor pulmonale decomp) - but reconsider if needed -consideration of [facility] transfer, however is improving and not a candidate for lung transplant currently until weight at goal -[facility] will [facility] following discharge   Steroid induced Hyperglycemia - SSI.  Best practice (evaluated daily):  Per primary team.     LABS    PULMONARY Recent Labs  Lab 03/14/22 0900 03/16/22 1930 03/17/22 0538 03/17/22 1159 03/18/22 0500  HCO3 31.5*  --   --   --   --   O2SAT 88.2 76.5 90.6 78.6 86.7     CBC Recent Labs  Lab 03/16/22 0329 03/18/22 0500 03/19/22 0500  HGB 10.4* 9.8* 10.6*  HCT 34.4* 33.2* 35.6*  WBC 20.3* 16.8* 17.0*  PLT 498* 386 397     COAGULATION Recent Labs  Lab 03/14/22 1259  INR 1.3*     CARDIAC  No results for input(s): "TROPONINI" in the last 168 hours. No results for input(s): "PROBNP" in the last 168 hours.   CHEMISTRY Recent Labs  Lab 03/14/22 0846 03/15/22 0328 03/16/22 0329 03/17/22 0515 03/18/22 0500 03/19/22 0500  NA 141 143 138 137 138 138  K 3.5 4.1 4.1 3.5 4.1 2.8*  CL 107 99 91* 92* 94* 89*  CO2 28 35* 39* 38* 40* 41*  GLUCOSE 106* 115* 114* 122* 105* 72  BUN _0 CREATININE 0.48 0.46 0.50 0.42* 0.40* 0.36*  CALCIUM 8.5* 9.1 8.9 8.5* 8.7* 8.2*  MG 1.9  --  1.9  --   --   --   PHOS 3.7  --  3.7  --   --   --     Estimated Creatinine Clearance: 129.7 mL/min (A) (by C-G formula based on SCr of 0.36 mg/dL (L)).   LIVER Recent Labs  Lab  03/14/22 0846 03/14/22 1259 03/15/22 0328  AST 20  --  21  ALT 15  --  16  ALKPHOS 55  --  60  BILITOT 0.4  --  0.5  PROT 6.7  --  7.4  ALBUMIN 2.7*  --  2.8*  INR  --  1.3*  --       INFECTIOUS Recent Labs  Lab 03/14/22 0847 03/16/22 1402 03/16/22 1930 03/17/22 0515 03/18/22 0500  LATICACIDVEN 0.9 1.3  --   --   --   PROCALCITON  --   --  0.38 0.26 <0.10      ENDOCRINE CBG (last 3)  Recent Labs    03/19/22 0020 03/19/22 0053 03/19/22 0804  GLUCAP 62* 163* 71          IMAGING x48h  - image(s) personally visualized  -   highlighted in bold DG CHEST PORT 1 VIEW  Result Date: 03/18/2022 CLINICAL DATA:  Follow-up for airspace disease and dyspnea. EXAM: PORTABLE CHEST 1 VIEW COMPARISON:  03/16/2022 FINDINGS: 5:59 a.m. basal predominant fibrosis and honeycombing is present with patchy bilateral superimposed airspace disease, sparing of the right base. There is mild improvement in aeration in the left upper to mid lung field with somewhat less confluence of the opacities. Remainder of the bilateral infiltrates are unchanged. No pleural collection is seen. The cardiac size is normal. Interval right PICC insertion with tip at the superior cavoatrial junction. IMPRESSION: Bilateral airspace disease with mild improvement left upper to mid lung field. No new or worsening infiltrate. Electronically Signed   By: Telford Nab M.D.   On: 03/18/2022 07:38      Otilio Carpen Jeniece Hannis, PA-C [facility] See Amion for pager If no response to pager , please call [phone] until 7pm After 7:00 pm call Elink  [phone]?[place]

## 2022-03-19 NOTE — Inpatient Diabetes Management (Signed)
Inpatient Diabetes Program Recommendations  AACE/ADA: New Consensus Statement on Inpatient Glycemic Control (2015)  Target Ranges:  Prepandial:   less than 140 mg/dL      Peak postprandial:   less than 180 mg/dL (1-2 hours)      Critically ill patients:  140 - 180 mg/dL   Lab Results  Component Value Date   GLUCAP 71 03/19/2022   HGBA1C 5.7 (H) 03/14/2022    Review of Glycemic Control  Latest Reference Range & Units 03/18/22 17:25 03/18/22 20:17 03/19/22 00:20 03/19/22 00:53 03/19/22 08:04  Glucose-Capillary 70 - 99 mg/dL 137 (H) 239 (H) 62 (L) 163 (H) 71  (H): Data is abnormally high (L): Data is abnormally low  Diabetes history:  DM2 Outpatient Diabetes medications:  Prednisone 2.5 mg TID Current orders for Inpatient glycemic control:  Novolog 0-15 units Q4H Solumedrol 60 mg QD  Inpatient Diabetes Program Recommendations:    Please consider:  Novolog 0-9 units Q4H.  Currently on HFO2.  Once eating change to TID and HS.  Will continue to follow while inpatient.  Thank you, Reche Dixon, MSN, [place] Diabetes Coordinator Inpatient Diabetes Program [phone] (team pager from 8a-5p)

## 2022-03-19 NOTE — Assessment & Plan Note (Addendum)
No pitting edema.  Case discussed with Adv HF team and underwent trial milrinone but no improvement in ScvO2.  Was resumed on her home torsemide and acetazolamide but noted weight gain from her baseline. - Resume Lasix 120 mg IV  - Strict I/Os - Daily BMP  - Daily weight

## 2022-03-19 NOTE — Progress Notes (Signed)
  Progress Note   Patient: Rhonda Franco HYQ:[id number] DOB: 12-06-2000 DOA: 03/14/2022     4 DOS: the patient was seen and examined on 03/19/2022 at 9:30AM      Brief hospital course: [name] is a 21 y.o. F with SLE, SLE-related ILD, chronic respiratory failure on 2-3L at home, pHTN, hx RHF, OSA and obesity and anemia who presented with 2 days progrssive dyspnea in the setting of recently starting Tyvaso and titrating down prednisone from 10 to 7.5, now requiring >10L O2.  In the ER, HR 133, BP normal, SpO2 96% on 15L, WBC 21K, COVID negative, CXR with bilateral opacities.     9/6: Admitted on Lasix, IV steroids and BiPAP, pulm consulted 9/8: Switched to Azactam, PICC placed, milrinone started 9/10: No change; milrinone stopped     Assessment and Plan: * Acute on chronic respiratory failure with hypoxia (HCC) Presented with dyspnea, much worse hypoxia, and new infiltrates on chest imaging.  We are working on a differential of worsening ILD (in setting of MMF cessation, tapering steroids, elevated CRP), change in pulmonary hypertension hemodynamics, or infection.  Diuresed several liters IV Lasix, then Bicarb rising so that was stopped 9/8.     Milrinone started, she developed ectopy but no improvement in oxygenation.  Milrinone stopped and her SCV O2 remained adequate, pulmonology discussed with advanced heart failure team, and it really seems as if or pulmonology mild primary driver here.   Completed 3 days of Solu-Medrol 500 mg daily, 5 days antibiotics (levaquin --> Azactam/azithro) - Continue Solu-Medrol with taper down to 60 mg daily - Continue antibiotics, Azactam and azithromycin, day 5 of 5 - Continue Tyvaso - Consult pulmonology and cardiology, appreciate expertise     Chronic right heart failure (HCC) Appears euvolemic.  Trial milrinone but no improvement in ScvO2, discussed with Adv HF team and RHC unlikely to add value. - Continue torsemide  Hypokalemia -  Supplement K - Check mag  Hypoalbuminemia Complicates diuresis  Obesity (BMI 30-39.9) BMI 36  Sleep apnea - Continue BiPAP at night  Pulmonary hypertension (HCC) Types 1 and 3 PAH.  See above. 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  Is tapering down from prednisone, just prior to onset of symptoms, had gone from 10 mg daily to 7.5.  G6PD normal. - Continue hydroxychloroquine - Continue steroids - Continue daily aspirin - Continue atovaquone          Subjective: No change.  No hemoptysis, cough, fever, confusion.     Physical Exam: BP 127/72   Pulse 99   Temp 98.4 F (36.9 C) (Axillary)   Resp 19   Ht _0  (1.651 m)   Wt 100.8 kg   SpO2 99%   BMI 36.98 kg/m   Obese adult female, sitting in bed, interactive and appropriate Heart rate slow, regular, no pitting edema, JVP not visible Respiratory rate increased, no wheezing, lung sounds slightly better than previous, fine crackles at bases, fairly diminished given body habitus Abdomen soft without tenderness palpation or guarding Attention normal, affect appropriate, judgment insight appear normal  Data Reviewed: Discussed with pulmonology Patient metabolic panel shows hypokalemia Her blood cell count stable at 17, glucose mostly normal Hemoglobin up to 10.6  Family Communication: Mother at the bedside    Disposition: Status is: Inpatient         Author: Edwin Dada, MD 03/19/2022 5:05 PM  For on call review [email or web address].

## 2022-03-20 DIAGNOSIS — E8809 Other disorders of plasma-protein metabolism, not elsewhere classified: Secondary | ICD-10-CM | POA: Diagnosis not present

## 2022-03-20 DIAGNOSIS — E876 Hypokalemia: Secondary | ICD-10-CM

## 2022-03-20 DIAGNOSIS — I50812 Chronic right heart failure: Secondary | ICD-10-CM

## 2022-03-20 DIAGNOSIS — J9621 Acute and chronic respiratory failure with hypoxia: Secondary | ICD-10-CM | POA: Diagnosis not present

## 2022-03-20 LAB — BASIC METABOLIC PANEL
Anion gap: 7 (ref 5–15)
BUN: 13 mg/dL (ref 6–20)
CO2: 36 mmol/L — ABNORMAL HIGH (ref 22–32)
Calcium: 8.6 mg/dL — ABNORMAL LOW (ref 8.9–10.3)
Chloride: 93 mmol/L — ABNORMAL LOW (ref 98–111)
Creatinine, Ser: 0.44 mg/dL (ref 0.44–1.00)
GFR, Estimated: >60 mL/min (ref >60)
Glucose, Bld: 74 mg/dL (ref 70–99)
Potassium: 3.3 mmol/L — ABNORMAL LOW (ref 3.5–5.1)
Sodium: 136 mmol/L (ref 135–145)

## 2022-03-20 LAB — GLUCOSE, CAPILLARY
Glucose-Capillary: 71 mg/dL (ref 70–99)
Glucose-Capillary: 87 mg/dL (ref 70–99)
Glucose-Capillary: 159 mg/dL — ABNORMAL HIGH (ref 70–99)
Glucose-Capillary: 180 mg/dL — ABNORMAL HIGH (ref 70–99)

## 2022-03-20 LAB — POTASSIUM: Potassium: 2.9 mmol/L — ABNORMAL LOW (ref 3.5–5.1)

## 2022-03-20 LAB — MAGNESIUM: Magnesium: 1.7 mg/dL (ref 1.7–2.4)

## 2022-03-20 MED ORDER — PREDNISONE 20 MG PO TABS
60.0000 mg | ORAL_TABLET | Freq: Every day | ORAL | Status: AC
  Administered 2022-03-21 – 2022-03-23 (×3): 60 mg via ORAL
  Filled 2022-03-20 (×3): qty 3

## 2022-03-20 MED ORDER — FUROSEMIDE 10 MG/ML IJ SOLN
120.0000 mg | Freq: Once | INTRAVENOUS | Status: AC
  Administered 2022-03-20: 120 mg via INTRAVENOUS
  Filled 2022-03-20: qty 10

## 2022-03-20 MED ORDER — MAGNESIUM SULFATE 2 GM/50ML IV SOLN
2.0000 g | Freq: Once | INTRAVENOUS | Status: AC
  Administered 2022-03-20: 2 g via INTRAVENOUS
  Filled 2022-03-20: qty 50

## 2022-03-20 MED ORDER — INSULIN ASPART 100 UNIT/ML IJ SOLN
0.0000 [IU] | Freq: Three times a day (TID) | INTRAMUSCULAR | Status: DC
  Administered 2022-03-21 (×2): 2 [IU] via SUBCUTANEOUS
  Administered 2022-03-22: 1 [IU] via SUBCUTANEOUS
  Administered 2022-03-22 – 2022-03-25 (×4): 2 [IU] via SUBCUTANEOUS
  Administered 2022-03-25: 3 [IU] via SUBCUTANEOUS

## 2022-03-20 MED ORDER — POTASSIUM CHLORIDE 20 MEQ PO PACK
40.0000 meq | PACK | Freq: Once | ORAL | Status: AC
  Administered 2022-03-20: 40 meq via ORAL
  Filled 2022-03-20: qty 2

## 2022-03-20 MED ORDER — POTASSIUM CHLORIDE 10 MEQ/100ML IV SOLN
10.0000 meq | INTRAVENOUS | Status: AC
  Administered 2022-03-20 (×2): 10 meq via INTRAVENOUS
  Filled 2022-03-20 (×2): qty 100

## 2022-03-20 NOTE — Progress Notes (Signed)
Pt did not want to go on bipap at this time. She said maybe after lunch. Told her to notify RN when ready.

## 2022-03-20 NOTE — Progress Notes (Signed)
Progress Note   Patient: Rhonda Franco LOV:[id number] DOB: [birth year] DOA: 03/14/2022     5 DOS: the patient was seen and examined on 03/20/2022 at 8:20AM      Brief hospital course: Rhonda Franco is a 21 y.o. F with SLE, SLE-related ILD, chronic respiratory failure on 2-3L at home, pHTN, hx RHF, OSA and obesity and anemia who presented with 2 days progrssive dyspnea in the setting of recently starting Tyvaso and titrating down prednisone from 10 to 7.5, now requiring >10L O2.  In the ER, HR 133, BP normal, SpO2 96% on 15L, WBC 21K, COVID negative, CXR with bilateral opacities.     9/6: Admitted on Lasix, IV steroids and BiPAP, pulm consulted 9/7: 40L HFNC 9/8: Switched to Azactam, PICC placed, milrinone started, on 25L HFNC 9/10: No change; milrinone stopped 9/12: On 15L     Assessment and Plan: * Acute on chronic respiratory failure with hypoxia ([place]) Presented with dyspnea, worsening hypoxia, and new infiltrates on chest imaging.  Suspect ILD flare (in setting of MMF cessation, tapering steroids, elevated CRP).  Did not improve with antibiotics (doubt typical infection), trial milrinone unsuccessful (doubt this is a worsening of cardiopulmonary hemodynamics).  Completed 3 days of Solu-Medrol 500 mg daily, 5 days antibiotics (levaquin --> Azactam/azithro).    Not stable for bronchoscopy to evaluate for atypical infections, but improving on steroids.  Down to 15L today (at home on 3-4 at rest, 10L with exertion) - Continue Solu-Medrol at 60 mg daily, plan for slow taper - Continue Tyvaso - Consult pulmonology, appreciate expertise      Chronic right heart failure (HCC) No pitting edema.  Case discussed with Adv HF team and underwent trial milrinone but no improvement in ScvO2.  Was resumed on her home torsemide and acetazolamide but noted weight gain from her baseline. - Resume Lasix 120 mg IV  - Strict I/Os - Daily BMP  - Daily weight  Hypokalemia Persistent.  Mag  normal. - Continue K supplement  Hypoalbuminemia Complicates diuresis  Obesity (BMI 30-39.9) BMI 36  Sleep apnea - Continue BiPAP at night  Pulmonary hypertension (HCC) Types 1 and 3 PAH.  See above. 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  Is tapering down from prednisone, just prior to onset of symptoms, had gone from 10 mg daily to 7.5.  G6PD normal. - Continue hydroxychloroquine - Continue steroids - Continue daily aspirin - Continue atovaquone          Subjective: Patient is tired but no new findings.  No sputum, hemoptysis, confusion.  No chest pain.  She thinks that her weight is up, she has no particular edema however.     Physical Exam: BP (!) 131/93   Pulse (!) 109   Temp 98.6 F (37 C) (Oral)   Resp (!) 24   Ht _0  (1.651 m)   Wt 100.5 kg   SpO2 94%   BMI 36.87 kg/m   Adult female, lying in bed, talking with someone on her phone, face symmetric, speech fluent Heart rate elevated, no murmurs appreciated, no pitting in the lower extremities, JVP not visible due to body habitus Respiratory rate seems out of breath with exertion, but normal at rest, lung sounds clear, diminished overall, no rales or wheezes Abdomen soft without tenderness palpation or guarding Attention normal, affect blunted, judgment and insight appear normal, face metric, speech fluent    Data Reviewed: Discussed with pulmonology Labs notable for potassium  still 3.3, otherwise sodium and creatinine normal Glucose low Magnesium normal  Family Communication: Rhonda Franco at the bedside    Disposition: Status is: Inpatient The patient was admitted for respiratory failure.  She has advanced lung disease and poor prognosis overall.  She was treated with antibiotics, diuretics, and milrinone, none of which have made any appreciable improvement.  She also got Solu-Medrol 500 mg daily for 3 days, and that seems to  have helped, and she seems to be slowly gradually improving.  Unclear overall trajectory         Author: Edwin Dada, MD 03/20/2022 5:07 PM  For on call review [email or web address].

## 2022-03-20 NOTE — Progress Notes (Signed)
PT placed on bipap for the night. 

## 2022-03-20 NOTE — Progress Notes (Signed)
NAME:  Rhonda Franco, MRN:  [id number], DOB:  [birth year], LOS: 5 ADMISSION DATE:  03/14/2022, CONSULTATION DATE:  03/14/22 REFERRING MD:  Olevia Bowens CHIEF COMPLAINT:  Dyspnea   History of Present Illness:  Rhonda Franco is a 21 y.o. female who has a PMH as outlined below including but not limited to [facility] and SLE related ILD (followed at [facility] and has had mention of transplant consideration; however, not at goal weight yet). She uses 3 L of oxygen at rest and up to 10 L with activity. She has been managed with Myfortic starting in May 2022 along with prednisone and was switched to Cytoxan. Recently, she was started on Tyvaso. Per her last notes from [facility] (dated 12/20/21), she is currently on Atovaquone Hydroxychloroquine, Mycophenolate, Prednisone.  She presented to [facility] ED 9/6 with worsening dyspnea. She apparently had her Prednisone decreased from 43m to 7.570mdaily 3 - 4 weeks ago. Since then she has had progressive dyspnea. [facility] and was given Lasix and a dose of abx.  In ED today, she was noted to have distress and hypoxia. She was placed on BiPAP which she initially declined but later agreed to. She received 4031masix and has yet to have any UOP. CXR showed bilateral ASD concerning for edema vs infection along with chronic interstitial changes with honeycombing.  She continued to have respiratory rates in the 40's and 50's; hence, PCCM called in consultation.  She is currently anxious but is tolerating BiPAP ok. She does endorse anxiety and claustrophobia and she did receive 0.5mg6mivan earlier to assist with comfort of anxious and claustrophobia.  Pertinent  Medical History:  has Pericardial effusion; Family history of diabetes mellitus; Allergic rhinitis; Acanthosis nigricans; BMI 40.0-44.9, adult (HCC)[place] discharge; High risk heterosexual behavior; Class 3 severe obesity due to excess calories without serious comorbidity with body mass index (BMI) of  40.0 to 44.9 in adult ([facility] completed; Elevated blood pressure reading without diagnosis of hypertension; Interstitial lung disease due to connective tissue disease (HCC)[place]esystemic lupus erythematosus with lung involvement (HCC)Henriettaystemic lupus erythematosus, unspecified (HCC)[place] on chronic respiratory failure with hypoxia (HCC)[place] chest pain; Acute right ventricular heart failure (HCC)[place] hypertension (HCC)Adaed blood cell antibody positive; Sleep apnea; Keloid scar; High risk social situation; Dysplasia of cervix, low grade (CIN 1); Myositis; [place] fibroma; Obesity (BMI 30-39.9); Hypoalbuminemia; Acute pulmonary edema (HCC)[place]okalemia; and Chronic right heart failure (HCC) on their problem list.   -> FVC 1.27L/38%, DLCO 7 - .o. female seen for SLE, a high-risk disease with multiple issues. SLE (2018) +ANA, +Sm, +RNP, +Ro, +dsDNA, ACA IgA 15, +coombs hemoyltic anemia, +lupus anticoagulant. pericardial effusion s/p window, ILD with fibrosis. systolic dysfunction (EF 50%)85%TX, Cellcept, Myfortic (2022-), Benlysta, HCQ. nexplanon. Cytoxan (12/202-11/2021). Rituxan 1000mg26mw 01/2202.    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 Has also had right sided heart failure. Now s/p Cytoxan, followed by recent Rituxan. Recently in OSH with increased SOB with increased edema and hypokalemia, diuretics given and fluid improved, also started on abx Levaquin. Will continue prednisone taper as this may be contributing. Once she completes antibitoics can resume Myfortic.     Significant Hospital Events: Including procedures, antibiotic start and stop dates in addition to other pertinent events   9/6 admit.  9.8 - Remained on BiPAP overnight. [place] des[place] during Tyvaso treatment. 1300 UOP overnight,  net 2.2L. Feels about the same.  - IPAL: Full code  - started milirnone empiric  - PICC  Placed  9/9 - NSVTACh this morning on 0.25 of milrinone and stopped. On 65% 25L HFNC. BiPAP Qhs with 2 application in day/ Afebrile/ Is -1.5L Negative. Coox 90 on milrinone. Unable to lie flat due to desats. Currently pulse ox 95%. Havin some nausea after food and is constipated . Mom and aunt at bedside. Levaquin changed to Azactaim  yesterday due to vaginal itch  9/10 - coox yesterday off milrinon was > 70. PCT < 0.1 STill on HHFNC just dropped to 50% fio2 and 25L [place]. 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 Some better per [facility] MD. Doing biPAp x 2 in daay and also QHS. Completed IV solumedrol. Started 62m prednisone today. Tyvasoc continues. Mom at beside. Afebrile  9/11 Feeling clinically improved, weaning HFNC  9/12 bed weight up, pt feeling edematous, able to wean to 15L HFNC yesterday  Interim History / Subjective:   No overnight events Continue to feel that her breathing is slowly improving On bipap this morning  Objective:  Blood pressure (!) 116/58, pulse 86, temperature 97.7 F (36.5 C), temperature source Axillary, resp. rate (!) 25, height _0  (1.651 m), weight 100.5 kg, SpO2 100 %.    FiO2 (%):  [40 %-50 %] 50 %   Intake/Output Summary (Last 24 hours) at 03/20/2022 0820 Last data filed at 03/20/2022 0000 Gross per 24 hour  Intake 421.33 ml  Output 425 ml  Net -3.67 ml    Filed Weights   03/18/22 0500 03/19/22 0500 03/20/22 0500  Weight: 103 kg 100.8 kg 100.5 kg      General:  overweight F, resting on bipap in no distress HEENT: MM pink/moist, sclera anicteric Neuro: awake, alert, oriented and moving all extremities CV: s1s2 rrr, no m/r/g PULM:  diffuse crackles lower lobes, no tachypnea, tolerating bipap with adequate oxygen saturations GI: soft, non-tender Extremities: warm/dry, trace pedal edema  Skin: no rashes or lesions    Assessment & Plan:   Acute on chronic hypoxic respiratory failure -  secondary to baseline pulmonary hypertension and ILD iwith   honeycombing n the setting of SLE.  High concern for ILD flare in the contex of outpatient pred tape.  Less evidence for decompensated cor pulmonale (milrinone did not help and did not tolerate), BNP nromal, Coox off milrinone normal) PLAN - Continue HHFNC to maintain O2 saturations greater than 92%, wean as able, down from 40%, 25L HFNC, mobilize OOB as tolerated - BiPAP Qh sand 2 applications x 2h each in day -Continue Prednisone 66m - Continue home Atovaquone, Hydroxychloroquine. - Continue tyvaso -  Hold home Mycophenolate for now. - Hold off RHC (on HHFNC, an also less evidence for cor pulmonale decomp) - but reconsider if needed -Repeat  Lasix today, continue Torsemide -consideration of [facility] transfer, however is improving and not a candidate for lung transplant currently until weight at goal -Pt [facility], but will res[facility]dule following discharge   Steroid induced Hyperglycemia - SSI.  Rest of plan per primary team  Best practice (evaluated daily):  Per primary team.     LABS    PULMONARY Recent Labs  Lab 03/14/22 0900 03/16/22 1930 03/17/22 0538 03/17/22 1159 03/18/22 0500  HCO3 31.5*  --   --   --   --   O2SAT 88.2 76.5 90.6 78.6 86.7     CBC  Recent Labs  Lab 03/16/22 0329 03/18/22 0500 03/19/22 0500  HGB 10.4* 9.8* 10.6*  HCT 34.4* 33.2* 35.6*  WBC 20.3* 16.8* 17.0*  PLT 498* 386 397     COAGULATION Recent Labs  Lab 03/14/22 1259  INR 1.3*     CARDIAC  No results for input(s): "TROPONINI" in the last 168 hours. No results for input(s): "PROBNP" in the last 168 hours.   CHEMISTRY Recent Labs  Lab 03/14/22 0846 03/15/22 0328 03/16/22 0329 03/17/22 0515 03/18/22 0500 03/19/22 0500 03/20/22 0500  NA 141   < > 138 137 138 138 136  K 3.5   < > 4.1 3.5 4.1 2.8* 3.3*  CL 107   < > 91* 92* 94* 89* 93*  CO2 28   < > 39* 38* 40* 41* 36*  GLUCOSE 106*   < > 114* 122* 105* 72 74  BUN 6   < > _0 CREATININE 0.48   < > 0.50 0.42* 0.40* 0.36* 0.44  CALCIUM 8.5*   < > 8.9 8.5* 8.7* 8.2* 8.6*  MG 1.9  --  1.9  --   --   --  1.7  PHOS 3.7  --  3.7  --   --   --   --    < > = values in this interval not displayed.    Estimated Creatinine Clearance: 129.6 mL/min (by C-G formula based on SCr of 0.44 mg/dL).   LIVER Recent Labs  Lab 03/14/22 0846 03/14/22 1259 03/15/22 0328  AST 20  --  21  ALT 15  --  16  ALKPHOS 55  --  60  BILITOT 0.4  --  0.5  PROT 6.7  --  7.4  ALBUMIN 2.7*  --  2.8*  INR  --  1.3*  --       INFECTIOUS Recent Labs  Lab 03/14/22 0847 03/16/22 1402 03/16/22 1930 03/17/22 0515 03/18/22 0500  LATICACIDVEN 0.9 1.3  --   --   --   PROCALCITON  --   --  0.38 0.26 <0.10      ENDOCRINE CBG (last 3)  Recent Labs    03/19/22 1946 03/19/22 2349 03/19/22 2351  GLUCAP 90 65* 78          IMAGING x48h  - image(s) personally visualized  -   highlighted in bold No results found.    Otilio Carpen Avaree Gilberti, PA-C [facility] See Amion for pager If no response to pager , please call [phone] until 7pm After 7:00 pm call Elink  [phone]?[place]

## 2022-03-21 DIAGNOSIS — J9621 Acute and chronic respiratory failure with hypoxia: Secondary | ICD-10-CM | POA: Diagnosis not present

## 2022-03-21 LAB — BASIC METABOLIC PANEL
Anion gap: 8 (ref 5–15)
BUN: 16 mg/dL (ref 6–20)
CO2: 33 mmol/L — ABNORMAL HIGH (ref 22–32)
Calcium: 8.5 mg/dL — ABNORMAL LOW (ref 8.9–10.3)
Chloride: 95 mmol/L — ABNORMAL LOW (ref 98–111)
Creatinine, Ser: 0.55 mg/dL (ref 0.44–1.00)
GFR, Estimated: >60 mL/min (ref >60)
Glucose, Bld: 81 mg/dL (ref 70–99)
Potassium: 3.1 mmol/L — ABNORMAL LOW (ref 3.5–5.1)
Sodium: 136 mmol/L (ref 135–145)

## 2022-03-21 LAB — CBC
HCT: 36.3 % (ref 36.0–46.0)
Hemoglobin: 10.9 g/dL — ABNORMAL LOW (ref 12.0–15.0)
MCH: 25.8 pg — ABNORMAL LOW (ref 26.0–34.0)
MCHC: 30 g/dL (ref 30.0–36.0)
MCV: 86 fL (ref 80.0–100.0)
Platelets: 382 10*3/uL (ref 150–400)
RBC: 4.22 MIL/uL (ref 3.87–5.11)
RDW: 16 % — ABNORMAL HIGH (ref 11.5–15.5)
WBC: 23.3 10*3/uL — ABNORMAL HIGH (ref 4.0–10.5)
nRBC: 0 % (ref 0.0–0.2)

## 2022-03-21 LAB — GLUCOSE, CAPILLARY
Glucose-Capillary: 93 mg/dL (ref 70–99)
Glucose-Capillary: 159 mg/dL — ABNORMAL HIGH (ref 70–99)
Glucose-Capillary: 157 mg/dL — ABNORMAL HIGH (ref 70–99)
Glucose-Capillary: 163 mg/dL — ABNORMAL HIGH (ref 70–99)

## 2022-03-21 LAB — MAGNESIUM: Magnesium: 2.2 mg/dL (ref 1.7–2.4)

## 2022-03-21 MED ORDER — POTASSIUM CHLORIDE CRYS ER 20 MEQ PO TBCR: 40.0000 meq | EXTENDED_RELEASE_TABLET | Freq: Once | ORAL | Status: DC

## 2022-03-21 MED ORDER — LORAZEPAM 2 MG/ML IJ SOLN
0.5000 mg | Freq: Every evening | INTRAMUSCULAR | Status: DC | PRN
  Administered 2022-03-21 – 2022-03-24 (×4): 0.5 mg via INTRAVENOUS
  Filled 2022-03-21 (×5): qty 1

## 2022-03-21 MED ORDER — ALPRAZOLAM 0.25 MG PO TABS
0.2500 mg | ORAL_TABLET | Freq: Once | ORAL | Status: AC
  Administered 2022-03-21: 0.25 mg via ORAL
  Filled 2022-03-21: qty 1

## 2022-03-21 MED ORDER — POTASSIUM CHLORIDE CRYS ER 20 MEQ PO TBCR
40.0000 meq | EXTENDED_RELEASE_TABLET | Freq: Once | ORAL | Status: AC
  Administered 2022-03-21: 40 meq via ORAL
  Filled 2022-03-21: qty 2

## 2022-03-21 NOTE — Plan of Care (Signed)
  Problem: Education: Goal: Knowledge of General Education information will improve Description: Including pain rating scale, medication(s)/side effects and non-pharmacologic comfort measures Outcome: Progressing   Problem: Health Behavior/Discharge Planning: Goal: Ability to manage health-related needs will improve Outcome: Progressing   Problem: Activity: Goal: Risk for activity intolerance will decrease Outcome: Progressing   Problem: Nutrition: Goal: Adequate nutrition will be maintained Outcome: Progressing   Problem: Pain Managment: Goal: General experience of comfort will improve Outcome: Progressing   Problem: Safety: Goal: Ability to remain free from injury will improve Outcome: Progressing   Problem: Fluid Volume: Goal: Ability to maintain a balanced intake and output will improve Outcome: Progressing   Problem: Health Behavior/Discharge Planning: Goal: Ability to identify and utilize available resources and services will improve Outcome: Progressing   Problem: Nutritional: Goal: Progress toward achieving an optimal weight will improve Outcome: Progressing

## 2022-03-21 NOTE — Progress Notes (Signed)
Physical Therapy Treatment Patient Details Name: Rhonda Franco MRN: [id number] DOB: 2001-03-04 Today's Date: 03/21/2022   History of Present Illness Patient is a 21 year old Female admitted on 03/14/22 with acute on chronic resp failure.  Pt with 2 day progressive dyspnea in the setting of recently starting Tyvaso and titrating down prednisone, now requiring >10L O2.  Pt has required HFNC and bipap during admission. Pt with systemic lupus erythematosus with related ILD, chronic respiratory failure on 3-10 LPM at home, pHTN, hx RHF, OSA and obesity and anemia    PT Comments    Pt reports fatigue but agreeable to PT.  After session, reports felt good to move.  Session focused on standing exercises for improved endurance.  Pt requiring at least 2 min rest break between bouts of activity for recovery.  She was on BiPAP (at her request).  Sats at lowest were 86% during session and HR at highest was 140 bpm - recovered well with rest.  Continue to progress as able.     Recommendations for follow up therapy are one component of a multi-disciplinary discharge planning process, led by the attending physician.  Recommendations may be updated based on patient status, additional functional criteria and insurance authorization.  Follow Up Recommendations  No PT follow up     Assistance Recommended at Discharge None  Patient can return home with the following Help with stairs or ramp for entrance;Assist for transportation;Assistance with cooking/housework   Equipment Recommendations  None recommended by PT    Recommendations for Other Services       Precautions / Restrictions Precautions Precaution Comments: monitor sats and HR     Mobility  Bed Mobility Overal bed mobility: Independent                  Transfers Overall transfer level: Needs assistance   Transfers: Sit to/from Stand Sit to Stand: Supervision           General transfer comment: Performed x 4 during session     Ambulation/Gait Ambulation/Gait assistance: Supervision Gait Distance (Feet): 5 Feet Assistive device: None Gait Pattern/deviations: WFL(Within Functional Limits)       General Gait Details: Pt on BiPAP and taking steps at EOB steadily.  Session focused on exercises for endurance.   Stairs             Wheelchair Mobility    Modified Rankin (Stroke Patients Only)       Balance Overall balance assessment: Independent                                          Cognition Arousal/Alertness: Awake/alert Behavior During Therapy: WFL for tasks assessed/performed Overall Cognitive Status: Within Functional Limits for tasks assessed                                          Exercises Other Exercises Other Exercises: Standing Marching in place (holding onto bipap machine) 45 sec and 60 sec.  With 2 min rest break between bouts Other Exercises: Mini squats 10x 2 with seated rest break between bouts    General Comments General comments (skin integrity, edema, etc.): Pt was on HHFNC earlier but requested to be placed back on BiPAP due to burning her nose, so pt on BiPAP during therapy.  Her HR 115 bpm rest and sats 100%.  With activity HR 137 bpm to 140 bpm and sats down to 86-88% at end of activity bouts (initially 92-94% activity).  Takes about 1-2 mins to recover to baseline with seated rest breaks.      Pertinent Vitals/Pain Pain Assessment Pain Assessment: No/denies pain    Home Living                          Prior Function            PT Goals (current goals can now be found in the care plan section) Progress towards PT goals: Progressing toward goals    Frequency    Min 3X/week      PT Plan Current plan remains appropriate    Co-evaluation              AM-PAC PT "6 Clicks" Mobility   Outcome Measure  Help needed turning from your back to your side while in a flat bed without using bedrails?:  None Help needed moving from lying on your back to sitting on the side of a flat bed without using bedrails?: None Help needed moving to and from a bed to a chair (including a wheelchair)?: A Little Help needed standing up from a chair using your arms (e.g., wheelchair or bedside chair)?: A Little Help needed to walk in hospital room?: A Little Help needed climbing 3-5 steps with a railing? : A Little 6 Click Score: 20    End of Session Equipment Utilized During Treatment: Oxygen Activity Tolerance: Patient tolerated treatment well Patient left: in bed;with call bell/phone within reach;with family/visitor present Nurse Communication: Mobility status PT Visit Diagnosis: Difficulty in walking, not elsewhere classified (R26.2)     Time: 5894-8347 PT Time Calculation (min) (ACUTE ONLY): 18 min  Charges:  $Therapeutic Exercise: 8-22 mins                     Abran Richard, PT Acute Rehab [facility] [phone]    Karlton Lemon 03/21/2022, 5:41 PM

## 2022-03-21 NOTE — Progress Notes (Signed)
NAME:  Rhonda Franco, MRN:  [id number], DOB:  2001/04/28, LOS: 6 ADMISSION DATE:  03/14/2022, CONSULTATION DATE:  03/14/22 REFERRING MD:  Olevia Bowens CHIEF COMPLAINT:  Dyspnea   History of Present Illness:  Rhonda Franco is a 21 y.o. female who has a PMH as outlined below including but not limited to [facility] and SLE related ILD (followed at [facility] and has had mention of transplant consideration; however, not at goal weight yet). She uses 3 L of oxygen at rest and up to 10 L with activity. She has been managed with Myfortic starting in May 2022 along with prednisone and was switched to Cytoxan. Recently, she was started on Tyvaso. Per her last notes from [facility] (dated 12/20/21), she is currently on Atovaquone Hydroxychloroquine, Mycophenolate, Prednisone.  She presented to [facility] ED 9/6 with worsening dyspnea. She apparently had her Prednisone decreased from 31m to 7.5100mdaily 3 - 4 weeks ago. Since then she has had progressive dyspnea. [facility] and was given Lasix and a dose of abx.  In ED today, she was noted to have distress and hypoxia. She was placed on BiPAP which she initially declined but later agreed to. She received 4037masix and has yet to have any UOP. CXR showed bilateral ASD concerning for edema vs infection along with chronic interstitial changes with honeycombing.  She continued to have respiratory rates in the 40's and 50's; hence, PCCM called in consultation.  She is currently anxious but is tolerating BiPAP ok. She does endorse anxiety and claustrophobia and she did receive 0.5mg76mivan earlier to assist with comfort of anxious and claustrophobia.  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 female seen for SLE, a high-risk disease with multiple issues. SLE (2018) +ANA, +Sm, +RNP, +Ro, +dsDNA, ACA IgA 15, +coombs hemoyltic anemia, +lupus anticoagulant. pericardial effusion s/p window, ILD with fibrosis. systolic dysfunction (EF 50%)72%TX, Cellcept, Myfortic (2022-), Benlysta, HCQ. nexplanon. Cytoxan (12/202-11/2021). Rituxan 1000mg47mw 01/2202.    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 Has also had right sided heart failure. Now s/p Cytoxan, followed by recent Rituxan. Recently in OSH with increased SOB with increased edema and hypokalemia, diuretics given and fluid improved, also started on abx Levaquin. Will continue prednisone taper as this may be contributing. Once she completes antibitoics can resume Myfortic.     Significant Hospital Events: Including procedures, antibiotic start and stop dates in addition to other pertinent events   9/6 admit 9.8 - Remained on BiPAP overnight. Back to [place] AM but desaturated during Tyvaso treatment. 1300 UOP overnight, net 2.2L. Feels about the same.. IPAL: Full code, started milirnone empiric. PICC Placed 9/9 - NSVTACh this morning on 0.25 of milrinone and stopped. On 65% 25L HFNC. BiPAP Qhs with 2 application in day/ Afebrile/ Is -1.5L Negative. Coox 90 on milrinone. Unable to lie flat due to desats. Currently pulse ox 95%. Havin some nausea after food and is constipated . Mom and aunt at bedside. Levaquin changed to Azactaim  yesterday due to vaginal itch 9/10 -  coox yesterday off milrinon was > 70. PCT < 0.1 STill on HHFNC just dropped to 50% fio2 and 25L [place]. 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 Some better per [facility]. Doing biPAp x 2 in daay and also QHS. Completed IV solumedrol.  Started 23m prednisone today. Tyvasoc continues. Mom at beside. Afebrile 9/11 Feeling clinically improved, weaning HFNC 9/12 bed weight up, pt feeling edematous, able to wean to 15L HFNC yesterday 9/13 No acute issues overnight remains on HFNC, mother at bedside   Interim History / Subjective:  States she feels better this am but no quite back to baseline   Objective:  Blood pressure 127/60, pulse 92, temperature 98.1 F (36.7 C), temperature source Axillary, resp. rate (!) 32, height 5' 5" (1.651 m), weight 94.9 kg, SpO2 99 %.    FiO2 (%):  [40 %] 40 %   Intake/Output Summary (Last 24 hours) at 03/21/2022 0710 Last data filed at 03/21/2022 0600 Gross per 24 hour  Intake 1501.98 ml  Output 2950 ml  Net -1448.02 ml    Filed Weights   03/19/22 0500 03/20/22 0500 03/20/22 1700  Weight: 100.8 kg 100.5 kg 94.9 kg   Physical Exam: General: Acute ill appearing adult female sitting up in bedside recliner in NAD HEENT: [place]/AT, MM pink/moist, PERRL,  Neuro: Alert and oriented x3, non-focal  CV: s1s2 regular rate and rhythm, no murmur, rubs, or gallops,  PULM:  Slightly diminished air entry bilaterally, on HFNC   GI: soft, bowel sounds active in all 4 quadrants, non-tender, non-distended, tolerating oral diet Extremities: warm/dry, non-pitting generalized edema  Skin: no rashes or lesions  Assessment & Plan:   Acute on chronic hypoxic respiratory failure -  -Secondary to baseline pulmonary hypertension and ILD iwith  honeycombing n the setting of SLE.  High concern for ILD flare in the contex of outpatient pred tape.  Less evidence for decompensated cor pulmonale (milrinone did not help and did not tolerate), BNP nromal, Coox off milrinone normal) -Can consideration of [facility] transfer, however is improving and not a candidate for lung transplant currently until weight at goal -[facility], but will reschedule following discharge P: Continue  supplemental oxygen for sat goal > 92% PRN BIPAP  Remains on 686mprednisone daily  Continue home Atovaquone, Hydroxychloroquine, and Tyvaso  Home Mycophenolate remains on hold  Continue to monitor need for continue diuresing, BID diamox was started in addition [facility] with 2.9L diuresed in the last 24hrs   Steroid induced Hyperglycemia P: Continue SSI   Rest of plan per primary team  Best practice (evaluated daily):  Per primary team.  Signature:  Kajsa Butrum D. HaKenton KingfisherNP-C [facility] Personal contact information can be found on Amion  03/21/2022, 8:44 AM

## 2022-03-21 NOTE — Progress Notes (Addendum)
Occupational Therapy Treatment Patient Details Name: Rhonda Franco MRN: [id number] DOB: 08/08/2000 Today's Date: 03/21/2022   History of present illness Patient is a 21 year old Female with SLE, SLE-related ILD, chronic respiratory failure on 3-10 LPM at home, pHTN, hx RHF, OSA and obesity and anemia who presented on 03/14/22  with 2 days progrssive dyspnea in the setting of recently starting Tyvaso and titrating down prednisone from 10 to 7.5, now requiring >10L O2.   OT comments  BUE HEP was printed and provided to patient. Patient and mother verbalized understanding. Patient was educated on being mindful of exercise on PICC line side and importance of function over exercises. Patient verbalized understanding. No further skilled OT needs at this time. OT signing off.    Recommendations for follow up therapy are one component of a multi-disciplinary discharge planning process, led by the attending physician.  Recommendations may be updated based on patient status, additional functional criteria and insurance authorization.    Follow Up Recommendations  No OT follow up    Assistance Recommended at Discharge Intermittent Supervision/Assistance  Patient can return home with the following  Assistance with cooking/housework;Assist for transportation;Help with stairs or ramp for entrance   Equipment Recommendations  None recommended by OT    Recommendations for Other Services      Precautions / Restrictions Precautions Precaution Comments: monitor sats and HR Restrictions Weight Bearing Restrictions: No       Mobility Bed Mobility Overal bed mobility: Independent                  Transfers Overall transfer level: Needs assistance   Transfers: Sit to/from Stand, Bed to chair/wheelchair/BSC Sit to Stand: Supervision     Step pivot transfers: Supervision     General transfer comment: education on usign non skid material on feet when OOB and safety with lines      Balance Overall balance assessment: Independent                                         ADL either performed or assessed with clinical judgement   ADL Overall ADL's : Modified independent                                       General ADL Comments: patient is able to complete ADLs at bed side within limits of HFNC. patient and mother agreed that patient is at baseline for ADLs at this time. patient inquired about BUE exercises for maintaining strength while in the hospital. one to be created for patient with orange theraband.    Extremity/Trunk Assessment Upper Extremity Assessment Upper Extremity Assessment: Overall WFL for tasks assessed   Lower Extremity Assessment Lower Extremity Assessment: Defer to PT evaluation   Cervical / Trunk Assessment Cervical / Trunk Assessment: Normal    Vision Baseline Vision/History: 1 Wears glasses     Perception     Praxis      Cognition Arousal/Alertness: Awake/alert Behavior During Therapy: WFL for tasks assessed/performed Overall Cognitive Status: Within Functional Limits for tasks assessed                                          Exercises Other Exercises Other  Exercises: HEP was created and print out was brought to patient. patient was educated on using orange theraband on bed to particiapte in bicep/tricep exercises with education on being cautious of exercises on UE with PICC line. patient vebralized and demonstrated understanding. patient HEP included elbow flexion, extension, shoulder abduction and FF. patient demontrated ability to complete a few reps of each task. patient HEP had directions on diaphragmatic breathing as well. patient verbalized understanding. patient was extensively educated on this being in addition to function and that if there was discomfort or shoulder pain then to back off exercises and focus on functional tasks. patient and patients mother verbalized  understanding. Patient as also educated on importance of monitoring theraband for tears or weakness and to transition to new band if any tears form. Patient verbalized understanding.     Shoulder Instructions       General Comments patient was on heated high flow during session 25L on 40% FIO2. patient was noted to drop to 82% with transfer and toleting tasks with patietna ble to deep breath stats back up. HR noted to reach 145 bpm during session.    Pertinent Vitals/ Pain       Pain Assessment Pain Assessment: No/denies pain  Home Living Family/patient expects to be discharged to:: Private residence Living Arrangements: Parent Available Help at Discharge: Family;Available 24 hours/day Type of Home: Mobile home Home Access: Stairs to enter Entrance Stairs-Number of Steps: 4 Entrance Stairs-Rails: Right;Left Home Layout: One level     Bathroom Shower/Tub: Tub/shower unit         Home Equipment: None          Prior Functioning/Environment              Frequency           Progress Toward Goals  OT Goals(current goals can now be found in the care plan section)  Progress towards OT goals: Goals met/education completed, patient discharged from OT  Acute Rehab OT Goals Patient Stated Goal: to get HEP for BUE OT Goal Formulation: With patient/family Time For Goal Achievement: 04/04/22 Potential to Achieve Goals: [place] Discharge plan remains appropriate    Co-evaluation                 AM-PAC OT "6 Clicks" Daily Activity     Outcome Measure   Help from another person eating meals?: None Help from another person taking care of personal grooming?: None Help from another person toileting, which includes using toliet, bedpan, or urinal?: None Help from another person bathing (including washing, rinsing, drying)?: None Help from another person to put on and taking off regular upper body clothing?: None Help from another person to put on and taking  off regular lower body clothing?: None 6 Click Score: 24    End of Session Equipment Utilized During Treatment: Other (comment) (heated high flow.)  OT Visit Diagnosis: Unsteadiness on feet (R26.81)   Activity Tolerance Patient tolerated treatment well   Patient Left in bed;with call bell/phone within reach;with family/visitor present   Nurse Communication Mobility status        Time: 7322-0254 OT Time Calculation (min): 8 min  Charges: OT General Charges $OT Visit: 1 Visit OT Evaluation $OT Eval Low Complexity: 1 Low OT Treatments $Self Care/Home Management : 8-22 mins $Therapeutic Activity: 8-22 mins  [name], MS Acute Rehabilitation Department Office# [phone]   [name] 03/21/2022, 11:02 AM

## 2022-03-21 NOTE — Progress Notes (Signed)
PROGRESS NOTE  Rhonda Franco SEG:[id number] DOB: Apr 22, 2001 DOA: 03/14/2022 PCP: Pcp, No   LOS: 6 days   Brief Narrative / Interim history: Rhonda Franco is a 21 y.o. F with PAH, SLE, SLE-related ILD, chronic respiratory failure on 2-3L at home at rest and up to 10 L with activities, hx RHF, OSA and obesity and anemia who presented with 2 days progrssive dyspnea in the setting of recently starting Tyvaso and titrating down prednisone from 10 to 7.5.  She was requiring BiPAP and heated high flow nasal cannula, was admitted to stepdown and pulmonary was consulted.  On admission chest x-ray showed bilateral opacities.  Significant events: 9/6: Admitted on Lasix, IV steroids and BiPAP, pulm consulted 9/7: 40L HFNC 9/8: Switched to Azactam, PICC placed, milrinone started, on 25L HFNC 9/10: No change; milrinone stopped 9/12: On 15L  Significant imaging / results / micro data: Chest x-ray 9/6-worsening airspace disease in both lungs, nonspecific infection versus pulmonary edema.  Prominent main pulmonary artery compatible pulmonary arterial hypertension Chest x-ray 9/10-bilateral airspace disease with mild improvement left upper to midlung field.  No new or worsening infiltrates 2D echo 9/7-LVEF 55-60%, no WMA.  Indeterminate diastolic parameters.  RV systolic function was moderately reduced, tricuspid regurgitation signal is inadequate for assessing PA pressure.  Subjective / 24h Interval events: She appears comfortable, BiPAP is on.  States that she rested well.  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  Suspect ILD flare and pulmonary was consulted.  She is followed at [facility] with consideration for lung transplant, however she is not at goal weight.  She recently had prednisone decreased from 10 mg to 7 mg daily, and since then she has had progressive dyspnea.  There was a concern about decompensated right heart failure, Rhonda Franco discussed with cardiology and patient was given a trial of milrinone, which did not help.  Milrinone has now been discontinued.  She has completed 3 days of high-dose Solu-Medrol as well as 5 days of antibiotics. -Continue home atovaquone, hydroxychloroquine, treprostinil and prednisone.  Home mycophenolate is on hold.   Active problems Chronic right heart failure (HCC) -No pitting edema.  Case discussed with Adv HF by Rhonda Franco team and underwent trial milrinone but no improvement in ScvO2.  Currently on torsemide.  Closely monitor ins and outs, daily weights   Hypokalemia -Persistent.  Mag normal.  Continue to replete and monitor   Hypoalbuminemia -Complicates diuresis   Obesity (BMI 30-39.9) -BMI 34   Sleep apnea -Continue BiPAP at night   Pulmonary hypertension (HCC) -Types 1 and 3 PAH. Continue Tyvaso   Systemic lupus erythematosus with lung involvement ([place]) -Stopped Myfortic 2 months ago.  Is tapering down from prednisone, just prior to onset of symptoms, had gone from 10 mg daily to 7.5.  G6PD normal.       Scheduled Meds:  acetaZOLAMIDE  250 mg Oral BID   aspirin EC  81 mg Oral Daily   atovaquone  1,500 mg Oral Q breakfast   Chlorhexidine Gluconate Cloth  6 each Topical Daily   enoxaparin (LOVENOX) injection  50 mg  Subcutaneous Daily   famotidine  20 mg Oral BID   hydroxychloroquine  200 mg Oral BID   insulin aspart  0-9 Units Subcutaneous TID WC   mouth rinse  15 mL Mouth Rinse 4 times per day   polyethylene glycol  17 g Oral Daily   potassium chloride  40 mEq Oral Once   predniSONE  60 mg Oral Q  breakfast   sodium chloride flush  10-40 mL Intracatheter Q12H   torsemide  20 mg Oral BID   Treprostinil  72 mcg Inhalation Q4H WA   Vitamin D (Ergocalciferol)  50,000 Units Oral Weekly   Continuous Infusions:  sodium chloride Stopped (03/19/22 1542)   PRN Meds:.sodium chloride, acetaminophen **OR** acetaminophen, alum & mag hydroxide-simeth, levalbuterol, LORazepam, ondansetron **OR** ondansetron (ZOFRAN) IV, mouth rinse, oxyCODONE, polyethylene glycol, senna-docusate, sodium chloride, sodium chloride flush  Current Outpatient Medications  Medication Instructions   acetaZOLAMIDE (DIAMOX) 250 mg, Oral, 2 times daily   aspirin EC 81 mg, Oral, Daily   ergocalciferol (VITAMIN D2) 50,000 Units, Oral, Weekly   famotidine (PEPCID) 20 mg, Oral, 2 times daily   hydroxychloroquine (PLAQUENIL) 200 mg, Oral, 2 times daily   potassium chloride SA (KLOR-CON M) 20 MEQ tablet 20 mEq, Oral, 3 times daily   predniSONE (DELTASONE) 2.5 mg, Oral, 3 times daily   torsemide (DEMADEX) 20 mg, Oral, 2 times daily   Treprostinil (TYVASO IN) 12 puffs, Inhalation, 4 times daily    Diet Orders (From admission, onward)     Start     Ordered   03/14/22 1127  Diet Carb Modified Fluid consistency: Thin; Room service appropriate? Yes  Diet effective now       Question Answer Comment  Diet-HS Snack? Nothing   Calorie Level Medium 1600-2000   Fluid consistency: Thin   Room service appropriate? Yes      03/14/22 1126            DVT prophylaxis:    Lab Results  Component Value Date   PLT 382 03/21/2022      Code Status: Full Code  Family Communication: No family at bedside  Status is: Inpatient Remains inpatient appropriate because: severity of illness  Level of care: Stepdown  Consultants:  PCCM  Objective: Vitals:   03/21/22 0000 03/21/22 0400 03/21/22 0500 03/21/22 0600  BP: (!) 140/81 129/64 124/76 127/60  Pulse: 95 (!) 104 94 92  Resp: (!) 34 (!) 33 (!) 25 (!) 32  Temp: 98.3 F  (36.8 C) 98.1 F (36.7 C)    TempSrc: Axillary Axillary    SpO2: 98% 98% 98% 99%  Weight:      Height:        Intake/Output Summary (Last 24 hours) at 03/21/2022 0658 Last data filed at 03/21/2022 0600 Gross per 24 hour  Intake 1501.98 ml  Output 2950 ml  Net -1448.02 ml   Wt Readings from Last 3 Encounters:  03/20/22 94.9 kg  11/13/21 97.1 kg  10/16/21 107 kg    Examination:  Constitutional: NAD Eyes: no scleral icterus ENMT: Mucous membranes are moist.  Neck: normal, supple Respiratory: clear to auscultation bilaterally, no wheezing, no crackles. Normal respiratory effort. No accessory muscle use.  Cardiovascular: Regular rate and rhythm, no murmurs / rubs / gallops. No LE edema.  Abdomen: non distended, no tenderness. Bowel sounds positive.  Musculoskeletal: no clubbing / cyanosis.  Skin: no rashes Neurologic: non focal   Data Reviewed: I have independently reviewed following labs and imaging studies   CBC  Recent Labs  Lab 03/14/22 0846 03/15/22 0328 03/15/22 1023 03/16/22 0329 03/18/22 0500 03/19/22 0500 03/21/22 0545  WBC 21.0* 24.4*  --  20.3* 16.8* 17.0* 23.3*  HGB 10.9* 11.1* 11.8 10.4* 9.8* 10.6* 10.9*  HCT 37.2 37.3  --  34.4* 33.2* 35.6* 36.3  PLT 495* 494*  --  498* 386 397 382  MCV 86.7 85.6  --  85.1 87.4 85.6 86.0  MCH 25.4* 25.5*  --  25.7* 25.8* 25.5* 25.8*  MCHC 29.3* 29.8*  --  30.2 29.5* 29.8* 30.0  RDW 14.6 14.6  --  14.5 14.8 14.9 16.0*  LYMPHSABS 0.7  --   --   --   --   --   --   MONOABS 1.5*  --   --   --   --   --   --   EOSABS 0.5  --   --   --   --   --   --   BASOSABS 0.1  --   --   --   --   --   --     Recent Labs  Lab 03/14/22 0846 03/14/22 0847 03/14/22 1259 03/15/22 0328 03/16/22 0329 03/16/22 1402 03/16/22 1930 03/17/22 0515 03/18/22 0500 03/19/22 0500 03/20/22 0500 03/20/22 1418 03/21/22 0545  NA 141  --   --  143 138  --   --  137 138 138 136  --  136  K 3.5  --   --  4.1 4.1  --   --  3.5 4.1 2.8* 3.3*  2.9* 3.1*  CL 107  --   --  99 91*  --   --  92* 94* 89* 93*  --  95*  CO2 28  --   --  35* 39*  --   --  38* 40* 41* 36*  --  33*  GLUCOSE 106*  --   --  115* 114*  --   --  122* 105* 72 74  --  81  BUN 6  --   --  8 13  --   --  _0 --  16  CREATININE 0.48  --   --  0.46 0.50  --   --  0.42* 0.40* 0.36* 0.44  --  0.55  CALCIUM 8.5*  --   --  9.1 8.9  --   --  8.5* 8.7* 8.2* 8.6*  --  8.5*  AST 20  --   --  21  --   --   --   --   --   --   --   --   --   ALT 15  --   --  16  --   --   --   --   --   --   --   --   --   ALKPHOS 55  --   --  60  --   --   --   --   --   --   --   --   --   BILITOT 0.4  --   --  0.5  --   --   --   --   --   --   --   --   --   ALBUMIN 2.7*  --   --  2.8*  --   --   --   --   --   --   --   --   --  MG 1.9  --   --   --  1.9  --   --   --   --   --  1.7  --  2.2  CRP  --   --  13.7*  --   --   --   --   --   --   --   --   --   --   PROCALCITON  --   --   --   --   --   --  0.38 0.26 <0.10  --   --   --   --   LATICACIDVEN  --  0.9  --   --   --  1.3  --   --   --   --   --   --   --   INR  --   --  1.3*  --   --   --   --   --   --   --   --   --   --   HGBA1C  --   --  5.7*  --   --   --   --   --   --   --   --   --   --   BNP 307.6*  --   --   --   --  61.1  --  42.7  --   --   --   --   --     ------------------------------------------------------------------------------------------------------------------ No results for input(s): "CHOL", "HDL", "LDLCALC", "TRIG", "CHOLHDL", "LDLDIRECT" in the last 72 hours.  Lab Results  Component Value Date   HGBA1C 5.7 (H) 03/14/2022   ------------------------------------------------------------------------------------------------------------------ No results for input(s): "TSH", "T4TOTAL", "T3FREE", "THYROIDAB" in the last 72 hours.  Invalid input(s): "FREET3"  Cardiac Enzymes No results for input(s): "CKMB", "TROPONINI", "MYOGLOBIN" in the last 168 hours.  Invalid input(s):  "CK" ------------------------------------------------------------------------------------------------------------------    Component Value Date/Time   BNP 42.7 03/17/2022 0515    CBG: Recent Labs  Lab 03/19/22 2351 03/20/22 0742 03/20/22 1117 03/20/22 1628 03/20/22 2121  GLUCAP 78 71 87 159* 180*    Recent Results (from the past 240 hour(s))  Resp Panel by RT-PCR (Flu A&B, Covid) Anterior Nasal Swab     Status: None   Collection Time: 03/14/22  8:48 AM   Specimen: Anterior Nasal Swab  Result Value Ref Range Status   SARS Coronavirus 2 by RT PCR NEGATIVE NEGATIVE Final    Comment: (NOTE) SARS-CoV-2 target nucleic acids are NOT DETECTED.  The SARS-CoV-2 RNA is generally detectable in upper respiratory specimens during the acute phase of infection. The lowest concentration of SARS-CoV-2 viral copies this assay can detect is 138 copies/mL. A negative result does not preclude SARS-Cov-2 infection and should not be used as the sole basis for treatment or other patient management decisions. A negative result may occur with  improper specimen collection/handling, submission of specimen other than nasopharyngeal swab, presence of viral mutation(s) within the areas targeted by this assay, and inadequate number of viral copies(<138 copies/mL). A negative result must be combined with clinical observations, patient history, and epidemiological information. The expected result is Negative.  Fact Sheet for Patients:  EntrepreneurPulse.com.au  Fact Sheet for Healthcare Providers:  IncredibleEmployment.be  This test is no t yet approved or cleared by the [place] FDA and  has been authorized for detection and/or diagnosis of SARS-CoV-2 by FDA under an Emergency Use Authorization (EUA). This EUA will remain  in  effect (meaning this test can be used) for the duration of the COVID-19 declaration under Section 564(b)(1) of the Act,  21 U.S.C.section 360bbb-3(b)(1), unless the authorization is terminated  or revoked sooner.       Influenza A by PCR NEGATIVE NEGATIVE Final   Influenza B by PCR NEGATIVE NEGATIVE Final    Comment: (NOTE) The Xpert Xpress SARS-CoV-2/FLU/RSV plus assay is intended as an aid in the diagnosis of influenza from Nasopharyngeal swab specimens and should not be used as a sole basis for treatment. Nasal washings and aspirates are unacceptable for Xpert Xpress SARS-CoV-2/FLU/RSV testing.  Fact Sheet for Patients: EntrepreneurPulse.com.au  Fact Sheet for Healthcare Providers: IncredibleEmployment.be  This test is not yet approved or cleared by the [place] FDA and has been authorized for detection and/or diagnosis of SARS-CoV-2 by FDA under an Emergency Use Authorization (EUA). This EUA will remain in effect (meaning this test can be used) for the duration of the COVID-19 declaration under Section 564(b)(1) of the Act, 21 U.S.C. section 360bbb-3(b)(1), unless the authorization is terminated or revoked.  Performed at [facility], [place] [address] [place], [place] [address]   Wet prep, genital     Status: None   Collection Time: 03/15/22  8:16 PM   Specimen: Vaginal  Result Value Ref Range Status   Yeast Wet Prep HPF POC NONE SEEN NONE SEEN Final   Trich, Wet Prep NONE SEEN NONE SEEN Final   Clue Cells Wet Prep HPF POC NONE SEEN NONE SEEN Final   WBC, Wet Prep HPF POC <10 <10 Final   Sperm NONE SEEN  Final    Comment: Performed at [facility], [place] [address] [place], [place] [address]  Respiratory (~20 pathogens) panel by PCR     Status: None   Collection Time: 03/16/22  4:00 PM   Specimen: Nasopharyngeal Swab; Respiratory  Result Value Ref Range Status   Adenovirus NOT DETECTED NOT DETECTED Final   Coronavirus 229E NOT DETECTED NOT DETECTED Final    Comment: (NOTE) The Coronavirus on the Respiratory  Panel, DOES NOT test for the novel  Coronavirus (2019 nCoV)    Coronavirus HKU1 NOT DETECTED NOT DETECTED Final   Coronavirus NL63 NOT DETECTED NOT DETECTED Final   Coronavirus OC43 NOT DETECTED NOT DETECTED Final   Metapneumovirus NOT DETECTED NOT DETECTED Final   Rhinovirus / Enterovirus NOT DETECTED NOT DETECTED Final   Influenza A NOT DETECTED NOT DETECTED Final   Influenza B NOT DETECTED NOT DETECTED Final   Parainfluenza Virus 1 NOT DETECTED NOT DETECTED Final   Parainfluenza Virus 2 NOT DETECTED NOT DETECTED Final   Parainfluenza Virus 3 NOT DETECTED NOT DETECTED Final   Parainfluenza Virus 4 NOT DETECTED NOT DETECTED Final   Respiratory Syncytial Virus NOT DETECTED NOT DETECTED Final   Bordetella pertussis NOT DETECTED NOT DETECTED Final   Bordetella Parapertussis NOT DETECTED NOT DETECTED Final   Chlamydophila pneumoniae NOT DETECTED NOT DETECTED Final   Mycoplasma pneumoniae NOT DETECTED NOT DETECTED Final    Comment: Performed at [facility], [place]. [address] [place], [place] [address]     Radiology Studies: No results found.   Marzetta Board, MD, PhD [facility]  Between 7 am - 7 pm I am available, please contact me via Amion (for emergencies) or Securechat (non urgent messages)  Between 7 pm - 7 am I am not available, please contact night coverage MD/APP via Amion

## 2022-03-21 NOTE — Evaluation (Signed)
Occupational Therapy Evaluation Patient Details Name: Rhonda Franco MRN: [id number] DOB: Jun 24, 2001 Today's Date: 03/21/2022   History of Present Illness Patient is a 21 year old Female with SLE, SLE-related ILD, chronic respiratory failure on 3-10 LPM at home, pHTN, hx RHF, OSA and obesity and anemia who presented on 03/14/22  with 2 days progrssive dyspnea in the setting of recently starting Tyvaso and titrating down prednisone from 10 to 7.5, now requiring >10L O2.   Clinical Impression   Patient is a 21 year old female who was admitted for above. Patient is currently on 25L/min FiO2 40% heated high flow. Patient was noted to drop to 84% with few minutes with education on deep breathing to deep breath back up. Patient and mother were educated on diaphragmatic breathing strategies. Patient verbalized understanding and reported she would continue to practice after session. Patient is at baseline for ADLs at this time. Patient requested for HEP to be created for BUE. OT to sign off after creation and implementation of HEP.      Recommendations for follow up therapy are one component of a multi-disciplinary discharge planning process, led by the attending physician.  Recommendations may be updated based on patient status, additional functional criteria and insurance authorization.   Follow Up Recommendations  No OT follow up    Assistance Recommended at Discharge Intermittent Supervision/Assistance  Patient can return home with the following Assistance with cooking/housework;Assist for transportation;Help with stairs or ramp for entrance    Functional Status Assessment  Patient has not had a recent decline in their functional status  Equipment Recommendations  None recommended by OT    Recommendations for Other Services       Precautions / Restrictions Precautions Precaution Comments: monitor sats and HR Restrictions Weight Bearing Restrictions: No      Mobility Bed  Mobility Overal bed mobility: Independent                  Transfers Overall transfer level: Needs assistance   Transfers: Sit to/from Stand, Bed to chair/wheelchair/BSC Sit to Stand: Supervision     Step pivot transfers: Supervision     General transfer comment: education on usign non skid material on feet when OOB and safety with lines      Balance Overall balance assessment: Independent                                         ADL either performed or assessed with clinical judgement   ADL Overall ADL's : Modified independent                                       General ADL Comments: patient is able to complete ADLs at bed side within limits of HFNC. patient and mother agreed that patient is at baseline for ADLs at this time. patient inquired about BUE exercises for maintaining strength while in the hospital. one to be created for patient with orange theraband.     Vision Baseline Vision/History: 1 Wears glasses       Perception     Praxis      Pertinent Vitals/Pain Pain Assessment Pain Assessment: No/denies pain     Hand Dominance Right   Extremity/Trunk Assessment Upper Extremity Assessment Upper Extremity Assessment: Overall WFL for tasks assessed   Lower Extremity Assessment  Lower Extremity Assessment: Defer to PT evaluation   Cervical / Trunk Assessment Cervical / Trunk Assessment: Normal   Communication Communication Communication: No difficulties   Cognition Arousal/Alertness: Awake/alert Behavior During Therapy: WFL for tasks assessed/performed Overall Cognitive Status: Within Functional Limits for tasks assessed                                       General Comments  patient was on heated high flow during session 25L on 40% FIO2. patient was noted to drop to 82% with transfer and toleting tasks with patietna ble to deep breath stats back up. HR noted to reach 145 bpm during session.     Exercises     Shoulder Instructions      Home Living Family/patient expects to be discharged to:: Private residence Living Arrangements: Parent Available Help at Discharge: Family;Available 24 hours/day Type of Home: Mobile home Home Access: Stairs to enter Entrance Stairs-Number of Steps: 4 Entrance Stairs-Rails: Right;Left Home Layout: One level     Bathroom Shower/Tub: Tub/shower unit         Home Equipment: None          Prior Functioning/Environment Prior Level of Function : Independent/Modified Independent             Mobility Comments: on O2          OT Problem List: Decreased strength      OT Treatment/Interventions:      OT Goals(Current goals can be found in the care plan section) Acute Rehab OT Goals Patient Stated Goal: to get HEP for BUE OT Goal Formulation: With patient/family Time For Goal Achievement: 04/04/22 Potential to Achieve Goals: Fair  OT Frequency:      Co-evaluation              AM-PAC OT "6 Clicks" Daily Activity     Outcome Measure Help from another person eating meals?: None Help from another person taking care of personal grooming?: None Help from another person toileting, which includes using toliet, bedpan, or urinal?: None Help from another person bathing (including washing, rinsing, drying)?: None Help from another person to put on and taking off regular upper body clothing?: None Help from another person to put on and taking off regular lower body clothing?: None 6 Click Score: 24   End of Session Equipment Utilized During Treatment: Other (comment) (heated high flow) Nurse Communication: Other (comment) (HR during session and HEP)  Activity Tolerance: Patient tolerated treatment well Patient left: in bed;with call bell/phone within reach;with family/visitor present  OT Visit Diagnosis: Unsteadiness on feet (R26.81)                Time: 8527-7824 OT Time Calculation (min): 24 min Charges:  OT General  Charges $OT Visit: 1 Visit OT Evaluation $OT Eval Low Complexity: 1 Low OT Treatments $Self Care/Home Management : 8-22 mins  [name], MS Acute Rehabilitation Department Office# [phone]   [name] 03/21/2022, 10:54 AM

## 2022-03-22 DIAGNOSIS — J810 Acute pulmonary edema: Secondary | ICD-10-CM | POA: Diagnosis not present

## 2022-03-22 DIAGNOSIS — J9621 Acute and chronic respiratory failure with hypoxia: Secondary | ICD-10-CM | POA: Diagnosis not present

## 2022-03-22 LAB — COMPREHENSIVE METABOLIC PANEL
ALT: 12 U/L (ref 0–44)
AST: 14 U/L — ABNORMAL LOW (ref 15–41)
Albumin: 2.8 g/dL — ABNORMAL LOW (ref 3.5–5.0)
Alkaline Phosphatase: 39 U/L (ref 38–126)
Anion gap: 6 (ref 5–15)
BUN: 13 mg/dL (ref 6–20)
CO2: 34 mmol/L — ABNORMAL HIGH (ref 22–32)
Calcium: 8.4 mg/dL — ABNORMAL LOW (ref 8.9–10.3)
Chloride: 96 mmol/L — ABNORMAL LOW (ref 98–111)
Creatinine, Ser: 0.5 mg/dL (ref 0.44–1.00)
GFR, Estimated: >60 mL/min (ref >60)
Glucose, Bld: 83 mg/dL (ref 70–99)
Potassium: 2.9 mmol/L — ABNORMAL LOW (ref 3.5–5.1)
Sodium: 136 mmol/L (ref 135–145)
Total Bilirubin: 0.4 mg/dL (ref 0.3–1.2)
Total Protein: 5.9 g/dL — ABNORMAL LOW (ref 6.5–8.1)

## 2022-03-22 LAB — GLUCOSE, CAPILLARY
Glucose-Capillary: 79 mg/dL (ref 70–99)
Glucose-Capillary: 112 mg/dL — ABNORMAL HIGH (ref 70–99)

## 2022-03-22 LAB — CBC
HCT: 36 % (ref 36.0–46.0)
Hemoglobin: 10.8 g/dL — ABNORMAL LOW (ref 12.0–15.0)
MCH: 25.7 pg — ABNORMAL LOW (ref 26.0–34.0)
MCHC: 30 g/dL (ref 30.0–36.0)
MCV: 85.5 fL (ref 80.0–100.0)
Platelets: 350 10*3/uL (ref 150–400)
RBC: 4.21 MIL/uL (ref 3.87–5.11)
RDW: 15.9 % — ABNORMAL HIGH (ref 11.5–15.5)
WBC: 19 10*3/uL — ABNORMAL HIGH (ref 4.0–10.5)
nRBC: 0 % (ref 0.0–0.2)

## 2022-03-22 LAB — MAGNESIUM: Magnesium: 1.9 mg/dL (ref 1.7–2.4)

## 2022-03-22 LAB — PHOSPHORUS: Phosphorus: 3.5 mg/dL (ref 2.5–4.6)

## 2022-03-22 MED ORDER — MAGNESIUM SULFATE IN D5W 1-5 GM/100ML-% IV SOLN
1.0000 g | Freq: Once | INTRAVENOUS | Status: AC
  Administered 2022-03-22: 1 g via INTRAVENOUS
  Filled 2022-03-22: qty 100

## 2022-03-22 MED ORDER — POTASSIUM CHLORIDE CRYS ER 20 MEQ PO TBCR
40.0000 meq | EXTENDED_RELEASE_TABLET | Freq: Once | ORAL | Status: AC
  Administered 2022-03-22: 40 meq via ORAL
  Filled 2022-03-22: qty 2

## 2022-03-22 MED ORDER — TREPROSTINIL 0.6 MG/ML IN SOLN
72.0000 ug | Freq: Four times a day (QID) | RESPIRATORY_TRACT | Status: DC
  Administered 2022-03-23 – 2022-03-26 (×15): 72 ug via RESPIRATORY_TRACT
  Filled 2022-03-22 (×5): qty 2.9

## 2022-03-22 MED ORDER — POTASSIUM CHLORIDE 10 MEQ/50ML IV SOLN
10.0000 meq | INTRAVENOUS | Status: AC
  Administered 2022-03-22 (×4): 10 meq via INTRAVENOUS
  Filled 2022-03-22 (×4): qty 50

## 2022-03-22 NOTE — Progress Notes (Signed)
PROGRESS NOTE  Rhonda Franco UVO:[id number] DOB: 12-31-2000 DOA: 03/14/2022 PCP: Pcp, No   LOS: 7 days   Brief Narrative / Interim history: Rhonda Franco is a 21 y.o. F with PAH, SLE, SLE-related ILD, chronic respiratory failure on 2-3L at home at rest and up to 10 L with activities, hx RHF, OSA and obesity and anemia who presented with 2 days progrssive dyspnea in the setting of recently starting Tyvaso and titrating down prednisone from 10 to 7.5.  She was requiring BiPAP and heated high flow nasal cannula, was admitted to stepdown and pulmonary was consulted.  On admission chest x-ray showed bilateral opacities.  Significant events: 9/6: Admitted on Lasix, IV steroids and BiPAP, pulm consulted 9/7: 40L HFNC 9/8: Switched to Azactam, PICC placed, milrinone started, on 25L HFNC 9/10: No change; milrinone stopped 9/12: On 15L  Significant imaging / results / micro data: Chest x-ray 9/6-worsening airspace disease in both lungs, nonspecific infection versus pulmonary edema.  Prominent main pulmonary artery compatible pulmonary arterial hypertension Chest x-ray 9/10-bilateral airspace disease with mild improvement left upper to midlung field.  No new or worsening infiltrates 2D echo 9/7-LVEF 55-60%, no WMA.  Indeterminate diastolic parameters.  RV systolic function was moderately reduced, tricuspid regurgitation signal is inadequate for assessing PA pressure.  Subjective / 24h Interval events: She appears comfortable, BiPAP is on.  States that she rested well.  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  Suspect ILD flare and pulmonary was consulted.  She is followed at [facility] with consideration for lung transplant, however she is not at goal weight.  She recently had prednisone decreased from 10 mg to 7 mg daily, and since then she has had progressive dyspnea.  There was a concern about decompensated right heart failure, Dr. Loleta Books discussed with cardiology and patient was given a trial of milrinone, which did not help.  Milrinone has now been discontinued.  She has completed 3 days of high-dose Solu-Medrol as well as 5 days of antibiotics. -Continue home atovaquone, hydroxychloroquine, treprostinil and prednisone.  Home mycophenolate is on hold. -Remains profoundly hypoxic currently on 25 L, wean off as tolerated.  Sats look good this morning   Active problems Chronic right heart failure (HCC) -No pitting edema.  Case discussed with Adv HF by Dr. Loleta Books team and underwent trial milrinone but no improvement in ScvO2.  Currently on torsemide.  Closely monitor ins and outs, daily weights.  Net -3.9 L, weight 209 pounds   Hypokalemia -Persistent.  Mag normal.  Continue to replete and monitor   Hypoalbuminemia -Complicates diuresis   Obesity (BMI 30-39.9) -BMI 34   Sleep apnea -Continue BiPAP at night   Pulmonary hypertension (HCC) -Types 1 and 3 PAH. Continue Tyvaso   Systemic lupus erythematosus with lung involvement ([place]) -Stopped Myfortic 2 months ago.  Is tapering down from prednisone, just prior to onset of symptoms, had gone from 10 mg daily to 7.5.  G6PD normal.       Scheduled Meds:  acetaZOLAMIDE  250 mg Oral BID   aspirin EC  81 mg Oral Daily   atovaquone  1,500 mg Oral Q breakfast   Chlorhexidine Gluconate Cloth  6 each Topical Daily   enoxaparin (LOVENOX) injection  50 mg Subcutaneous Daily   famotidine  20 mg Oral BID   hydroxychloroquine  200 mg Oral BID   insulin aspart  0-9 Units Subcutaneous TID WC   mouth rinse  15 mL  Mouth Rinse 4 times per day   polyethylene glycol  17 g Oral Daily   predniSONE  60 mg Oral Q breakfast   sodium chloride flush  10-40 mL Intracatheter Q12H   torsemide  20 mg Oral BID   Treprostinil  72 mcg Inhalation Q4H WA   Vitamin D (Ergocalciferol)  50,000 Units Oral Weekly   Continuous Infusions:  sodium chloride Stopped (03/19/22 1542)   potassium chloride 10 mEq (03/22/22 1018)   PRN Meds:.sodium chloride, acetaminophen **OR** acetaminophen, alum & mag hydroxide-simeth, levalbuterol, LORazepam, ondansetron **OR** ondansetron (ZOFRAN) IV, mouth rinse, oxyCODONE, polyethylene glycol, senna-docusate, sodium chloride, sodium chloride flush  Current Outpatient Medications  Medication Instructions   acetaZOLAMIDE (DIAMOX) 250 mg, Oral, 2 times daily   aspirin EC 81 mg, Oral, Daily   ergocalciferol (VITAMIN D2) 50,000 Units, Oral, Weekly   famotidine (PEPCID) 20 mg, Oral, 2 times daily   hydroxychloroquine (PLAQUENIL) 200 mg, Oral, 2 times daily   potassium chloride SA (KLOR-CON M) 20 MEQ tablet 20 mEq, Oral, 3 times daily   predniSONE (DELTASONE) 2.5 mg, Oral, 3 times daily   torsemide (DEMADEX) 20 mg, Oral, 2 times daily   Treprostinil (TYVASO IN) 12 puffs, Inhalation, 4 times daily    Diet Orders (From admission, onward)     Start     Ordered   03/14/22 1127  Diet Carb Modified Fluid consistency: Thin; Room service appropriate? Yes  Diet effective now       Question Answer Comment  Diet-HS Snack? Nothing   Calorie Level Medium 1600-2000   Fluid consistency: Thin   Room service appropriate? Yes      03/14/22 1126            DVT prophylaxis: Lovenox   Lab Results  Component Value Date   PLT 350 03/22/2022      Code Status: Full Code  Family Communication: Mother present at bedside  Status is: Inpatient Remains inpatient appropriate because: severity of illness  Level of care: Stepdown  Consultants:  PCCM  Objective: Vitals:   03/22/22 0500  03/22/22 0600 03/22/22 0800 03/22/22 0900  BP: 109/72 117/71    Pulse: 92 83    Resp: (!) 32 (!) 31    Temp:   (!) 96.8 F (36 C)   TempSrc:   Axillary   SpO2: 98% 97%  94%  Weight:      Height:        Intake/Output Summary (Last 24 hours) at 03/22/2022 1056 Last data filed at 03/22/2022 6789 Gross per 24 hour  Intake 425.85 ml  Output --  Net 425.85 ml    Wt Readings from Last 3 Encounters:  03/20/22 94.9 kg  11/13/21 97.1 kg  10/16/21 107 kg    Examination:  Constitutional: NAD Eyes: lids and conjunctivae normal, no scleral icterus ENMT: mmm Neck: normal, supple Respiratory: Faint rhonchi bilaterally, no wheezing, no crackles.  Tachypneic at times Cardiovascular: Regular rate and rhythm, no murmurs / rubs / gallops. No LE edema. Abdomen: soft, no distention, no tenderness. Bowel sounds positive.  Skin: no rashes Neurologic: no focal deficits, equal strength   Data Reviewed: I have independently reviewed  following labs and imaging studies   CBC Recent Labs  Lab 03/16/22 0329 03/18/22 0500 03/19/22 0500 03/21/22 0545 03/22/22 0430  WBC 20.3* 16.8* 17.0* 23.3* 19.0*  HGB 10.4* 9.8* 10.6* 10.9* 10.8*  HCT 34.4* 33.2* 35.6* 36.3 36.0  PLT 498* 386 397 382 350  MCV 85.1 87.4 85.6 86.0 85.5  MCH 25.7* 25.8* 25.5* 25.8* 25.7*  MCHC 30.2 29.5* 29.8* 30.0 30.0  RDW 14.5 14.8 14.9 16.0* 15.9*     Recent Labs  Lab 03/16/22 0329 03/16/22 1402 03/16/22 1930 03/17/22 0515 03/18/22 0500 03/19/22 0500 03/20/22 0500 03/20/22 1418 03/21/22 0545 03/22/22 0430  NA 138  --   --  137 138 138 136  --  136 136  K 4.1  --   --  3.5 4.1 2.8* 3.3* 2.9* 3.1* 2.9*  CL 91*  --   --  92* 94* 89* 93*  --  95* 96*  CO2 39*  --   --  38* 40* 41* 36*  --  33* 34*  GLUCOSE 114*  --   --  122* 105* 72 74  --  81 83  BUN 13  --   --  _0 --  16 13  CREATININE 0.50  --   --  0.42* 0.40* 0.36* 0.44  --  0.55 0.50  CALCIUM 8.9  --   --  8.5* 8.7* 8.2* 8.6*  --  8.5* 8.4*   AST  --   --   --   --   --   --   --   --   --  14*  ALT  --   --   --   --   --   --   --   --   --  12  ALKPHOS  --   --   --   --   --   --   --   --   --  39  BILITOT  --   --   --   --   --   --   --   --   --  0.4  ALBUMIN  --   --   --   --   --   --   --   --   --  2.8*  MG 1.9  --   --   --   --   --  1.7  --  2.2 1.9  PROCALCITON  --   --  0.38 0.26 <0.10  --   --   --   --   --   LATICACIDVEN  --  1.3  --   --   --   --   --   --   --   --   BNP  --  61.1  --  42.7  --   --   --   --   --   --      ------------------------------------------------------------------------------------------------------------------ No results for input(s): "CHOL", "HDL", "LDLCALC", "TRIG", "CHOLHDL", "LDLDIRECT" in the last 72 hours.  Lab Results  Component Value Date   HGBA1C 5.7 (H) 03/14/2022   ------------------------------------------------------------------------------------------------------------------ No results for input(s): "TSH", "T4TOTAL", "T3FREE", "THYROIDAB" in the last 72 hours.  Invalid input(s): "FREET3"  Cardiac Enzymes No results for input(s): "CKMB", "TROPONINI", "MYOGLOBIN" in the last 168 hours.  Invalid input(s): "CK" ------------------------------------------------------------------------------------------------------------------    Component Value Date/Time   BNP 42.7 03/17/2022 0515    CBG: Recent Labs  Lab  03/20/22 2121 03/21/22 0804 03/21/22 1324 03/21/22 1720 03/21/22 2211  GLUCAP 180* 93 159* 157* 163*     Recent Results (from the past 240 hour(s))  Resp Panel by RT-PCR (Flu A&B, Covid) Anterior Nasal Swab     Status: None   Collection Time: 03/14/22  8:48 AM   Specimen: Anterior Nasal Swab  Result Value Ref Range Status   SARS Coronavirus 2 by RT PCR NEGATIVE NEGATIVE Final    Comment: (NOTE) SARS-CoV-2 target nucleic acids are NOT DETECTED.  The SARS-CoV-2 RNA is generally detectable in upper respiratory specimens during the acute  phase of infection. The lowest concentration of SARS-CoV-2 viral copies this assay can detect is 138 copies/mL. A negative result does not preclude SARS-Cov-2 infection and should not be used as the sole basis for treatment or other patient management decisions. A negative result may occur with  improper specimen collection/handling, submission of specimen other than nasopharyngeal swab, presence of viral mutation(s) within the areas targeted by this assay, and inadequate number of viral copies(<138 copies/mL). A negative result must be combined with clinical observations, patient history, and epidemiological information. The expected result is Negative.  Fact Sheet for Patients:  EntrepreneurPulse.com.au  Fact Sheet for Healthcare Providers:  IncredibleEmployment.be  This test is no t yet approved or cleared by the [place] FDA and  has been authorized for detection and/or diagnosis of SARS-CoV-2 by FDA under an Emergency Use Authorization (EUA). This EUA will remain  in effect (meaning this test can be used) for the duration of the COVID-19 declaration under Section 564(b)(1) of the Act, 21 U.S.C.section 360bbb-3(b)(1), unless the authorization is terminated  or revoked sooner.       Influenza A by PCR NEGATIVE NEGATIVE Final   Influenza B by PCR NEGATIVE NEGATIVE Final    Comment: (NOTE) The Xpert Xpress SARS-CoV-2/FLU/RSV plus assay is intended as an aid in the diagnosis of influenza from Nasopharyngeal swab specimens and should not be used as a sole basis for treatment. Nasal washings and aspirates are unacceptable for Xpert Xpress SARS-CoV-2/FLU/RSV testing.  Fact Sheet for Patients: EntrepreneurPulse.com.au  Fact Sheet for Healthcare Providers: IncredibleEmployment.be  This test is not yet approved or cleared by the [place] FDA and has been authorized for detection and/or diagnosis of  SARS-CoV-2 by FDA under an Emergency Use Authorization (EUA). This EUA will remain in effect (meaning this test can be used) for the duration of the COVID-19 declaration under Section 564(b)(1) of the Act, 21 U.S.C. section 360bbb-3(b)(1), unless the authorization is terminated or revoked.  Performed at [facility], [place] [address], [place], [place] [address]   Wet prep, genital     Status: None   Collection Time: 03/15/22  8:16 PM   Specimen: Vaginal  Result Value Ref Range Status   Yeast Wet Prep HPF POC NONE SEEN NONE SEEN Final   Trich, Wet Prep NONE SEEN NONE SEEN Final   Clue Cells Wet Prep HPF POC NONE SEEN NONE SEEN Final   WBC, Wet Prep HPF POC <10 <10 Final   Sperm NONE SEEN  Final    Comment: Performed at [facility], [place] [address] [place], [place] [address]  Respiratory (~20 pathogens) panel by PCR     Status: None   Collection Time: 03/16/22  4:00 PM   Specimen: Nasopharyngeal Swab; Respiratory  Result Value Ref Range Status   Adenovirus NOT DETECTED NOT DETECTED Final   Coronavirus 229E NOT DETECTED NOT DETECTED Final  Comment: (NOTE) The Coronavirus on the Respiratory Panel, DOES NOT test for the novel  Coronavirus (2019 nCoV)    Coronavirus HKU1 NOT DETECTED NOT DETECTED Final   Coronavirus NL63 NOT DETECTED NOT DETECTED Final   Coronavirus OC43 NOT DETECTED NOT DETECTED Final   Metapneumovirus NOT DETECTED NOT DETECTED Final   Rhinovirus / Enterovirus NOT DETECTED NOT DETECTED Final   Influenza A NOT DETECTED NOT DETECTED Final   Influenza B NOT DETECTED NOT DETECTED Final   Parainfluenza Virus 1 NOT DETECTED NOT DETECTED Final   Parainfluenza Virus 2 NOT DETECTED NOT DETECTED Final   Parainfluenza Virus 3 NOT DETECTED NOT DETECTED Final   Parainfluenza Virus 4 NOT DETECTED NOT DETECTED Final   Respiratory Syncytial Virus NOT DETECTED NOT DETECTED Final   Bordetella pertussis NOT DETECTED NOT DETECTED Final    Bordetella Parapertussis NOT DETECTED NOT DETECTED Final   Chlamydophila pneumoniae NOT DETECTED NOT DETECTED Final   Mycoplasma pneumoniae NOT DETECTED NOT DETECTED Final    Comment: Performed at [facility], [place] [address] [place], [place] [address]     Radiology Studies: No results found.   Marzetta Board, MD, PhD [facility]  Between 7 am - 7 pm I am available, please contact me via Amion (for emergencies) or Securechat (non urgent messages)  Between 7 pm - 7 am I am not available, please contact night coverage MD/APP via Amion

## 2022-03-22 NOTE — Progress Notes (Signed)
eLink Physician-Brief Progress Note Patient Name: Rhonda Franco DOB: 11-29-2000 MRN: [id number]   Date of Service  03/22/2022  HPI/Events of Note  Notified of electrolyte abnormalities.   K 2.9, Mg 1.9, creatinine 0.50.   eICU Interventions  Replete K and Mg.  85mq PO Kcl and 13m IV Kcl x 4 doses ordered.  1g MgSO4 ordered.     Intervention Category Intermediate Interventions: Electrolyte abnormality - evaluation and management  VaElsie Lincoln/14/2023, 5:35 AM

## 2022-03-22 NOTE — Plan of Care (Signed)

## 2022-03-22 NOTE — Consult Note (Signed)
Palliative Care Consultation Note  Rhonda Franco is a 21 year old woman diagnosed with SLE at the age of 76, followed by [facility] rheumatology who was admitted for respiratory failure related to drug reaction and worsening lupus related pulmonary complications.  She is currently being considered for lung transplant however is not officially on a transplant list at this time.  She has been told in order to receive a lung transplant that she would need to lose 50 pounds.  She denies any pain other than chronic chest wall pain from frequent coughing, she reports that many things have been tried to help with this including benzonatate, different cough syrups, herbal teas and inhalers.  She does not have any joint pain or body pain related to her lupus or any arthritis.  At the time of my consult she reports handling things very well, she is hopeful for the future and that there will be treatment options.  She tells me she has a good family support system.  We discussed the concept of advance care planning and completing a healthcare power of attorney as well as considering her wishes in the event that she was unable to make decisions for herself or had a difficult worsening of her current lupus or she required artificial life support.  She was very open to consideration of these options as well as having a discussion with her mother in terms of identifying healthcare power of attorney.  When I asked her how I could help support her further she said that she needed a primary care physician and that she was very interested in starting a GLP-1 like Ozempic or Mounjaro to help her lose weight and that these had helped her in the past.  She has been unable to obtain a primary care provider she has been admitted able to get these medications she feels that she can lose weight on these and be considered for a life saving transplant.  Recommendations:  Advance Care Planning, Completion of HCPOA, will request chaplain  assistance with completion. Patient needs to establish with a Primary Care Physician or Bariatric Physician to assist her with weight loss goals and chronic disease management. We should help set this up prior to her discharge. Full scope medical treatment and stabilization of her respiratory status. Palliative care team will support as needed during her hospitalization.  Lane Hacker, DO Palliative Medicine  Time: 55 minutes

## 2022-03-22 NOTE — Progress Notes (Signed)
NAME:  Rhonda Franco, MRN:  [id number], DOB:  11-06-2000, LOS: 7 ADMISSION DATE:  03/14/2022, CONSULTATION DATE:  03/14/22 REFERRING MD:  Olevia Bowens CHIEF COMPLAINT:  Dyspnea   History of Present Illness:  Rhonda Franco is a 21 y.o. female who has a PMH as outlined below including but not limited to [facility] and SLE related ILD (followed at [facility] and has had mention of transplant consideration; however, not at goal weight yet). She uses 3 L of oxygen at rest and up to 10 L with activity. She has been managed with Myfortic starting in May 2022 along with prednisone and was switched to Cytoxan. Recently, she was started on Tyvaso. Per her last notes from [facility] (dated 12/20/21), she is currently on Atovaquone Hydroxychloroquine, Mycophenolate, Prednisone.  She presented to [facility] ED 9/6 with worsening dyspnea. She apparently had her Prednisone decreased from 23m to 7.539mdaily 3 - 4 weeks ago. Since then she has had progressive dyspnea. [facility] and was given Lasix and a dose of abx.  In ED today, she was noted to have distress and hypoxia. She was placed on BiPAP which she initially declined but later agreed to. She received 4080masix and has yet to have any UOP. CXR showed bilateral ASD concerning for edema vs infection along with chronic interstitial changes with honeycombing.  She continued to have respiratory rates in the 40's and 50's; hence, PCCM called in consultation.  She is currently anxious but is tolerating BiPAP ok. She does endorse anxiety and claustrophobia and she did receive 0.5mg75mivan earlier to assist with comfort of anxious and claustrophobia.  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 female seen for SLE, a high-risk disease with multiple issues. SLE (2018) +ANA, +Sm, +RNP, +Ro, +dsDNA, ACA IgA 15, +coombs hemoyltic anemia, +lupus anticoagulant. pericardial effusion s/p window, ILD with fibrosis. systolic dysfunction (EF 50%)45%TX, Cellcept, Myfortic (2022-), Benlysta, HCQ. nexplanon. Cytoxan (12/202-11/2021). Rituxan 1000mg59mw 01/2202.    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 Has also had right sided heart failure. Now s/p Cytoxan, followed by recent Rituxan. Recently in OSH with increased SOB with increased edema and hypokalemia, diuretics given and fluid improved, also started on abx Levaquin. Will continue prednisone taper as this may be contributing. Once she completes antibitoics can resume Myfortic.     Significant Hospital Events: Including procedures, antibiotic start and stop dates in addition to other pertinent events   9/6 admit 9.8 - Remained on BiPAP overnight. Back to [facility] but desaturated during Tyvaso treatment. 1300 UOP overnight, net 2.2L. Feels about the same.. IPAL: Full code, started milirnone empiric. PICC Placed 9/9 - NSVTACh this morning on 0.25 of milrinone and stopped. On 65% 25L HFNC. BiPAP Qhs with 2 application in day/ Afebrile/ Is -1.5L Negative. Coox 90 on milrinone. Unable to lie flat due to desats. Currently pulse ox 95%. Havin some nausea after food and is constipated . Mom and aunt at bedside. Levaquin changed to Azactaim  yesterday due to vaginal itch 9/10 -  coox yesterday off milrinon was > 70. PCT < 0.1 STill on HHFNC just dropped to 50% fio2 and 25L [place]. 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 Some better per [facility]. Doing biPAp x 2 in daay and also QHS. Completed IV solumedrol.  Started 73m prednisone today. Tyvasoc continues. Mom at beside. Afebrile 9/11 Feeling clinically improved, weaning HFNC 9/12 bed weight up, pt feeling edematous, able to wean to 15L HFNC yesterday 9/13 No acute issues overnight remains on HFNC, mother at bedside  9/14 No acute issues overnight, on BIPAP this am  Interim History / Subjective:  Sleeping on BIPAP this am  Mom at bedside and updated   Objective:  Blood pressure 117/71, pulse 83, temperature 98.6 F (37 C), temperature source Axillary, resp. rate (!) 31, height _0  (1.651 m), weight 94.9 kg, SpO2 97 %.    FiO2 (%):  [40 %] 40 %   Intake/Output Summary (Last 24 hours) at 03/22/2022 0708 Last data filed at 03/22/2022 05188Gross per 24 hour  Intake 425.85 ml  Output 300 ml  Net 125.85 ml    Filed Weights   03/19/22 0500 03/20/22 0500 03/20/22 1700  Weight: 100.8 kg 100.5 kg 94.9 kg   Physical Exam: General: Well appearing adult female lying in bed on BIPAP this am, in NAD HEENT: [place]/AT, MM pink/moist, PERRL,  Neuro: Alert and oriented, non-focal  CV: s1s2 regular rate and rhythm, no murmur, rubs, or gallops,  PULM:  Clear to ascultation on BIPAP, no increased work of breathing, no added breath sounds  GI: soft, bowel sounds active in all 4 quadrants, non-tender, non-distended, tolerating TF Extremities: warm/dry, non-pitting  edema  Skin: no rashes or lesions  Assessment & Plan:   Acute on chronic hypoxic respiratory failure -  -Secondary to baseline pulmonary hypertension and ILD iwith  honeycombing n the setting of SLE.  High concern for ILD flare in the contex of outpatient pred tape.  Less evidence for decompensated cor pulmonale (milrinone did not help and did not tolerate), BNP nromal, Coox off milrinone normal) -Can consideration of [facility] transfer, however is improving and not a candidate for lung transplant currently until weight at goal -[facility], [facility] will  reschedule following discharge P: Continue supplemental oxygen for sat goal greater than 92% Wean supplemental oxygen as able  PRN BIPAP  Continue 625mPrednisone daily until oxygen requirement decreases  Continue home Atovaquone, Hydroxychloroquine, and Tyavso  Home mycophenolate remains on hold Continue to diurese as able   Steroid induced Hyperglycemia P: Continue SSI   Rest of plan per primary team  Best practice (evaluated daily):  Per primary team.  Critical care: NA  Jadrien Narine D. HaKenton KingfisherNP-C [facility] Personal contact information can be found on Amion  03/22/2022, 7:08 AM

## 2022-03-23 DIAGNOSIS — M3213 Lung involvement in systemic lupus erythematosus: Secondary | ICD-10-CM | POA: Diagnosis not present

## 2022-03-23 DIAGNOSIS — I272 Pulmonary hypertension, unspecified: Secondary | ICD-10-CM | POA: Diagnosis not present

## 2022-03-23 DIAGNOSIS — J9621 Acute and chronic respiratory failure with hypoxia: Secondary | ICD-10-CM | POA: Diagnosis not present

## 2022-03-23 LAB — BASIC METABOLIC PANEL
Anion gap: 9 (ref 5–15)
BUN: 16 mg/dL (ref 6–20)
CO2: 31 mmol/L (ref 22–32)
Calcium: 8.6 mg/dL — ABNORMAL LOW (ref 8.9–10.3)
Chloride: 100 mmol/L (ref 98–111)
Creatinine, Ser: 0.58 mg/dL (ref 0.44–1.00)
GFR, Estimated: >60 mL/min (ref >60)
Glucose, Bld: 97 mg/dL (ref 70–99)
Potassium: 3 mmol/L — ABNORMAL LOW (ref 3.5–5.1)
Sodium: 140 mmol/L (ref 135–145)

## 2022-03-23 LAB — CBC
HCT: 35.7 % — ABNORMAL LOW (ref 36.0–46.0)
Hemoglobin: 10.9 g/dL — ABNORMAL LOW (ref 12.0–15.0)
MCH: 26.2 pg (ref 26.0–34.0)
MCHC: 30.5 g/dL (ref 30.0–36.0)
MCV: 85.8 fL (ref 80.0–100.0)
Platelets: 375 10*3/uL (ref 150–400)
RBC: 4.16 MIL/uL (ref 3.87–5.11)
RDW: 16.1 % — ABNORMAL HIGH (ref 11.5–15.5)
WBC: 19 10*3/uL — ABNORMAL HIGH (ref 4.0–10.5)
nRBC: 0 % (ref 0.0–0.2)

## 2022-03-23 LAB — GLUCOSE, CAPILLARY
Glucose-Capillary: 136 mg/dL — ABNORMAL HIGH (ref 70–99)
Glucose-Capillary: 165 mg/dL — ABNORMAL HIGH (ref 70–99)
Glucose-Capillary: 74 mg/dL (ref 70–99)
Glucose-Capillary: 114 mg/dL — ABNORMAL HIGH (ref 70–99)
Glucose-Capillary: 119 mg/dL — ABNORMAL HIGH (ref 70–99)
Glucose-Capillary: 195 mg/dL — ABNORMAL HIGH (ref 70–99)

## 2022-03-23 MED ORDER — POTASSIUM CHLORIDE CRYS ER 20 MEQ PO TBCR
40.0000 meq | EXTENDED_RELEASE_TABLET | ORAL | Status: AC
  Administered 2022-03-23 (×2): 40 meq via ORAL
  Filled 2022-03-23 (×2): qty 2

## 2022-03-23 MED ORDER — MYCOPHENOLATE SODIUM 180 MG PO TBEC
720.0000 mg | DELAYED_RELEASE_TABLET | Freq: Two times a day (BID) | ORAL | Status: DC
  Administered 2022-03-23 – 2022-03-26 (×7): 720 mg via ORAL
  Filled 2022-03-23 (×7): qty 4

## 2022-03-23 MED ORDER — TORSEMIDE 20 MG PO TABS
20.0000 mg | ORAL_TABLET | Freq: Two times a day (BID) | ORAL | Status: DC
  Administered 2022-03-24 – 2022-03-26 (×5): 20 mg via ORAL
  Filled 2022-03-23 (×5): qty 1

## 2022-03-23 MED ORDER — FUROSEMIDE 10 MG/ML IJ SOLN
40.0000 mg | Freq: Once | INTRAMUSCULAR | Status: AC
  Administered 2022-03-23: 40 mg via INTRAVENOUS
  Filled 2022-03-23: qty 4

## 2022-03-23 MED ORDER — PREDNISONE 20 MG PO TABS
60.0000 mg | ORAL_TABLET | Freq: Every day | ORAL | Status: DC
  Administered 2022-03-24 – 2022-03-25 (×2): 60 mg via ORAL
  Filled 2022-03-23 (×2): qty 3

## 2022-03-23 NOTE — Progress Notes (Signed)
NAME:  Rhonda Franco, MRN:  [id number], DOB:  06-23-2001, LOS: 8 ADMISSION DATE:  03/14/2022, CONSULTATION DATE:  03/14/22 REFERRING MD:  Olevia Bowens CHIEF COMPLAINT:  Dyspnea   History of Present Illness:  Rhonda Franco is a 21 y.o. female who has a PMH as outlined below including but not limited to [facility] and SLE related ILD (followed at [facility] and has had mention of transplant consideration; however, not at goal weight yet). She uses 3 L of oxygen at rest and up to 10 L with activity. She has been managed with Myfortic starting in May 2022 along with prednisone and was switched to Cytoxan. Recently, she was started on Tyvaso. Per her last notes from [facility] (dated 12/20/21), she is currently on Atovaquone Hydroxychloroquine, Mycophenolate, Prednisone.  She presented to [facility] ED 9/6 with worsening dyspnea. She apparently had her Prednisone decreased from 57m to 7.531mdaily 3 - 4 weeks ago. Since then she has had progressive dyspnea. [facility] and was given Lasix and a dose of abx.  In ED today, she was noted to have distress and hypoxia. She was placed on BiPAP which she initially declined but later agreed to. She received 4089masix and has yet to have any UOP. CXR showed bilateral ASD concerning for edema vs infection along with chronic interstitial changes with honeycombing.  She continued to have respiratory rates in the 40's and 50's; hence, PCCM called in consultation.  She is currently anxious but is tolerating BiPAP ok. She does endorse anxiety and claustrophobia and she did receive 0.5mg38mivan earlier to assist with comfort of anxious and claustrophobia.  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 female seen for SLE, a high-risk disease with multiple issues. SLE (2018) +ANA, +Sm, +RNP, +Ro, +dsDNA, ACA IgA 15, +coombs hemoyltic anemia, +lupus anticoagulant. pericardial effusion s/p window, ILD with fibrosis. systolic dysfunction (EF 50%)11%TX, Cellcept, Myfortic (2022-), Benlysta, HCQ. nexplanon. Cytoxan (12/202-11/2021). Rituxan 1000mg61mw 01/2202.    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 Has also had right sided heart failure. Now s/p Cytoxan, followed by recent Rituxan. Recently in OSH with increased SOB with increased edema and hypokalemia, diuretics given and fluid improved, also started on abx Levaquin. Will continue prednisone taper as this may be contributing. Once she completes antibitoics can resume Myfortic.     Significant Hospital Events: Including procedures, antibiotic start and stop dates in addition to other pertinent events   9/6 admit 9.8 - Remained on BiPAP overnight. [place] [facility] but desaturated during Tyvaso treatment. 1300 UOP overnight, net 2.2L. Feels about the same.. IPAL: Full code, started milirnone empiric. PICC Placed 9/9 - NSVTACh this morning on 0.25 of milrinone and stopped. On 65% 25L HFNC. BiPAP Qhs with 2 application in day/ Afebrile/ Is -1.5L Negative. Coox 90 on milrinone. Unable to lie flat due to desats. Currently pulse ox 95%. Havin some nausea after food and is constipated . Mom and aunt at bedside. Levaquin changed to Azactaim  yesterday due to vaginal itch 9/10 -  coox yesterday off milrinon was > 70. PCT < 0.1 STill on HHFNC just dropped to 50% fio2 and 25L [place]. 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 Some better per [facility]. Doing biPAp x 2 in daay and also QHS. Completed IV solumedrol.  Started 81m prednisone today. Tyvasoc continues. Mom at beside. Afebrile 9/11 Feeling clinically improved, weaning HFNC 9/12 bed weight up, pt feeling edematous, able to wean to 15L HFNC yesterday 9/13 No acute issues overnight remains on HFNC, mother at bedside  9/15 No acute issues overnight, on BIPAP this am  Interim History / Subjective:  Seen sitting up in bed on BIPAP States she feels well with no complains, mom at bedside and updated    Objective:  Blood pressure 114/68, pulse 87, temperature 98 F (36.7 C), temperature source Oral, resp. rate (!) 34, height 5' 5" (1.651 m), weight 94.9 kg, SpO2 99 %.    FiO2 (%):  [40 %] 40 %   Intake/Output Summary (Last 24 hours) at 03/23/2022 0705 Last data filed at 03/22/2022 1700 Gross per 24 hour  Intake 502.92 ml  Output 900 ml  Net -397.08 ml    Filed Weights   03/19/22 0500 03/20/22 0500 03/20/22 1700  Weight: 100.8 kg 100.5 kg 94.9 kg   Physical Exam: General: Mildly chronic ill appearing adult female lying, in NAD HEENT: [place]/AT, MM pink/moist, PERRL,  Neuro: Alert and oriented x3, non-focal  CV: s1s2 regular rate and rhythm, no murmur, rubs, or gallops,  PULM:  Clear to ascultation on BIPAP, no added breath sounds GI: soft, bowel sounds active in all 4 quadrants, non-tender, non-distended, tolerating oral diet Extremities: warm/dry, no edema  Skin: no rashes or lesions  Assessment & Plan:   Acute on chronic hypoxic respiratory failure -  -Secondary to baseline pulmonary hypertension and ILD iwith  honeycombing n the setting of SLE.  High concern for ILD flare in the contex of outpatient pred tape.  Less evidence for decompensated cor pulmonale (milrinone did not help and did not tolerate), BNP nromal, Coox off milrinone normal) -Can consideration of [facility] transfer, however is improving and not a candidate for lung transplant currently until weight at goal -[facility] will  reschedule following discharge P: Continue supplemental oxygen for sat goal greater than 92% Wean oxygen as able  PRN and QHS BIPAP  Continue 647mPrednisone until oxygen demand improves  Continue home Atovaquone, Hydroxychloroquine, and Tyavso Will discuss with attending plan to resume home Mycophenolate  Diurese as able   Steroid induced Hyperglycemia P: Continue SSI   Rest of plan per primary team  Best practice (evaluated daily):  Per primary team.  Critical care: NA  Brendon Christoffel D. HaKenton KingfisherNP-C [place] [facility] Personal contact information can be found on Amion  03/23/2022, 7:05 AM

## 2022-03-23 NOTE — Plan of Care (Signed)
Discussed with patient plan of care for the evening, pain management and breathing with some teach back displayed.  Problem: Education: Goal: Knowledge of General Education information will improve Description: Including pain rating scale, medication(s)/side effects and non-pharmacologic comfort measures Outcome: Progressing   Problem: Health Behavior/Discharge Planning: Goal: Ability to manage health-related needs will improve Outcome: Progressing

## 2022-03-23 NOTE — Progress Notes (Addendum)
PROGRESS NOTE  Rhonda Franco TDV:[id number] DOB: March 09, 2001 DOA: 03/14/2022 PCP: Pcp, No   LOS: 8 days   Brief Narrative / Interim history: [name] is a 21 y.o. F with PAH, SLE, SLE-related ILD, chronic respiratory failure on 2-3L at home at rest and up to 10 L with activities, hx RHF, OSA and obesity and anemia who presented with 2 days progrssive dyspnea in the setting of recently starting Tyvaso and titrating down prednisone from 10 to 7.5.  She was requiring BiPAP and heated high flow nasal cannula, was admitted to stepdown and pulmonary was consulted.  On admission chest x-ray showed bilateral opacities.  Significant events: 9/6: Admitted on Lasix, IV steroids and BiPAP, pulm consulted 9/7: 40L HFNC 9/8: Switched to Azactam, PICC placed, milrinone started, on 25L HFNC after Dr. Loleta Books discussed with the heart failure team 9/10: No change; milrinone stopped 9/15: Remains on 25 L 40% FiO2  Significant imaging / results / micro data: Chest x-ray 9/6-worsening airspace disease in both lungs, nonspecific infection versus pulmonary edema.  Prominent main pulmonary artery compatible pulmonary arterial hypertension Chest x-ray 9/10-bilateral airspace disease with mild improvement left upper to midlung field.  No new or worsening infiltrates 2D echo 9/7-LVEF 55-60%, no WMA.  Indeterminate diastolic parameters.  RV systolic function was moderately reduced, tricuspid regurgitation signal is inadequate for assessing PA pressure.  Subjective / 24h Interval events: Appears comfortable this morning.  Mother is at bedside.  Patient is wondering what will happen if she does not get back to where she was  Assesement and Plan: Principal Problem:   Acute on chronic respiratory failure with hypoxia ([place]) Active Problems:   Systemic lupus erythematosus with lung involvement ([place])   Pulmonary hypertension (HCC)   Sleep apnea   Obesity (BMI 30-39.9)   Hypoalbuminemia   Hypokalemia   Chronic  right heart failure (HCC)   Principal problem Acute on chronic respiratory failure with hypoxia (HCC) -Presented with dyspnea, worsening hypoxia, and new infiltrates on chest imaging.  Suspect ILD flare and pulmonary was consulted.  She is followed at [facility] with consideration for lung transplant, however she is not at goal weight.  She recently had prednisone decreased from 10 mg to 7 mg daily, and since then she has had progressive dyspnea.  There was a concern about decompensated right heart failure, Dr. Loleta Books discussed with cardiology and patient was given a trial of milrinone, which did not help.  Milrinone has now been discontinued.  She has completed 3 days of high-dose Solu-Medrol as well as 5 days of antibiotics. -Continue home atovaquone, hydroxychloroquine, treprostinil, mycophenolate and prednisone. -Remains profoundly hypoxic currently on 25 L 40% FIO2, wean off as tolerated.  Will push dose of IV Lasix this morning hopefully will make a difference and can turn down the oxygen   Active problems Chronic right heart failure (HCC) -No pitting edema.  Case discussed with Adv HF by Dr. Loleta Books team and underwent trial milrinone but no improvement in ScvO2.  Currently on torsemide but today will use IV Lasix.  Closely monitor ins and outs   Hypokalemia -Persistent, continue to replete and monitor.  Mag normal.     Hypoalbuminemia -noted   Obesity (BMI 30-39.9) -BMI 34, apparently this is the major barrier for lung transplant   Sleep apnea -Continue BiPAP at night   Pulmonary hypertension (HCC) -Types 1 and 3 PAH. Continue Tyvaso   Systemic lupus erythematosus with lung involvement ([place]) -Stopped Myfortic 2 months ago.  Is tapering down from prednisone,  just prior to onset of symptoms, had gone from 10 mg daily to 7.5.  G6PD normal.       Scheduled Meds:  acetaZOLAMIDE  250 mg Oral BID   aspirin EC  81 mg Oral Daily   atovaquone  1,500 mg Oral Q breakfast   Chlorhexidine Gluconate  Cloth  6 each Topical Daily   enoxaparin (LOVENOX) injection  50 mg Subcutaneous Daily   famotidine  20 mg Oral BID   furosemide  40 mg Intravenous Once   hydroxychloroquine  200 mg Oral BID   insulin aspart  0-9 Units Subcutaneous TID WC   mycophenolate  720 mg Oral BID   mouth rinse  15 mL Mouth Rinse 4 times per day   polyethylene glycol  17 g Oral Daily   potassium chloride  40 mEq Oral Q2H   [START ON 03/24/2022] predniSONE  60 mg Oral Q breakfast   sodium chloride flush  10-40 mL Intracatheter Q12H   [START ON 03/24/2022] torsemide  20 mg Oral BID   Treprostinil  72 mcg Inhalation QID   Vitamin D (Ergocalciferol)  50,000 Units Oral Weekly   Continuous Infusions:  sodium chloride Stopped (03/20/22 0013)   PRN Meds:.sodium chloride, acetaminophen **OR** acetaminophen, alum & mag hydroxide-simeth, levalbuterol, LORazepam, ondansetron **OR** ondansetron (ZOFRAN) IV, mouth rinse, oxyCODONE, polyethylene glycol, senna-docusate, sodium chloride, sodium chloride flush  Current Outpatient Medications  Medication Instructions   acetaZOLAMIDE (DIAMOX) 250 mg, Oral, 2 times daily   aspirin EC 81 mg, Oral, Daily   ergocalciferol (VITAMIN D2) 50,000 Units, Oral, Weekly   famotidine (PEPCID) 20 mg, Oral, 2 times daily   hydroxychloroquine (PLAQUENIL) 200 mg, Oral, 2 times daily   potassium chloride SA (KLOR-CON M) 20 MEQ tablet 20 mEq, Oral, 3 times daily   predniSONE (DELTASONE) 2.5 mg, Oral, 3 times daily   torsemide (DEMADEX) 20 mg, Oral, 2 times daily   Treprostinil (TYVASO IN) 12 puffs, Inhalation, 4 times daily    Diet Orders (From admission, onward)     Start     Ordered   03/14/22 1127  Diet Carb Modified Fluid consistency: Thin; Room service appropriate? Yes  Diet effective now       Question Answer Comment  Diet-HS Snack? Nothing   Calorie Level Medium 1600-2000   Fluid consistency: Thin   Room service appropriate? Yes      03/14/22 1126            DVT prophylaxis:  Lovenox   Lab Results  Component Value Date   PLT 375 03/23/2022      Code Status: Full Code  Family Communication: Mother present at bedside  Status is: Inpatient Remains inpatient appropriate because: severity of illness  Level of care: Stepdown  Consultants:  PCCM  Objective: Vitals:   03/23/22 0600 03/23/22 0800 03/23/22 0803 03/23/22 0900  BP: 114/68     Pulse: 87 94 94 (!) 110  Resp: (!) 34 (!) 34 (!) 29 (!) 31  Temp:  99.4 F (37.4 C)    TempSrc:  Axillary    SpO2: 99% 100% 99% (!) 87%  Weight:      Height:        Intake/Output Summary (Last 24 hours) at 03/23/2022 1136 Last data filed at 03/22/2022 1700 Gross per 24 hour  Intake 262.92 ml  Output 900 ml  Net -637.08 ml    Wt Readings from Last 3 Encounters:  03/20/22 94.9 kg  11/13/21 97.1 kg  10/16/21 107 kg  Examination:  Constitutional: NAD Eyes: lids and conjunctivae normal, no scleral icterus ENMT: mmm Neck: normal, supple Respiratory: Faint bibasilar rhonchi, no wheezing Cardiovascular: Regular rate and rhythm, no murmurs / rubs / gallops. No LE edema. Abdomen: soft, no distention, no tenderness. Bowel sounds positive.  Skin: no rashes Neurologic: no focal deficits, equal strength   Data Reviewed: I have independently reviewed following labs and imaging studies   CBC Recent Labs  Lab 03/18/22 0500 03/19/22 0500 03/21/22 0545 03/22/22 0430 03/23/22 0741  WBC 16.8* 17.0* 23.3* 19.0* 19.0*  HGB 9.8* 10.6* 10.9* 10.8* 10.9*  HCT 33.2* 35.6* 36.3 36.0 35.7*  PLT 386 397 382 350 375  MCV 87.4 85.6 86.0 85.5 85.8  MCH 25.8* 25.5* 25.8* 25.7* 26.2  MCHC 29.5* 29.8* 30.0 30.0 30.5  RDW 14.8 14.9 16.0* 15.9* 16.1*     Recent Labs  Lab 03/16/22 1402 03/16/22 1930 03/17/22 0515 03/17/22 0515 03/18/22 0500 03/19/22 0500 03/20/22 0500 03/20/22 1418 03/21/22 0545 03/22/22 0430 03/23/22 0741  NA  --   --  137   < > 138 138 136  --  136 136 140  K  --   --  3.5   < > 4.1  2.8* 3.3* 2.9* 3.1* 2.9* 3.0*  CL  --   --  92*   < > 94* 89* 93*  --  95* 96* 100  CO2  --   --  38*   < > 40* 41* 36*  --  33* 34* 31  GLUCOSE  --   --  122*   < > 105* 72 74  --  81 83 97  BUN  --   --  14   < > _0 --  _1 CREATININE  --   --  0.42*   < > 0.40* 0.36* 0.44  --  0.55 0.50 0.58  CALCIUM  --   --  8.5*   < > 8.7* 8.2* 8.6*  --  8.5* 8.4* 8.6*  AST  --   --   --   --   --   --   --   --   --  14*  --   ALT  --   --   --   --   --   --   --   --   --  12  --   ALKPHOS  --   --   --   --   --   --   --   --   --  39  --   BILITOT  --   --   --   --   --   --   --   --   --  0.4  --   ALBUMIN  --   --   --   --   --   --   --   --   --  2.8*  --   MG  --   --   --   --   --   --  1.7  --  2.2 1.9  --   PROCALCITON  --  0.38 0.26  --  <0.10  --   --   --   --   --   --   LATICACIDVEN 1.3  --   --   --   --   --   --   --   --   --   --  BNP 61.1  --  42.7  --   --   --   --   --   --   --   --    < > = values in this interval not displayed.     ------------------------------------------------------------------------------------------------------------------ No results for input(s): "CHOL", "HDL", "LDLCALC", "TRIG", "CHOLHDL", "LDLDIRECT" in the last 72 hours.  Lab Results  Component Value Date   HGBA1C 5.7 (H) 03/14/2022   ------------------------------------------------------------------------------------------------------------------ No results for input(s): "TSH", "T4TOTAL", "T3FREE", "THYROIDAB" in the last 72 hours.  Invalid input(s): "FREET3"  Cardiac Enzymes No results for input(s): "CKMB", "TROPONINI", "MYOGLOBIN" in the last 168 hours.  Invalid input(s): "CK" ------------------------------------------------------------------------------------------------------------------    Component Value Date/Time   BNP 42.7 03/17/2022 0515    CBG: Recent Labs  Lab 03/22/22 0750 03/22/22 1149 03/22/22 1628 03/22/22 2246 03/23/22 0723  GLUCAP 79  136* 165* 112* 74     Recent Results (from the past 240 hour(s))  Resp Panel by RT-PCR (Flu A&B, Covid) Anterior Nasal Swab     Status: None   Collection Time: 03/14/22  8:48 AM   Specimen: Anterior Nasal Swab  Result Value Ref Range Status   SARS Coronavirus 2 by RT PCR NEGATIVE NEGATIVE Final    Comment: (NOTE) SARS-CoV-2 target nucleic acids are NOT DETECTED.  The SARS-CoV-2 RNA is generally detectable in upper respiratory specimens during the acute phase of infection. The lowest concentration of SARS-CoV-2 viral copies this assay can detect is 138 copies/mL. A negative result does not preclude SARS-Cov-2 infection and should not be used as the sole basis for treatment or other patient management decisions. A negative result may occur with  improper specimen collection/handling, submission of specimen other than nasopharyngeal swab, presence of viral mutation(s) within the areas targeted by this assay, and inadequate number of viral copies(<138 copies/mL). A negative result must be combined with clinical observations, patient history, and epidemiological information. The expected result is Negative.  Fact Sheet for Patients:  EntrepreneurPulse.com.au  Fact Sheet for Healthcare Providers:  IncredibleEmployment.be  This test is no t yet approved or cleared by the [place] FDA and  has been authorized for detection and/or diagnosis of SARS-CoV-2 by FDA under an Emergency Use Authorization (EUA). This EUA will remain  in effect (meaning this test can be used) for the duration of the COVID-19 declaration under Section 564(b)(1) of the Act, 21 U.S.C.section 360bbb-3(b)(1), unless the authorization is terminated  or revoked sooner.       Influenza A by PCR NEGATIVE NEGATIVE Final   Influenza B by PCR NEGATIVE NEGATIVE Final    Comment: (NOTE) The Xpert Xpress SARS-CoV-2/FLU/RSV plus assay is intended as an aid in the diagnosis of  influenza from Nasopharyngeal swab specimens and should not be used as a sole basis for treatment. Nasal washings and aspirates are unacceptable for Xpert Xpress SARS-CoV-2/FLU/RSV testing.  Fact Sheet for Patients: EntrepreneurPulse.com.au  Fact Sheet for Healthcare Providers: IncredibleEmployment.be  This test is not yet approved or cleared by the [place] FDA and has been authorized for detection and/or diagnosis of SARS-CoV-2 by FDA under an Emergency Use Authorization (EUA). This EUA will remain in effect (meaning this test can be used) for the duration of the COVID-19 declaration under Section 564(b)(1) of the Act, 21 U.S.C. section 360bbb-3(b)(1), unless the authorization is terminated or revoked.  Performed at [facility], [place] [address], [place], [place] [address]   Wet prep, genital     Status: None   Collection Time: 03/15/22  8:16 PM   Specimen: Vaginal  Result Value Ref Range Status   Yeast Wet Prep HPF POC NONE SEEN NONE SEEN Final   Trich, Wet Prep NONE SEEN NONE SEEN Final   Clue Cells Wet Prep HPF POC NONE SEEN NONE SEEN Final   WBC, Wet Prep HPF POC <10 <10 Final   Sperm NONE SEEN  Final    Comment: Performed at [facility], [place] [address] [place], [place] [address]  Respiratory (~20 pathogens) panel by PCR     Status: None   Collection Time: 03/16/22  4:00 PM   Specimen: Nasopharyngeal Swab; Respiratory  Result Value Ref Range Status   Adenovirus NOT DETECTED NOT DETECTED Final   Coronavirus 229E NOT DETECTED NOT DETECTED Final    Comment: (NOTE) The Coronavirus on the Respiratory Panel, DOES NOT test for the novel  Coronavirus (2019 nCoV)    Coronavirus HKU1 NOT DETECTED NOT DETECTED Final   Coronavirus NL63 NOT DETECTED NOT DETECTED Final   Coronavirus OC43 NOT DETECTED NOT DETECTED Final   Metapneumovirus NOT DETECTED NOT DETECTED Final   Rhinovirus / Enterovirus NOT  DETECTED NOT DETECTED Final   Influenza A NOT DETECTED NOT DETECTED Final   Influenza B NOT DETECTED NOT DETECTED Final   Parainfluenza Virus 1 NOT DETECTED NOT DETECTED Final   Parainfluenza Virus 2 NOT DETECTED NOT DETECTED Final   Parainfluenza Virus 3 NOT DETECTED NOT DETECTED Final   Parainfluenza Virus 4 NOT DETECTED NOT DETECTED Final   Respiratory Syncytial Virus NOT DETECTED NOT DETECTED Final   Bordetella pertussis NOT DETECTED NOT DETECTED Final   Bordetella Parapertussis NOT DETECTED NOT DETECTED Final   Chlamydophila pneumoniae NOT DETECTED NOT DETECTED Final   Mycoplasma pneumoniae NOT DETECTED NOT DETECTED Final    Comment: Performed at [facility], [place]. [address] [place], [place] [address]     Radiology Studies: No results found.   Marzetta Board, MD, PhD [facility]  Between 7 am - 7 pm I am available, please contact me via Amion (for emergencies) or Securechat (non urgent messages)  Between 7 pm - 7 am I am not available, please contact night coverage MD/APP via Amion

## 2022-03-24 DIAGNOSIS — M3213 Lung involvement in systemic lupus erythematosus: Secondary | ICD-10-CM | POA: Diagnosis not present

## 2022-03-24 DIAGNOSIS — J810 Acute pulmonary edema: Secondary | ICD-10-CM | POA: Diagnosis not present

## 2022-03-24 DIAGNOSIS — I50812 Chronic right heart failure: Secondary | ICD-10-CM | POA: Diagnosis not present

## 2022-03-24 DIAGNOSIS — E876 Hypokalemia: Secondary | ICD-10-CM | POA: Diagnosis not present

## 2022-03-24 DIAGNOSIS — J9621 Acute and chronic respiratory failure with hypoxia: Secondary | ICD-10-CM | POA: Diagnosis not present

## 2022-03-24 LAB — CBC
HCT: 35.4 % — ABNORMAL LOW (ref 36.0–46.0)
Hemoglobin: 10.7 g/dL — ABNORMAL LOW (ref 12.0–15.0)
MCH: 26.1 pg (ref 26.0–34.0)
MCHC: 30.2 g/dL (ref 30.0–36.0)
MCV: 86.3 fL (ref 80.0–100.0)
Platelets: 369 10*3/uL (ref 150–400)
RBC: 4.1 MIL/uL (ref 3.87–5.11)
RDW: 16.2 % — ABNORMAL HIGH (ref 11.5–15.5)
WBC: 21 10*3/uL — ABNORMAL HIGH (ref 4.0–10.5)
nRBC: 0 % (ref 0.0–0.2)

## 2022-03-24 LAB — GLUCOSE, CAPILLARY
Glucose-Capillary: 82 mg/dL (ref 70–99)
Glucose-Capillary: 172 mg/dL — ABNORMAL HIGH (ref 70–99)
Glucose-Capillary: 176 mg/dL — ABNORMAL HIGH (ref 70–99)
Glucose-Capillary: 130 mg/dL — ABNORMAL HIGH (ref 70–99)

## 2022-03-24 LAB — BASIC METABOLIC PANEL
Anion gap: 6 (ref 5–15)
BUN: 14 mg/dL (ref 6–20)
CO2: 33 mmol/L — ABNORMAL HIGH (ref 22–32)
Calcium: 8.6 mg/dL — ABNORMAL LOW (ref 8.9–10.3)
Chloride: 99 mmol/L (ref 98–111)
Creatinine, Ser: 0.44 mg/dL (ref 0.44–1.00)
GFR, Estimated: >60 mL/min (ref >60)
Glucose, Bld: 105 mg/dL — ABNORMAL HIGH (ref 70–99)
Potassium: 3.6 mmol/L (ref 3.5–5.1)
Sodium: 138 mmol/L (ref 135–145)

## 2022-03-24 LAB — MAGNESIUM: Magnesium: 2 mg/dL (ref 1.7–2.4)

## 2022-03-24 NOTE — Progress Notes (Signed)
Physical Therapy Treatment Patient Details Name: Rhonda Franco MRN: [id number] DOB: [birth year] Today's Date: 03/24/2022   History of Present Illness Patient is a 21 year old Female admitted on 03/14/22 with acute on chronic resp failure.  Pt with 2 day progressive dyspnea in the setting of recently starting Tyvaso and titrating down prednisone, now requiring >10L O2.  Pt has required HFNC and bipap during admission. Pt with systemic lupus erythematosus with related ILD, chronic respiratory failure on 3-10 LPM at home, pHTN, hx RHF, OSA and obesity and anemia    PT Comments    Patient  eager to ambulate in hall.  Patient resting on 5 L at 955. HR 110.  Patient stated that she uses 10 L with activity.  Patient  ambulated on 10 L HFNC, spo2  remained > 91% during ambulation , Max HR 143.  Placed on 6 L after return to room, SPO2 755, gradually  returned to 88%. Per mother, this is normal to drop low.  Continue PT.    Recommendations for follow up therapy are one component of a multi-disciplinary discharge planning process, led by the attending physician.  Recommendations may be updated based on patient status, additional functional criteria and insurance authorization.  Follow Up Recommendations  No PT follow up     Assistance Recommended at Discharge None  Patient can return home with the following Help with stairs or ramp for entrance;Assist for transportation;Assistance with cooking/housework   Equipment Recommendations  None recommended by PT    Recommendations for Other Services       Precautions / Restrictions Precautions Precaution Comments: monitor sats and HR     Mobility  Bed Mobility Overal bed mobility: Independent                  Transfers Overall transfer level: Independent                      Ambulation/Gait Ambulation/Gait assistance: Supervision Gait Distance (Feet): 210 Feet Assistive device: None Gait Pattern/deviations: WFL(Within  Functional Limits)   Gait velocity interpretation: <1.31 ft/sec, indicative of household ambulator   General Gait Details: placed on 10 L per pt request, on 6 at rest.   Stairs             Wheelchair Mobility    Modified Rankin (Stroke Patients Only)       Balance Overall balance assessment: Independent                                          Cognition Arousal/Alertness: Awake/alert                                              Exercises      General Comments        Pertinent Vitals/Pain Pain Assessment Pain Assessment: No/denies pain    Home Living                          Prior Function            PT Goals (current goals can now be found in the care plan section) Progress towards PT goals: Progressing toward goals    Frequency    Min 3X/week  PT Plan Current plan remains appropriate    Co-evaluation              AM-PAC PT "6 Clicks" Mobility   Outcome Measure  Help needed turning from your back to your side while in a flat bed without using bedrails?: None Help needed moving from lying on your back to sitting on the side of a flat bed without using bedrails?: None Help needed moving to and from a bed to a chair (including a wheelchair)?: None Help needed standing up from a chair using your arms (e.g., wheelchair or bedside chair)?: None Help needed to walk in hospital room?: A Little Help needed climbing 3-5 steps with a railing? : A Little 6 Click Score: 22    End of Session Equipment Utilized During Treatment: Oxygen Activity Tolerance: Patient tolerated treatment well Patient left: in bed;with call bell/phone within reach;with family/visitor present Nurse Communication: Mobility status PT Visit Diagnosis: Difficulty in walking, not elsewhere classified (R26.2)     Time: 1448-1510 PT Time Calculation (min) (ACUTE ONLY): 22 min  Charges:  $Gait Training: 8-22 mins                      [place] Office [phone] Weekend [phone]    Claretha Cooper 03/24/2022, 3:25 PM

## 2022-03-24 NOTE — Progress Notes (Signed)
NAME:  Rhonda Franco, MRN:  [id number], DOB:  2000-11-18, LOS: 9 ADMISSION DATE:  03/14/2022, CONSULTATION DATE:  03/14/22 REFERRING MD:  Olevia Bowens CHIEF COMPLAINT:  Dyspnea   History of Present Illness:  Rhonda Franco is a 21 y.o. female who has a PMH as outlined below including but not limited to [facility] and SLE related ILD (followed at [facility] and has had mention of transplant consideration; however, not at goal weight yet). She uses 3 L of oxygen at rest and up to 10 L with activity. She has been managed with Myfortic starting in May 2022 along with prednisone and was switched to Cytoxan. Recently, she was started on Tyvaso. Per her last notes from [facility] (dated 12/20/21), she is currently on Atovaquone Hydroxychloroquine, Mycophenolate, Prednisone.  She presented to [facility] ED 9/6 with worsening dyspnea. She apparently had her Prednisone decreased from 7m to 7.553mdaily 3 - 4 weeks ago. Since then she has had progressive dyspnea. [facility] and was given Lasix and a dose of abx.  In ED today, she was noted to have distress and hypoxia. She was placed on BiPAP which she initially declined but later agreed to. She received [id number] and has yet to have any UOP. CXR showed bilateral ASD concerning for edema vs infection along with chronic interstitial changes with honeycombing.  She continued to have respiratory rates in the 40's and 50's; hence, PCCM called in consultation.   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 female seen for SLE, a high-risk disease with multiple issues. SLE (2018) +ANA, +Sm, +RNP, +Ro, +dsDNA, ACA IgA 15, +coombs hemoyltic anemia, +lupus anticoagulant. pericardial effusion s/p window, ILD with fibrosis. systolic dysfunction (EF 50%76%MTX, Cellcept, Myfortic (2022-), Benlysta, HCQ. nexplanon. Cytoxan (12/202-11/2021). Rituxan 1000m28mxw 01/2202.    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 Has also had right sided heart failure. Now s/p Cytoxan, followed by recent Rituxan. Recently in OSH with increased SOB with increased edema and hypokalemia, diuretics given and fluid improved, also started on abx Levaquin. Will continue prednisone taper as this may be contributing. Once she completes antibitoics can resume Myfortic.     Significant Hospital Events: Including procedures, antibiotic start and stop dates in addition to other pertinent events   9/6 admit 9.8 - Remained on BiPAP overnight. Back to [facility] but desaturated during Tyvaso treatment. 1300 UOP overnight, net 2.2L. Feels about the same.. IPAL: Full code, started milirnone empiric. PICC Placed 9/9 - NSVTACh this morning on 0.25 of milrinone and stopped. On 65% 25L HFNC. BiPAP Qhs with 2 application in day/ Afebrile/ Is -1.5L Negative. Coox 90 on milrinone. Unable to lie flat due to desats. Currently pulse ox 95%. Havin some nausea after food and is constipated . Mom and aunt at bedside. Levaquin changed to Azactaim  yesterday due to vaginal itch 9/10 - coox yesterday off milrinon was > 70. PCT < 0.1 STill on HHFNC just dropped to 50% fio2 and 25L [place]. 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 Some better  per [facility]. Doing biPAp x 2 in daay and also QHS. Completed IV solumedrol. Started 37m prednisone today. Tyvasoc continues. Mom at beside. Afebrile 9/11 Feeling clinically improved, weaning HFNC 9/12 bed weight up, pt feeling edematous, able to wean to 15L HFNC  yesterday 9/13 remains on HFNC, mother at bedside  9/15 on BIPAP overnight  Interim History / Subjective:   Transition from heated high flow to salter HFNC 10 L Used BiPAP overnight. Down to 8 L this morning Afebrile, mild tachycardia  Objective:  Blood pressure 112/74, pulse (!) 102, temperature 98.3 F (36.8 C), temperature source Axillary, resp. rate (!) 32, height _0  (1.651 m), weight 94.9 kg, SpO2 100 %.    FiO2 (%):  [40 %] 40 %   Intake/Output Summary (Last 24 hours) at 03/24/2022 0924 Last data filed at 03/24/2022 0500 Gross per 24 hour  Intake 380 ml  Output --  Net 380 ml    Filed Weights   03/19/22 0500 03/20/22 0500 03/20/22 1700  Weight: 100.8 kg 100.5 kg 94.9 kg   Physical Exam: General:  chronic ill appearing adult female being up in bed, no distress HEENT: [place]/AT, MM pink/moist, PERRL,  Neuro: Alert and oriented x3, non-focal  CV: s1s2 regular rate and rhythm, no murmur, rubs, or gallops,  PULM: Bibasal dry crackles, no accessory muscle use GI: soft, bowel sounds active in all 4 quadrants, non-tender, non-distended, tolerating oral diet Extremities: warm/dry, no edema  Skin: no rashes or lesions  Labs show stable leukocytosis, normal electrolytes  Assessment & Plan:   Acute on chronic hypoxic respiratory failure -  -Secondary to baseline pulmonary hypertension and ILD iwith  honeycombing n the setting of SLE.  High concern for ILD flare in the contex of outpatient pred taper.  Less evidence for decompensated cor pulmonale (milrinone did not help and did not tolerate), BNP nromal, Coox off milrinone normal) -Can consideration of [facility] transfer, however is improving and not a candidate for lung transplant currently until weight at goal -[facility], [facility] following discharge P: Tolerating salter high flow nasal cannula at 8 L, decrease as tolerated Continue 662mPrednisone until oxygen demand  improves, can drop to 40 mg on discharge and slow taper down to baseline dose of 10 mg Continue home Atovaquone, Hydroxychloroquine, and Tyavso resumed home Mycophenolate  Diurese for equal balance  Steroid induced Hyperglycemia P: Continue SSI   She has made good progress  Best practice (evaluated daily):  Per primary team.    RaKara MeadD. FCCP. [facility] Pager : [phone]  If no response to pager , please call [phone] until 7 pm After 7:00 pm call Elink  [phone]    03/24/2022, 9:24 AM

## 2022-03-24 NOTE — Plan of Care (Signed)
  Problem: Clinical Measurements: Goal: Will remain free from infection Outcome: Progressing   Problem: Activity: Goal: Risk for activity intolerance will decrease Outcome: Progressing   Problem: Nutrition: Goal: Adequate nutrition will be maintained Outcome: Progressing   Problem: Coping: Goal: Level of anxiety will decrease Outcome: Progressing

## 2022-03-24 NOTE — Progress Notes (Addendum)
[facility]                                                                              Rhonda Franco, is a 21 y.o. female, DOB - 09-09-2000, CXF:[id number] Admit date - 03/14/2022    Outpatient Primary MD for the patient is Pcp, No  LOS - 9  days  Chief Complaint  Patient presents with   Shortness of Breath       Brief summary   Rhonda Franco is a 21 y.o. F with PAH, SLE, SLE-related ILD, chronic respiratory failure on 2-3L at home at rest and up to 10 L with activities, hx RHF, OSA and obesity and anemia who presented with 2 days progrssive dyspnea in the setting of recently starting Tyvaso and titrating down prednisone from 10 to 7.5.  She was requiring BiPAP and heated high flow nasal cannula, was admitted to stepdown and pulmonary was consulted.  On admission chest x-ray showed bilateral opacities. Significant events 9/6: Admitted on Lasix, IV steroids and BiPAP, pulm consulted 9/7: 40L HFNC 9/8: Switched to Azactam, PICC placed, milrinone started, on 25L HFNC after Dr. Loleta Books discussed with the heart failure team 9/10: No change; milrinone stopped 9/15: Remains on 25 L 40% FiO2 9/16: Overnight placed on BiPAP, weaned O2 to 8 L HFNC, tolerating so far    Assessment & Plan    Principal Problem:   Acute on chronic respiratory failure with hypoxia ([place]) -Presented with worsening hypoxia, new infiltrates on CXR in the setting of ILD.  Followed at [facility] with consideration for lung transplant however not at goal weight.  She recently had prednisone decreased from 10 mg to 7 mg daily and has had progressive dyspnea. -Decreased O2 to 8 L HFNC, tolerating so far, plan to wean down to 6 L if tolerated -Patient requesting if home BiPAP can be arranged, will discuss with pulmonology -Per pulmonology, continue 60 mg primary on until O2 demand improved, then taper to 40 mg on discharge and slow taper to baseline of 10 mg. -Continue home atovaquone, hydroxychloroquine,  Tyavso, mycophenolate -Received IV Lasix on 9/15, negative balance of 3.9 L Addendum 11:08am Discussed with Dr. Elsworth Soho, pulmonology, she does not qualify for home Bipap  Active problems Chronic right heart failure ([place])  - Dr Loleta Books discussed with CHF team, underwent trial of milrinone with no significant improvement -Continue torsemide 40 mg daily, received Lasix 40 mg IV x1 on 9/15 -Monitor I's and O's, negative balance of 3.9 L   Hypokalemia, hypomagnesemia -Currently stable   OSA -Continue BiPAP at night, - patient reports she does not have BiPAP at home, will discuss with pulmonology for home BiPAP  Pulmonary hypertension ([place]) --Types 1 and 3 PAH. Continue Tyvaso   Systemic lupus erythematosus with lung involvement ([place]) -Stopped Myfortic 2 months ago.  She was tapering down from prednisone, just prior to onset of symptoms, had gone from 10 mg daily to 7.5.  G6PD normal. -Continue prednisone 60 mg, plan to taper to 40 mg on discharge and slow taper down to baseline dose of 10 mg daily  Steroid-induced hyperglycemia -Continue sliding scale insulin, CBG's stable  Obesity Estimated body mass index is 34.81 kg/m as calculated from the following:   Height as of this encounter: _0  (1.651 m).   Weight as of this encounter: 94.9 kg. -Barrier for lung pretransplant  Code Status: Full CODE STATUS DVT Prophylaxis:    Lovenox   Level of Care: Level of care: Stepdown Family Communication: Updated patient's mother at the bedside  Disposition Plan:      Remains inpatient appropriate: severity of illness, overnight on BiPAP   Procedures:  2D echo 9/7-LVEF 55-60%, no WMA.  Indeterminate diastolic parameters.  RV systolic function was moderately reduced, tricuspid regurgitation signal is inadequate for assessing PA pressure.  Consultants: Pulmonology   Antimicrobials:   Anti-infectives (From admission, onward)    Start     Dose/Rate Route Frequency Ordered Stop    03/17/22 1000  azithromycin (ZITHROMAX) 500 mg in sodium chloride 0.9 % 250 mL IVPB        500 mg 250 mL/hr over 60 Minutes Intravenous Every 24 hours 03/16/22 1437 03/18/22 1157   03/17/22 0600  aztreonam (AZACTAM) 2 g in sodium chloride 0.9 % 100 mL IVPB  Status:  Discontinued        2 g 200 mL/hr over 30 Minutes Intravenous Every 8 hours 03/16/22 1437 03/19/22 1700   03/15/22 1100  levofloxacin (LEVAQUIN) IVPB 750 mg  Status:  Discontinued        750 mg 100 mL/hr over 90 Minutes Intravenous Every 24 hours 03/15/22 0926 03/16/22 1417   03/15/22 0800  atovaquone (MEPRON) 750 MG/5ML suspension 1,500 mg        1,500 mg Oral Daily with breakfast 03/14/22 1251     03/14/22 2200  hydroxychloroquine (PLAQUENIL) tablet 200 mg        200 mg Oral 2 times daily 03/14/22 1436     03/14/22 1000  levofloxacin (LEVAQUIN) IVPB 750 mg  Status:  Discontinued        750 mg 100 mL/hr over 90 Minutes Intravenous  Once 03/14/22 0958 03/14/22 1900          Medications  acetaZOLAMIDE  250 mg Oral BID   aspirin EC  81 mg Oral Daily   atovaquone  1,500 mg Oral Q breakfast   Chlorhexidine Gluconate Cloth  6 each Topical Daily   enoxaparin (LOVENOX) injection  50 mg Subcutaneous Daily   famotidine  20 mg Oral BID   hydroxychloroquine  200 mg Oral BID   insulin aspart  0-9 Units Subcutaneous TID WC   mycophenolate  720 mg Oral BID   mouth rinse  15 mL Mouth Rinse 4 times per day   polyethylene glycol  17 g Oral Daily   predniSONE  60 mg Oral Q breakfast   sodium chloride flush  10-40 mL Intracatheter Q12H   torsemide  20 mg Oral BID   Treprostinil  72 mcg Inhalation QID   Vitamin D (Ergocalciferol)  50,000 Units Oral Weekly      Subjective:   Rhonda Franco was seen and examined today.  Overnight needed BiPAP, on 10 L HFNC, weaned to 8 L, tolerating so far.  Sats remain in 899 to 100%.  No fevers or coughing.   Patient denies dizziness, chest pain,  abdominal pain, N/V/D/C.   Objective:    Vitals:   03/24/22 0334 03/24/22 0700 03/24/22 0800 03/24/22 0811  BP:      Pulse: 84 81 (!) 102   Resp: (!) 30 (!) 32    Temp:   98.3  F (36.8 C)   TempSrc:   Axillary   SpO2: 94% 99% 97% 100%  Weight:      Height:        Intake/Output Summary (Last 24 hours) at 03/24/2022 1026 Last data filed at 03/24/2022 0500 Gross per 24 hour  Intake 380 ml  Output --  Net 380 ml     Wt Readings from Last 3 Encounters:  03/20/22 94.9 kg  11/13/21 97.1 kg  10/16/21 107 kg     Exam General: Alert and oriented x 3, NAD, on BiPAP Cardiovascular: S1 S2 auscultated,  RRR Respiratory: Bibasilar dry crackles Gastrointestinal: Soft, nontender, nondistended, + bowel sounds Ext: no pedal edema bilaterally Neuro: no new FND's Psych: Normal affect and demeanor, alert and oriented x3     Data Reviewed:  I have personally reviewed following labs    CBC Lab Results  Component Value Date   WBC 21.0 (H) 03/24/2022   RBC 4.10 03/24/2022   HGB 10.7 (L) 03/24/2022   HCT 35.4 (L) 03/24/2022   MCV 86.3 03/24/2022   MCH 26.1 03/24/2022   PLT 369 03/24/2022   MCHC 30.2 03/24/2022   RDW 16.2 (H) 03/24/2022   LYMPHSABS 0.7 03/14/2022   MONOABS 1.5 (H) 03/14/2022   EOSABS 0.5 03/14/2022   BASOSABS 0.1 05/05/9021     Last metabolic panel Lab Results  Component Value Date   NA 138 03/24/2022   K 3.6 03/24/2022   CL 99 03/24/2022   CO2 33 (H) 03/24/2022   BUN 14 03/24/2022   CREATININE 0.44 03/24/2022   GLUCOSE 105 (H) 03/24/2022   GFRNONAA >60 03/24/2022   GFRAA 153 01/15/2020   CALCIUM 8.6 (L) 03/24/2022   PHOS 3.5 03/22/2022   PROT 5.9 (L) 03/22/2022   ALBUMIN 2.8 (L) 03/22/2022   LABGLOB 4.1 01/15/2020   AGRATIO 0.9 (L) 01/15/2020   BILITOT 0.4 03/22/2022   ALKPHOS 39 03/22/2022   AST 14 (L) 03/22/2022   ALT 12 03/22/2022   ANIONGAP 6 03/24/2022    CBG (last 3)  Recent Labs    03/23/22 1137 03/23/22 1703 03/23/22 2134  GLUCAP 114* 119* 195*         Edwardo Wojnarowski M.D. [facility] 03/24/2022, 10:26 AM  Available via Epic secure chat 7am-7pm After 7 pm, please refer to night coverage provider listed on amion.

## 2022-03-24 NOTE — Progress Notes (Signed)
Pt wanted to wait until after 0130 tyvaso inhaler to use BiPAP

## 2022-03-25 ENCOUNTER — Telehealth: Payer: Self-pay | Admitting: Pulmonary Disease

## 2022-03-25 DIAGNOSIS — J9621 Acute and chronic respiratory failure with hypoxia: Secondary | ICD-10-CM | POA: Diagnosis not present

## 2022-03-25 DIAGNOSIS — E8809 Other disorders of plasma-protein metabolism, not elsewhere classified: Secondary | ICD-10-CM | POA: Diagnosis not present

## 2022-03-25 DIAGNOSIS — I50812 Chronic right heart failure: Secondary | ICD-10-CM | POA: Diagnosis not present

## 2022-03-25 DIAGNOSIS — J849 Interstitial pulmonary disease, unspecified: Secondary | ICD-10-CM

## 2022-03-25 DIAGNOSIS — J810 Acute pulmonary edema: Secondary | ICD-10-CM | POA: Diagnosis not present

## 2022-03-25 LAB — GLUCOSE, CAPILLARY
Glucose-Capillary: 85 mg/dL (ref 70–99)
Glucose-Capillary: 168 mg/dL — ABNORMAL HIGH (ref 70–99)
Glucose-Capillary: 190 mg/dL — ABNORMAL HIGH (ref 70–99)
Glucose-Capillary: 235 mg/dL — ABNORMAL HIGH (ref 70–99)
Glucose-Capillary: 208 mg/dL — ABNORMAL HIGH (ref 70–99)

## 2022-03-25 LAB — CBC
HCT: 35.5 % — ABNORMAL LOW (ref 36.0–46.0)
Hemoglobin: 10.7 g/dL — ABNORMAL LOW (ref 12.0–15.0)
MCH: 26.3 pg (ref 26.0–34.0)
MCHC: 30.1 g/dL (ref 30.0–36.0)
MCV: 87.2 fL (ref 80.0–100.0)
Platelets: 361 10*3/uL (ref 150–400)
RBC: 4.07 MIL/uL (ref 3.87–5.11)
RDW: 16.5 % — ABNORMAL HIGH (ref 11.5–15.5)
WBC: 20.4 10*3/uL — ABNORMAL HIGH (ref 4.0–10.5)
nRBC: 0 % (ref 0.0–0.2)

## 2022-03-25 LAB — BASIC METABOLIC PANEL
Anion gap: 7 (ref 5–15)
BUN: 18 mg/dL (ref 6–20)
CO2: 31 mmol/L (ref 22–32)
Calcium: 8.4 mg/dL — ABNORMAL LOW (ref 8.9–10.3)
Chloride: 98 mmol/L (ref 98–111)
Creatinine, Ser: 0.66 mg/dL (ref 0.44–1.00)
GFR, Estimated: >60 mL/min (ref >60)
Glucose, Bld: 81 mg/dL (ref 70–99)
Potassium: 2.9 mmol/L — ABNORMAL LOW (ref 3.5–5.1)
Sodium: 136 mmol/L (ref 135–145)

## 2022-03-25 LAB — BLOOD GAS, ARTERIAL
Acid-Base Excess: 8.8 mmol/L — ABNORMAL HIGH (ref 0.0–2.0)
Bicarbonate: 33.5 mmol/L — ABNORMAL HIGH (ref 20.0–28.0)
Drawn by: 308601
O2 Saturation: 95 %
Patient temperature: 37
pCO2 arterial: 45 mmHg (ref 32–48)
pH, Arterial: 7.48 — ABNORMAL HIGH (ref 7.35–7.45)
pO2, Arterial: 69 mmHg — ABNORMAL LOW (ref 83–108)

## 2022-03-25 MED ORDER — POTASSIUM CHLORIDE 10 MEQ/100ML IV SOLN
10.0000 meq | INTRAVENOUS | Status: AC
  Administered 2022-03-25 (×2): 10 meq via INTRAVENOUS
  Filled 2022-03-25 (×2): qty 100

## 2022-03-25 MED ORDER — POTASSIUM CHLORIDE CRYS ER 20 MEQ PO TBCR
40.0000 meq | EXTENDED_RELEASE_TABLET | Freq: Once | ORAL | Status: AC
  Administered 2022-03-25: 40 meq via ORAL
  Filled 2022-03-25: qty 2

## 2022-03-25 NOTE — Progress Notes (Signed)
[facility]                                                                              Rhonda Franco, is a 21 y.o. female, DOB - Oct 13, 2000, ZGY:[id number] Admit date - 03/14/2022    Outpatient Primary MD for the patient is Pcp, No  LOS - 10  days  Chief Complaint  Patient presents with   Shortness of Breath       Brief summary   Mrs. Rausch is a 21 y.o. F with PAH, SLE, SLE-related ILD, chronic respiratory failure on 2-3L at home at rest and up to 10 L with activities, hx RHF, OSA and obesity and anemia who presented with 2 days progrssive dyspnea in the setting of recently starting Tyvaso and titrating down prednisone from 10 to 7.5.  She was requiring BiPAP and heated high flow nasal cannula, was admitted to stepdown and pulmonary was consulted.  On admission chest x-ray showed bilateral opacities. Significant events 9/6: Admitted on Lasix, IV steroids and BiPAP, pulm consulted 9/7: 40L HFNC 9/8: Switched to Azactam, PICC placed, milrinone started, on 25L HFNC after Dr. Loleta Books discussed with the heart failure team 9/10: No change; milrinone stopped 9/15: Remains on 25 L 40% FiO2 9/16: Overnight placed on BiPAP, weaned O2 to 8 L HFNC, tolerating so far    Assessment & Plan    Principal Problem:   Acute on chronic respiratory failure with hypoxia ([place]) -Presented with worsening hypoxia, new infiltrates on CXR in the setting of ILD.  Followed at [facility] with consideration for lung transplant however not at goal weight.  She recently had prednisone decreased from 10 mg to 7 mg daily and has had progressive dyspnea. -Weaning O2, currently tolerating 6 L O2 HFNC, wean O2 to 4 L today if patient is -Per pulmonology, continue 60 mg prednisone on until O2 demand improved, then taper to 40 mg on discharge and slow taper to baseline of 10 mg. -Continue home atovaquone, hydroxychloroquine, Tyavso, mycophenolate -Received IV Lasix on 9/15, negative balance of 3.9  L -Needing BiPAP every night, per pulmonology will need sleep study outpatient.  Also try 1-2 nights off BiPAP with ABG, possibly will qualify for home BiPAP or CPAP  Active problems Chronic right heart failure (H[facility])  - Dr Loleta Books discussed with CHF team, underwent trial of milrinone with no significant improvement -Continue torsemide 40 mg daily, received Lasix 40 mg IV x1 on 9/15 -Continue to monitor I's and O's, negative balance of 3.9 L   Hypokalemia, hypomagnesemia -Potassium 2.9, replaced oral and IV  OSA -Continue BiPAP at night, - patient reports she does not have BiPAP at home, see #1  Pulmonary hypertension ([place]) --Types 1 and 3 PAH. Continue Tyvaso   Systemic lupus erythematosus with lung involvement ([place]) -Stopped Myfortic 2 months ago.  She was tapering down from prednisone, just prior to onset of symptoms, had gone from 10 mg daily to 7.5.  G6PD normal. -Continue prednisone 60 mg, plan to taper to 40 mg on discharge and slow taper down to baseline dose of 10 mg daily  Steroid-induced hyperglycemia -CBGs stable, continue SSI   Obesity  Estimated body mass index is 34.81 kg/m as calculated from the following:   Height as of this encounter: _0  (1.651 m).   Weight as of this encounter: 94.9 kg. -Barrier for lung pretransplant  Code Status: Full CODE STATUS DVT Prophylaxis:    Lovenox   Level of Care: Level of care: Stepdown Family Communication: Updated patient's mother at the bedside today  Disposition Plan:      Remains inpatient appropriate: severity of illness, overnight on BiPAP   Procedures:  2D echo 9/7-LVEF 55-60%, no WMA.  Indeterminate diastolic parameters.  RV systolic function was moderately reduced, tricuspid regurgitation signal is inadequate for assessing PA pressure.  Consultants: Pulmonology   Antimicrobials:   Anti-infectives (From admission, onward)    Start     Dose/Rate Route Frequency Ordered Stop   03/17/22 1000  azithromycin  (ZITHROMAX) 500 mg in sodium chloride 0.9 % 250 mL IVPB        500 mg 250 mL/hr over 60 Minutes Intravenous Every 24 hours 03/16/22 1437 03/18/22 1157   03/17/22 0600  aztreonam (AZACTAM) 2 g in sodium chloride 0.9 % 100 mL IVPB  Status:  Discontinued        2 g 200 mL/hr over 30 Minutes Intravenous Every 8 hours 03/16/22 1437 03/19/22 1700   03/15/22 1100  levofloxacin (LEVAQUIN) IVPB 750 mg  Status:  Discontinued        750 mg 100 mL/hr over 90 Minutes Intravenous Every 24 hours 03/15/22 0926 03/16/22 1417   03/15/22 0800  atovaquone (MEPRON) 750 MG/5ML suspension 1,500 mg        1,500 mg Oral Daily with breakfast 03/14/22 1251     03/14/22 2200  hydroxychloroquine (PLAQUENIL) tablet 200 mg        200 mg Oral 2 times daily 03/14/22 1436     03/14/22 1000  levofloxacin (LEVAQUIN) IVPB 750 mg  Status:  Discontinued        750 mg 100 mL/hr over 90 Minutes Intravenous  Once 03/14/22 0958 03/14/22 1900          Medications  acetaZOLAMIDE  250 mg Oral BID   aspirin EC  81 mg Oral Daily   atovaquone  1,500 mg Oral Q breakfast   Chlorhexidine Gluconate Cloth  6 each Topical Daily   enoxaparin (LOVENOX) injection  50 mg Subcutaneous Daily   famotidine  20 mg Oral BID   hydroxychloroquine  200 mg Oral BID   insulin aspart  0-9 Units Subcutaneous TID WC   mycophenolate  720 mg Oral BID   mouth rinse  15 mL Mouth Rinse 4 times per day   polyethylene glycol  17 g Oral Daily   predniSONE  60 mg Oral Q breakfast   sodium chloride flush  10-40 mL Intracatheter Q12H   torsemide  20 mg Oral BID   Treprostinil  72 mcg Inhalation QID   Vitamin D (Ergocalciferol)  50,000 Units Oral Weekly      Subjective:   Krimson Shearon was seen and examined today.  On BiPAP on my exam this AM, tolerated O2 at 6 L, weaned yesterday from 10->8-> 6L HFNC.  Mother at the bedside.  No fevers, chest pain, acute shortness of breath.   Objective:   Vitals:   03/25/22 0344 03/25/22 0400 03/25/22 0500  03/25/22 0904  BP:  (!) 126/59    Pulse:  (!) 103 100   Resp:  (!) 28 (!) 29   Temp: 98.8 F (37.1 C)  TempSrc: Axillary     SpO2:  100% 100% 99%  Weight:      Height:        Intake/Output Summary (Last 24 hours) at 03/25/2022 1029 Last data filed at 03/24/2022 1053 Gross per 24 hour  Intake 10 ml  Output --  Net 10 ml     Wt Readings from Last 3 Encounters:  03/20/22 94.9 kg  11/13/21 97.1 kg  10/16/21 107 kg   Physical Exam Franco: Alert and oriented x 3, NAD, on BiPAP Cardiovascular: S1 S2 clear, RRR.  Respiratory: Bibasilar dry crackles Gastrointestinal: Soft, nontender, nondistended, NBS Ext: no pedal edema bilaterally Neuro: no new deficits Skin: No rashes Psych: Normal affect and demeanor, alert and oriented x3      Data Reviewed:  I have personally reviewed following labs    CBC Lab Results  Component Value Date   WBC 20.4 (H) 03/25/2022   RBC 4.07 03/25/2022   HGB 10.7 (L) 03/25/2022   HCT 35.5 (L) 03/25/2022   MCV 87.2 03/25/2022   MCH 26.3 03/25/2022   PLT 361 03/25/2022   MCHC 30.1 03/25/2022   RDW 16.5 (H) 03/25/2022   LYMPHSABS 0.7 03/14/2022   MONOABS 1.5 (H) 03/14/2022   EOSABS 0.5 03/14/2022   BASOSABS 0.1 83/01/3542     Last metabolic panel Lab Results  Component Value Date   NA 136 03/25/2022   K 2.9 (L) 03/25/2022   CL 98 03/25/2022   CO2 31 03/25/2022   BUN 18 03/25/2022   CREATININE 0.66 03/25/2022   GLUCOSE 81 03/25/2022   GFRNONAA >60 03/25/2022   GFRAA 153 01/15/2020   CALCIUM 8.4 (L) 03/25/2022   PHOS 3.5 03/22/2022   PROT 5.9 (L) 03/22/2022   ALBUMIN 2.8 (L) 03/22/2022   LABGLOB 4.1 01/15/2020   AGRATIO 0.9 (L) 01/15/2020   BILITOT 0.4 03/22/2022   ALKPHOS 39 03/22/2022   AST 14 (L) 03/22/2022   ALT 12 03/22/2022   ANIONGAP 7 03/25/2022    CBG (last 3)  Recent Labs    03/24/22 1120 03/24/22 1649 03/24/22 2120  GLUCAP 172* 176* 130*        Andron Marrazzo M.D. [facility] 03/25/2022,  10:29 AM  Available via Epic secure chat 7am-7pm After 7 pm, please refer to night coverage provider listed on amion.

## 2022-03-25 NOTE — Progress Notes (Addendum)
PT refused ABG at 0855- RN aware. RT shared with PT education and importance of treating OSA (has documented OSA in Epic dated 08/25/21.

## 2022-03-25 NOTE — Telephone Encounter (Signed)
Patient seen in the hospital. Please order split-night sleep study for OSA

## 2022-03-25 NOTE — Progress Notes (Signed)
NAME:  Besan Ketchem, MRN:  [id number], DOB:  April 13, 2001, LOS: 70 ADMISSION DATE:  03/14/2022, CONSULTATION DATE:  03/14/22 REFERRING MD:  Olevia Bowens CHIEF COMPLAINT:  Dyspnea   History of Present Illness:  Terrilee Dudzik is a 21 y.o. female who has a PMH as outlined below including but not limited to [facility] and SLE related ILD (followed at [facility] and has had mention of transplant consideration; however, not at goal weight yet). She uses 3 L of oxygen at rest and up to 10 L with activity. She has been managed with Myfortic starting in May 2022 along with prednisone and was switched to Cytoxan. Recently, she was started on Tyvaso. Per her last notes from [facility] (dated 12/20/21), she is currently on Atovaquone Hydroxychloroquine, Mycophenolate, Prednisone.  She presented to [facility] ED 9/6 with worsening dyspnea. She apparently had her Prednisone decreased from 52m to 7.578mdaily 3 - 4 weeks ago. Since then she has had progressive dyspnea. [facility] and was given Lasix and a dose of abx.  In ED today, she was noted to have distress and hypoxia. She was placed on BiPAP which she initially declined but later agreed to. She received [id number] and has yet to have any UOP. CXR showed bilateral ASD concerning for edema vs infection along with chronic interstitial changes with honeycombing.  She continued to have respiratory rates in the 40's and 50's; hence, PCCM called in consultation.   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 female seen for SLE, a high-risk disease with multiple issues. SLE (2018) +ANA, +Sm, +RNP, +Ro, +dsDNA, ACA IgA 15, +coombs hemoyltic anemia, +lupus anticoagulant. pericardial effusion s/p window, ILD with fibrosis. systolic dysfunction (EF 50%28%MTX, Cellcept, Myfortic (2022-), Benlysta, HCQ. nexplanon. Cytoxan (12/202-11/2021). Rituxan 1000m54mxw 01/2202.    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 Has also had right sided heart failure. Now s/p Cytoxan, followed by recent Rituxan. Recently in OSH with increased SOB with increased edema and hypokalemia, diuretics given and fluid improved, also started on abx Levaquin. Will continue prednisone taper as this may be contributing. Once she completes antibitoics can resume Myfortic.     Significant Hospital Events: Including procedures, antibiotic start and stop dates in addition to other pertinent events   9/6 admit 9.8 - Remained on BiPAP overnight. Back to [facility] AM but desaturated during Tyvaso treatment. 1300 UOP overnight, net 2.2L. Feels about the same.. IPAL: Full code, started milirnone empiric. PICC Placed 9/9 - NSVTACh this morning on 0.25 of milrinone and stopped. On 65% 25L HFNC. BiPAP Qhs with 2 application in day/ Afebrile/ Is -1.5L Negative. Coox 90 on milrinone. Unable to lie flat due to desats. Currently pulse ox 95%. Havin some nausea after food and is constipated . Mom and aunt at bedside. Levaquin changed to Azactaim  yesterday due to vaginal itch 9/10 - coox yesterday off milrinon was > 70. PCT < 0.1 STill on HHFNC just dropped to 50% fio2 and 25L [place]. 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 Some better  per [facility] MD. Doing biPAp x 2 in daay and also QHS. Completed IV solumedrol. Started 58m prednisone today. Tyvasoc continues. Mom at beside. Afebrile 9/11 Feeling clinically improved, weaning HFNC 9/12 bed weight up, pt feeling edematous, able to wean to 15L HFNC  yesterday 9/13 remains on HFNC, mother at bedside  9/15 Transition from heated high flow to salter HFNC 10 L 9/16 weaned to 6 L nasal cannula  Interim History / Subjective:   Continues to require BiPAP overnight. Tolerated to 6 L nasal cannula daytime Afebrile  Objective:  Blood pressure (!) 126/59, pulse 100, temperature 98.8 F (37.1 C), temperature source Axillary, resp. rate (!) 29, height 5' 5" (1.651 m), weight 94.9 kg, SpO2 100 %.    FiO2 (%):  [40 %] 40 %   Intake/Output Summary (Last 24 hours) at 03/25/2022 0855 Last data filed at 03/24/2022 1053 Gross per 24 hour  Intake 10 ml  Output --  Net 10 ml    Filed Weights   03/19/22 0500 03/20/22 0500 03/20/22 1700  Weight: 100.8 kg 100.5 kg 94.9 kg   Physical Exam: General:  chronic ill appearing adult female being up in bed, on BiPAP, no distress HEENT: [place]/AT, MM pink/moist, PERRL,  Neuro: Alert oriented x3, no tremors, no asterixis CV: s1s2 regular rate and rhythm, no murmur, rubs, or gallops,  PULM: No accessory muscle use, bibasal dry crackles GI: soft, bowel sounds active in all 4 quadrants, non-tender, non-distended, tolerating oral diet Extremities: warm/dry, no edema  Skin: no rashes or lesions  Labs show stable leukocytosis, hypokalemia  Assessment & Plan:   Acute on chronic hypoxic respiratory failure -  -Secondary to baseline pulmonary hypertension and ILD iwith  honeycombing n the setting of SLE.  High concern for ILD flare in the contex of outpatient pred taper.  Less evidence for decompensated cor pulmonale (milrinone did not help and did not tolerate), BNP nromal, Coox off milrinone normal) -Considered [facility] transfer, however is improving and not a candidate for lung transplant currently until weight at goal -Pt [facility] but will reschedule following discharge P: Tolerating salter high flow nasal cannula at 6 L, decrease as tolerated Continue 630mPrednisone  until oxygen demand improves, can drop to 40 mg on discharge and slow taper down to baseline dose of 10 mg Continue home Atovaquone, Hydroxychloroquine, and Tyavso resumed home Mycophenolate  Needing BiPAP every night, check ABG on nasal cannula to see if she has hypercarbia as a qualifying condition, we will set up sleep studies as outpatient If she does not qualify for BiPAP , we should try at least 1-2 nights off BiPAP in the hospital prior to discharge  Hypokalemia -repleted  Steroid induced Hyperglycemia P: Continue SSI   She has made good progress  Best practice (evaluated daily):  Per primary team.    RaKara MeadD. FCCP. [place] Pager : [phone]  If no response to pager , please call [phone] until 7 pm After 7:00 pm call Elink  [phone]    03/25/2022, 8:55 AM

## 2022-03-26 DIAGNOSIS — J9621 Acute and chronic respiratory failure with hypoxia: Secondary | ICD-10-CM | POA: Diagnosis not present

## 2022-03-26 LAB — CBC
HCT: 34.2 % — ABNORMAL LOW (ref 36.0–46.0)
Hemoglobin: 10.3 g/dL — ABNORMAL LOW (ref 12.0–15.0)
MCH: 26.3 pg (ref 26.0–34.0)
MCHC: 30.1 g/dL (ref 30.0–36.0)
MCV: 87.2 fL (ref 80.0–100.0)
Platelets: 308 10*3/uL (ref 150–400)
RBC: 3.92 MIL/uL (ref 3.87–5.11)
RDW: 16.7 % — ABNORMAL HIGH (ref 11.5–15.5)
WBC: 17.8 10*3/uL — ABNORMAL HIGH (ref 4.0–10.5)
nRBC: 0 % (ref 0.0–0.2)

## 2022-03-26 LAB — BASIC METABOLIC PANEL WITH GFR
Anion gap: 6 (ref 5–15)
BUN: 13 mg/dL (ref 6–20)
CO2: 33 mmol/L — ABNORMAL HIGH (ref 22–32)
Calcium: 8.1 mg/dL — ABNORMAL LOW (ref 8.9–10.3)
Chloride: 100 mmol/L (ref 98–111)
Creatinine, Ser: 0.54 mg/dL (ref 0.44–1.00)
GFR, Estimated: >60 mL/min
Glucose, Bld: 89 mg/dL (ref 70–99)
Potassium: 3.3 mmol/L — ABNORMAL LOW (ref 3.5–5.1)
Sodium: 139 mmol/L (ref 135–145)

## 2022-03-26 LAB — GLUCOSE, CAPILLARY: Glucose-Capillary: 86 mg/dL (ref 70–99)

## 2022-03-26 LAB — MAGNESIUM: Magnesium: 2 mg/dL (ref 1.7–2.4)

## 2022-03-26 MED ORDER — PREDNISONE 20 MG PO TABS
50.0000 mg | ORAL_TABLET | Freq: Every day | ORAL | Status: DC
  Administered 2022-03-26: 50 mg via ORAL
  Filled 2022-03-26: qty 1

## 2022-03-26 MED ORDER — POTASSIUM CHLORIDE CRYS ER 20 MEQ PO TBCR
40.0000 meq | EXTENDED_RELEASE_TABLET | Freq: Once | ORAL | Status: AC
  Administered 2022-03-26: 40 meq via ORAL
  Filled 2022-03-26: qty 2

## 2022-03-26 MED ORDER — PREDNISONE 20 MG PO TABS: ORAL_TABLET | ORAL | 0 refills | Status: DC

## 2022-03-26 NOTE — Progress Notes (Signed)
BIPAP is on standby at this time. Patient is tolerating 5L [place] with no distress. RT will continue to monitor

## 2022-03-26 NOTE — Progress Notes (Signed)
See palliative care consultation note dated 9/12 for recommendations and discharge planning needs. Recommend referral for community based palliative care services and support. Chart reviewed for progression, no acute inpatient palliative care needs at this time. Please re-consult if her condition changes or we can provide additional support.  Lane Hacker, DO Palliative Medicine

## 2022-03-26 NOTE — Progress Notes (Addendum)
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 Volume Ventilation with Trilogy is indicated.  Ordering Trilogy with AVAPS-AE for use during hours of sleep as well as during waking hours when symptomatic.  Home BIPAP is NOT appropriate for meeting this patient's ventilatory requirements and does not work for this patient. Ordering Trilogy NIV with AVAPS-AE as opposed to BIPAP with Backup Rate and AVAPS (BIPAP- AVAPS) for the reasons below: With BIPAP- AVAPS flow capability is extremely limited (1/3 of what the Trilogy is capable of achieving). Trilogy with AVAPS-AE is preferred due to Auto Adjusting EPAP function allowing for continued upper airway patency throughout use. Trilogy with AVAPS-AE has dual prescription modality allowing for seamless transition between modes when changes in the patient's clinical condition requires. Trilogy is equipped with a backup battery allowing for continued use with power outages.  Rhonda Franco presents with obesity hypoventilation syndrome that is causing thoracic restrictive disorder. The use of the NIV will treat patient's high PCO2 levels (45 with an elevated bicarb of 33.5 on 03/25/22 after using BiPAP) and can reduce risk of exacerbations and future hospitalizations when used at night and during the day. All alternate devices [phone] and [id number], specifically BiPAP ST 18/6 with a backup rate of 10) have been proven ineffective to provide essential volume control necessary to maintain an appropriate volume for the patient's lungs. An NIV with volume-targeted pressure support is necessary to prevent patient from life-threatening harm. Interruption or failure to provide NIV would quickly lead to exacerbation of the patient's condition, hospital re-admission, and likely harm to the patient. Continued use is preferred. Patient is able to protect their airways and clear secretions on their own.   [place]

## 2022-03-26 NOTE — Progress Notes (Signed)
Patient is leaving AMA, prednisone called to her pharmacy per Dr. Cruzita Lederer. Paper work signed by patient, IV consultant requested to remove patient's PICC line.

## 2022-03-26 NOTE — Progress Notes (Signed)
NAME:  Rhonda Franco, MRN:  [id number], DOB:  [birth year], LOS: 11 ADMISSION DATE:  03/14/2022, CONSULTATION DATE:  03/14/22 REFERRING MD:  Olevia Bowens CHIEF COMPLAINT:  Dyspnea   History of Present Illness:  Rhonda Franco is a 21 y.o. female who has a PMH as outlined below including but not limited to [facility] and SLE related ILD (followed at [facility] and has had mention of transplant consideration; however, not at goal weight yet). She uses 3 L of oxygen at rest and up to 10 L with activity. She has been managed with Myfortic starting in May 2022 along with prednisone and was switched to Cytoxan. Recently, she was started on Tyvaso. Per her last notes from [facility] (dated 12/20/21), she is currently on Atovaquone Hydroxychloroquine, Mycophenolate, Prednisone.  She presented to [facility] ED 9/6 with worsening dyspnea. She apparently had her Prednisone decreased from 22m to 7.524mdaily 3 - 4 weeks ago. Since then she has had progressive dyspnea. [facility] and was given Lasix and a dose of abx.  In ED today, she was noted to have distress and hypoxia. She was placed on BiPAP which she initially declined but later agreed to. She received 407masix and has yet to have any UOP. CXR showed bilateral ASD concerning for edema vs infection along with chronic interstitial changes with honeycombing.  She continued to have respiratory rates in the 40's and 50's; hence, PCCM called in consultation.   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 female seen for SLE, a high-risk disease with multiple issues. SLE (2018) +ANA, +Sm, +RNP, +Ro, +dsDNA, ACA IgA 15, +coombs hemoyltic anemia, +lupus anticoagulant. pericardial effusion s/p window, ILD with fibrosis. systolic dysfunction (EF 50%23%MTX, Cellcept, Myfortic (2022-), Benlysta, HCQ. nexplanon. Cytoxan (12/202-11/2021). Rituxan 1000m42mxw 01/2202.    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 Has also had right sided heart failure. Now s/p Cytoxan, followed by recent Rituxan. Recently in OSH with increased SOB with increased edema and hypokalemia, diuretics given and fluid improved, also started on abx Levaquin. Will continue prednisone taper as this may be contributing. Once she completes antibitoics can resume Myfortic.     Significant Hospital Events: Including procedures, antibiotic start and stop dates in addition to other pertinent events   9/6 admit 9.8 - Remained on BiPAP overnight. Back to [facility] AM but desaturated during Tyvaso treatment. 1300 UOP overnight, net 2.2L. Feels about the same.. IPAL: Full code, started milirnone empiric. PICC Placed 9/9 - NSVTACh this morning on 0.25 of milrinone and stopped. On 65% 25L HFNC. BiPAP Qhs with 2 application in day/ Afebrile/ Is -1.5L Negative. Coox 90 on milrinone. Unable to lie flat due to desats. Currently pulse ox 95%. Havin some nausea after food and is constipated . Mom and aunt at bedside. Levaquin changed to Azactaim  yesterday due to vaginal itch 9/10 - coox yesterday off milrinon was > 70. PCT < 0.1 STill on HHFNC just dropped to 50% fio2 and 25L [place]. 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 Some better  per [facility] MD. Doing biPAp x 2 in daay and also QHS. Completed IV solumedrol. Started 17m prednisone today. Tyvasoc continues. Mom at beside. Afebrile 9/11 Feeling clinically improved, weaning HFNC 9/12 bed weight up, pt feeling edematous, able to wean to 15L HFNC  yesterday 9/13 remains on HFNC, mother at bedside  9/15 Transition from heated high flow to salter HFNC 10 L 9/16 weaned to 6 L nasal cannula  Interim History / Subjective:  Has continued BiPAP overnight. Just placed on [place] at 5L this AM. She states she feels as if she is near her baseline.  Objective:  Blood pressure 126/64, pulse 100, temperature 97.8 F (36.6 C), temperature source Axillary, resp. rate (!) 31, height _0  (1.651 m), weight 94.9 kg, SpO2 99 %.    FiO2 (%):  [40 %] 40 %   Intake/Output Summary (Last 24 hours) at 03/26/2022 0740 Last data filed at 03/25/2022 2233 Gross per 24 hour  Intake 210 ml  Output --  Net 210 ml    Filed Weights   03/19/22 0500 03/20/22 0500 03/20/22 1700  Weight: 100.8 kg 100.5 kg 94.9 kg   Physical Exam: General:  chronic ill appearing young adult female, resting in bed, no distress. HEENT: [place]/AT, MM pink/moist, EOMI.  Neuro: Alert oriented x3, no tremors, no asterixis. CV: RRR, no M/R/G. PULM: Faint basilar crackles. GI: soft, bowel sounds active in all 4 quadrants, non-tender, non-distended, tolerating oral diet. Extremities: warm/dry, no edema.  Skin: no rashes or lesions.   Assessment & Plan:   Acute on chronic hypoxic respiratory failure -  -Secondary to baseline pulmonary hypertension and ILD iwith  honeycombing n the setting of SLE.  High concern for ILD flare in the contex of outpatient pred taper.  Less evidence for decompensated cor pulmonale (milrinone did not help and did not tolerate), BNP nromal, Coox off milrinone normal) -Considered [facility] transfer, however is improving and not a candidate for lung transplant currently until weight at goal -[facility] but will res[facility]dule following discharge P: Tolerating salter high flow nasal cannula at 5L, decrease as tolerated Continue 655mPrednisone until oxygen demand improves a bit further (around 3-4L), then can drop to 40 mg on  discharge and slow taper down to baseline dose of 10 mg Continue home Atovaquone, Hydroxychloroquine, and Tyavso resumed home Mycophenolate  Has needed BiPAP every night. ABG 9/17 on nasal cannula did not demonstrate any hypercarbia. If she can go overnight without BiPAP, Would try to get a morning ABG  to assess whether she has hypercarbia as a qualifying condition, we will set up sleep studies as outpatient. If she does not qualify for BiPAP , we should try at least 1-2 nights off BiPAP in the hospital prior to discharge.  Steroid induced Hyperglycemia P: Continue SSI   She is making steady progress. Rest per primary team.  Best practice (evaluated daily):  Per primary team.   RaMontey HoraPASnookulmonary & Critical Care Medicine For pager details, please see AMION or use Epic chat  After 1900, please call ELKu [facility] cross coverage needs 03/26/2022, 7:51 AM

## 2022-03-26 NOTE — Progress Notes (Signed)
PT Cancellation Note  Patient Details Name: Rhonda Franco MRN: [id number] DOB: Oct 09, 2000   Cancelled Treatment:    Reason Eval/Treat Not Completed: (P) Patient declined, no reason specified. Pt reporting she is ready to go home and is considering discharging herself AMA; counseled pt and mother regarding the impacts of this decision, pt and mother reporting they understand. Educated pt on pacing of activity, prioritization of daily tasks, using a schedule, and finding small moments to complete generalized exercise program. Pt did have specific questions about pulmonary exercises, would appreciate respiratory input. We will continue to follow if pt remains in acute care.   Coolidge Breeze, PT, DPT [facility] Rehabilitation Department Office: [phone]  Coolidge Breeze 03/26/2022, 11:10 AM

## 2022-03-26 NOTE — TOC Initial Note (Signed)
Transition of Care [facility]) - Initial/Assessment Note    Patient Details  Name: Rhonda Franco MRN: [id number] Date of Birth: 03/15/2001  [facility] CM/SW Contact:    Servando Snare, LCSW Phone Number: 03/26/2022, 10:06 AM  Clinical Narrative: TOC following for for dc needs. Patient to dc home with trilogy. Awaiting insurance authorization for trilogy.                   Expected Discharge Plan: Home/Self Care Barriers to Discharge: Insurance Authorization   Patient Goals and CMS Choice Patient states their goals for this hospitalization and ongoing recovery are:: Go home   Choice offered to / list presented to : NA  Expected Discharge Plan and Services Expected Discharge Plan: Home/Self Care In-house Referral: NA Discharge Planning Services: NA Post Acute Care Choice: Durable Medical Equipment Living arrangements for the past 2 months: Single Family Home                 DME Arranged: NIV DME Agency: [employer] Date DME Agency Contacted: 03/26/22 Time DME Agency Contacted: 1005 Representative spoke with at DME Agency: [place]: NA [place] Agency: NA        Prior Living Arrangements/Services Living arrangements for the past 2 months: [place] with:: Parents Patient language and need for interpreter reviewed:: Yes Do you feel safe going back to the place where you live?: Yes      Need for Family Participation in Patient Care: Yes (Comment) Care giver support system in place?: Yes (comment) Current home services: DME Criminal Activity/Legal Involvement Pertinent to Current Situation/Hospitalization: No - Comment as needed  Activities of Daily Living Home Assistive Devices/Equipment: Oxygen ADL Screening (condition at time of admission) Patient's cognitive ability adequate to safely complete daily activities?: No Is the patient deaf or have difficulty hearing?: No Does the patient have difficulty seeing, even when wearing  glasses/contacts?: No Does the patient have difficulty concentrating, remembering, or making decisions?: No Patient able to express need for assistance with ADLs?: Yes Does the patient have difficulty dressing or bathing?: No Independently performs ADLs?: Yes (appropriate for developmental age) Does the patient have difficulty walking or climbing stairs?: No Weakness of Legs: None Weakness of Arms/Hands: None  Permission Sought/Granted   Permission granted to share information with : No              Emotional Assessment Appearance:: Appears stated age Attitude/Demeanor/Rapport: Unable to Assess Affect (typically observed): Unable to Assess Orientation: : Oriented to Self, Oriented to Place, Oriented to  Time, Oriented to Situation Alcohol / Substance Use: Not Applicable Psych Involvement: No (comment)  Admission diagnosis:  Acute pulmonary edema (HCC) [J81.0] Acute on chronic respiratory failure with hypoxia (HCC) [J96.21] Patient Active Problem List   Diagnosis Date Noted   Hypokalemia 03/19/2022   Chronic right heart failure ([place]) 03/19/2022   Hypoalbuminemia 03/15/2022   Acute on chronic respiratory failure with hypoxia ([place]) 03/14/2022   Obesity (BMI 30-39.9) 03/14/2022   Pulmonary hypertension ([place]) 10/21/2021   Acute right ventricular heart failure ([place]) 10/20/2021   Dysplasia of cervix, 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   Systemic lupus erythematosus with lung involvement ([place]) 12/16/2020   Atypical chest pain 09/15/2020   COVID-19 vaccine series completed 06/16/2020   Elevated blood pressure reading without diagnosis of hypertension 06/16/2020   Class 3 severe obesity due to excess calories without serious comorbidity with body mass index (BMI) of 40.0 to 44.9 in adult ([place]) 08/17/2019   Interstitial lung  disease due to connective tissue disease ([place]) 05/18/2019   Myositis 02/10/2019   BMI 40.0-44.9, adult ([place]) 06/18/2018   Vaginal discharge 06/18/2018   High risk  heterosexual behavior 06/18/2018   High risk social situation 04/27/2017   Systemic lupus erythematosus, unspecified ([place]) 03/17/2017   Red blood cell antibody positive 03/12/2017   Pericardial effusion 03/05/2017   Acanthosis nigricans 08/30/2015   [place] fibroma 11/05/2012   Sleep apnea 08/25/2008   Keloid scar 08/25/2008   Family history of diabetes mellitus 07/29/2006   Allergic rhinitis 07/29/2006   PCP:  Pcp, No Pharmacy:   [facility] [place], [place] - [phone] [place] [address] Phone: [phone] Fax: [phone]     Social Determinants of Health (SDOH) Interventions    Readmission Risk Interventions     No data to display

## 2022-03-26 NOTE — Progress Notes (Signed)
Chaplain engaged in an initial visit, spending time getting to know Rhonda Franco.  Rhonda Franco described herself as an introvert that values spending time with herself, having times of silence, utilizing music therapy, and practicing her faith through prayer.  Rhonda Franco loves gospel and R&B music.  She also described herself as being "old school" in the ways she values going out to dinner or brunch, getting her nails and feet done, and then being able to go home.  She doesn't see herself as the average 23 year old in her desire to enjoy her own space and home.    Chaplain and Rosemarie also spent some time talking about the ways Lupus has changed Rhonda Franco life. Rhonda Franco is the only one in her family to have Lupus outside of her paternal family which she does not know.  She has had to learn her body and how to respond through a lot of trial and error situations.  For example, she learned to recognize that she can't be in the sun for too long.  She also learned to have different thinking around needing rest and having fatigue.  She understands that she isn't "lazy" but that she needs to allow her body to recuperate.  Rhonda Franco voiced now being a place of knowing that she has to act faster by calling her doctor or going to the hospital if something feels off in her body.    Rhonda Franco was also able to relay what feels important to her while being hospitalized.  She values relationship and her care providers knowing her medical history.  Rhonda Franco stated that her doctors reside in [place] and that she is more comfortable going to [facility] medical system because they know her.  Rhonda Franco desires for her medical providers to understand her baseline and needs.    Chaplain wanted to provide a safe space for Rhonda Franco to share about herself.  Chaplain offered listening, presence, and support.     03/26/22 1200  Clinical Encounter Type  Visited With Patient  Visit Type Initial

## 2022-03-26 NOTE — Progress Notes (Signed)
Patient requesting to leave AMA, Dr. Cruzita Lederer notified.

## 2022-03-26 NOTE — Discharge Summary (Signed)
Physician Discharge AGAINST MEDICAL ADVICE summary  Rhonda Franco ZOX:[id number] DOB: Jan 22, 2001 DOA: 03/14/2022  PCP: Pcp, No  Admit date: 03/14/2022 Discharge date: 03/26/2022  Admitted From: Home Disposition: Left AMA  Recommendations for Outpatient Follow-up:  Follow up with pulmonology ASAP  CODE STATUS: Full code  HPI: Per admitting MD, Rhonda Franco is a 21 y.o. 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  She is currently on atovaquone, hydroxychloroquine and prednisone 7.5 mg p.o. daily.  Prednisone being tapered down from 10 mg daily for the past week.  She was recently started on Tyvaso.  She is unable to provide further history due to being on BiPAP ventilation due to respiratory distress.  Hospital Course / Discharge diagnoses: Principal Problem:   Acute on chronic respiratory failure with hypoxia (HCC) Active Problems:   Systemic lupus erythematosus with lung involvement (HCC)   Pulmonary hypertension (HCC)   Sleep apnea   Obesity (BMI 30-39.9)   Hypoalbuminemia   Hypokalemia   Chronic right heart failure (HCC)   Principal problem Acute on chronic respiratory failure with hypoxia (HCC) -Presented with dyspnea, worsening hypoxia, and new infiltrates on chest imaging.  Suspect ILD flare and pulmonary was consulted.  She is followed at [facility] with consideration for lung transplant, however she is not at goal weight.  She recently had prednisone decreased from 10 mg to 7 mg daily, and since then she has had progressive dyspnea.  There was a concern about decompensated right heart  failure, and she was briefly on milrinone which has not helped.  She eventually improved, but was found to require BiPAP every night.  Pulmonary started the process of obtaining home trilogy for patient.  She has returned close to baseline, on 5 L at rest and up to 10 L with ambulation.  Today, patient unfortunately wants to leave the hospital AMA.  From medical standpoint she is not ready as she has not trialed sleeping at night without positive pressure. Patient has been warned that this is not Medically advisable at this time, and can result in Medical complications like Death and Disability, patient understands and accepts the risks involved and assumes full responsibilty of this decision.  During my discussion with her her mother was at bedside.  Continue very slow prednisone taper as below, resume home medications otherwise   Active problems Chronic right heart failure (HCC) -resume home medications on discharge  Hypokalemia -repleted  Hypoalbuminemia -noted Obesity (BMI 30-39.9) -BMI 34, apparently this is the major barrier for lung transplant Sleep apnea -Continue BiPAP at night, trilogy is being worked up with pulmonary but she did not want to stay in the hospital to trial a night off BiPAP.  Pulmonary hypertension (HCC) -Types 1 and 3 PAH. Continue home regimen and outpatient follow-up Systemic lupus erythematosus with lung involvement ([place]) -continue home regimen outpatient follow-up  Sepsis ruled out   Discharge Instructions   Allergies as of 03/26/2022       Reactions   Ampicillin Anaphylaxis, Swelling, Palpitations, Rash   Keflex [cephalexin] Anaphylaxis, Swelling, Other (See Comments)   Throat swelling   Penicillins Anaphylaxis, Swelling, Palpitations, Rash   Has patient had a  PCN reaction causing immediate rash, facial/tongue/throat swelling, SOB or lightheadedness with hypotension: Yes Has patient had a PCN reaction causing severe rash involving mucus membranes or skin  necrosis: Yes Has patient had a PCN reaction that required hospitalization: Yes Has patient had a PCN reaction occurring within the last 10 years: Yes If all of the above answers are "NO", then may proceed with Cephalosporin use.   Bactrim [sulfamethoxazole-trimethoprim] Nausea Only, Rash   Levaquin [levofloxacin] Itching   Vaginal itching     Med Rec must be completed prior to using this [place]   Consultations: Pulmonary   Procedures/Studies:  DG CHEST PORT 1 VIEW  Result Date: 03/18/2022 CLINICAL DATA:  Follow-up for airspace disease and dyspnea. EXAM: PORTABLE CHEST 1 VIEW COMPARISON:  03/16/2022 FINDINGS: 5:59 a.m. basal predominant fibrosis and honeycombing is present with patchy bilateral superimposed airspace disease, sparing of the right base. There is mild improvement in aeration in the left upper to mid lung field with somewhat less confluence of the opacities. Remainder of the bilateral infiltrates are unchanged. No pleural collection is seen. The cardiac size is normal. Interval right PICC insertion with tip at the superior cavoatrial junction. IMPRESSION: Bilateral airspace disease with mild improvement left upper to mid lung field. No new or worsening infiltrate. Electronically Signed   By: Telford Nab M.D.   On: 03/18/2022 07:38   [place] EKG SITE RITE  Result Date: 03/16/2022 If Site Rite image not attached, placement could not be confirmed due to current cardiac rhythm.  DG Chest Port 1 View  Result Date: 03/16/2022 CLINICAL DATA:  [phone] with respiratory failure. EXAM: PORTABLE CHEST 1 VIEW COMPARISON:  AP Lat 03/14/2022. FINDINGS: There is extensive interstitial fibrosis of both lungs with basal predominant honeycombing. Superimposed patchy airspace disease in the left-greater-than-right lung fields appears similar with no overt improvement or worsening. Cardiac size remains normal. The sulci are sharp. IMPRESSION: No interval change in left-greater-than-right airspace  disease. Findings superimposed on extensive chronic change. Electronically Signed   By: Telford Nab M.D.   On: 03/16/2022 06:29   ECHOCARDIOGRAM COMPLETE  Result Date: 03/15/2022    ECHOCARDIOGRAM REPORT   Patient Name:   Rhonda Franco Date of Exam: 03/15/2022 Medical Rec #:  [id number]        Height:       65.0 in Accession #:    [id number]       Weight:       217.0 lb Date of Birth:  April 23, 2001       BSA:          2.048 m Patient Age:    20 years         BP:           102/68 mmHg Patient Gender: F                HR:           114 bpm. Exam Location:  Inpatient Procedure: 2D Echo, Cardiac Doppler and Color Doppler Indications:    Pulmonary embolism  History:        Patient has prior history of Echocardiogram examinations, most                 recent 02/12/2019. Risk Factors:Sleep Apnea.  Sonographer:    Jefferey Pica Referring Phys: [id number] [place]  1. Left ventricular ejection fraction, by estimation, is 55 to 60%. The left ventricle has normal function. The left ventricle has no regional wall motion abnormalities. Left  ventricular diastolic parameters are indeterminate. There is the interventricular septum is flattened in systole and diastole, consistent with right ventricular pressure and volume overload.  2. Right ventricular systolic function is moderately reduced. The right ventricular size is normal. Tricuspid regurgitation signal is inadequate for assessing PA pressure.  3. The mitral valve is normal in structure. Trivial mitral valve regurgitation. No evidence of mitral stenosis.  4. The aortic valve is tricuspid. Aortic valve regurgitation is trivial. No aortic stenosis is present.  5. The inferior vena cava is normal in size with greater than 50% respiratory variability, suggesting right atrial pressure of 3 mmHg. FINDINGS  Left Ventricle: Left ventricular ejection fraction, by estimation, is 55 to 60%. The left ventricle has normal function. The left ventricle has no  regional wall motion abnormalities. The left ventricular internal cavity size was normal in size. There is  no left ventricular hypertrophy. The interventricular septum is flattened in systole and diastole, consistent with right ventricular pressure and volume overload. Left ventricular diastolic parameters are indeterminate. Right Ventricle: The right ventricular size is normal. Right ventricular systolic function is moderately reduced. Tricuspid regurgitation signal is inadequate for assessing PA pressure. The tricuspid regurgitant velocity is 2.61 m/s, and with an assumed right atrial pressure of 5 mmHg, the estimated right ventricular systolic pressure is 23.7 mmHg. Left Atrium: Left atrial size was normal in size. Right Atrium: Right atrial size was normal in size. Pericardium: There is no evidence of pericardial effusion. Mitral Valve: The mitral valve is normal in structure. Trivial mitral valve regurgitation. No evidence of mitral valve stenosis. Tricuspid Valve: The tricuspid valve is normal in structure. Tricuspid valve regurgitation is trivial. No evidence of tricuspid stenosis. Aortic Valve: The aortic valve is tricuspid. Aortic valve regurgitation is trivial. No aortic stenosis is present. Aortic valve peak gradient measures 5.7 mmHg. Pulmonic Valve: The pulmonic valve was normal in structure. Pulmonic valve regurgitation is trivial. No evidence of pulmonic stenosis. Aorta: The aortic root is normal in size and structure. Venous: The inferior vena cava is normal in size with greater than 50% respiratory variability, suggesting right atrial pressure of 3 mmHg. IAS/Shunts: No atrial level shunt detected by color flow Doppler.  LEFT VENTRICLE PLAX 2D LVIDd:         4.70 cm   Diastology LVIDs:         3.10 cm   LV e' lateral: 10.30 cm/s LV PW:         1.00 cm LV IVS:        0.90 cm LVOT diam:     2.10 cm LV SV:         50 LV SV Index:   24 LVOT Area:     3.46 cm  RIGHT VENTRICLE            IVC RV Basal  diam:  2.60 cm    IVC diam: 1.90 cm RV S prime:     8.19 cm/s TAPSE (M-mode): 1.1 cm LEFT ATRIUM           Index        RIGHT ATRIUM           Index LA diam:      3.20 cm 1.56 cm/m   RA Area:     12.90 cm LA Vol (A2C): 30.2 ml 14.75 ml/m  RA Volume:   36.20 ml  17.68 ml/m LA Vol (A4C): 29.6 ml 14.45 ml/m  AORTIC VALVE  PULMONIC VALVE AV Area (Vmax): 2.99 cm     PV Vmax:       0.73 m/s AV Vmax:        119.50 cm/s  PV Peak grad:  2.1 mmHg AV Peak Grad:   5.7 mmHg LVOT Vmax:      103.00 cm/s LVOT Vmean:     59.500 cm/s LVOT VTI:       0.143 m  AORTA Ao Root diam: 3.30 cm Ao Asc diam:  2.90 cm MR Peak grad: 81.0 mmHg   TRICUSPID VALVE MR Vmax:      450.00 cm/s TR Peak grad:   27.2 mmHg                           TR Vmax:        261.00 cm/s                            SHUNTS                           Systemic VTI:  0.14 m                           Systemic Diam: 2.10 cm Kirk Ruths MD Electronically signed by Kirk Ruths MD Signature Date/Time: 03/15/2022/12:53:15 PM    Final    DG Chest 2 View  Result Date: 03/14/2022 CLINICAL DATA:  Shortness of breath.  Lupus.  Home oxygen. EXAM: CHEST - 2 VIEW COMPARISON:  10/16/2021 FINDINGS: Low lung volumes with worsening hazy bilateral airspace opacities against a background of reticular interstitial accentuation representing known honeycombing. Prominent main pulmonary artery likely reflecting pulmonary arterial hypertension. IMPRESSION: 1. Worsening airspace disease in both lungs, nonspecific for infection versus pulmonary edema. 2. Prominent main pulmonary artery compatible with pulmonary arterial hypertension. 3. Reticular interstitial accentuation in the lungs compatible with honeycombing. Electronically Signed   By: Van Clines M.D.   On: 03/14/2022 09:08     Subjective: - no chest pain, shortness of breath, no abdominal pain, nausea or vomiting.   Discharge Exam: BP 126/64   Pulse (!) 125   Temp 98.1 F (36.7 C) (Oral)   Resp (!)  31   Ht _0  (1.651 m)   Wt 94.9 kg   SpO2 99%   BMI 34.81 kg/m   General: Pt is alert, awake, not in acute distress Cardiovascular: RRR, S1/S2 +, no rubs, no gallops Respiratory: CTA bilaterally, no wheezing, no rhonchi Abdominal: Soft, NT, ND, bowel sounds + Extremities: no edema, no cyanosis    The results of significant diagnostics from this hospitalization (including imaging, microbiology, ancillary and laboratory) are listed below for reference.     Microbiology: Recent Results (from the past 240 hour(s))  Respiratory (~20 pathogens) panel by PCR     Status: None   Collection Time: 03/16/22  4:00 PM   Specimen: Nasopharyngeal Swab; Respiratory  Result Value Ref Range Status   Adenovirus NOT DETECTED NOT DETECTED Final   Coronavirus 229E NOT DETECTED NOT DETECTED Final    Comment: (NOTE) The Coronavirus on the Respiratory Panel, DOES NOT test for the novel  Coronavirus (2019 nCoV)    Coronavirus HKU1 NOT DETECTED NOT DETECTED Final   Coronavirus NL63 NOT DETECTED NOT DETECTED Final   Coronavirus OC43 NOT DETECTED NOT DETECTED Final   Metapneumovirus NOT  DETECTED NOT DETECTED Final   Rhinovirus / Enterovirus NOT DETECTED NOT DETECTED Final   Influenza A NOT DETECTED NOT DETECTED Final   Influenza B NOT DETECTED NOT DETECTED Final   Parainfluenza Virus 1 NOT DETECTED NOT DETECTED Final   Parainfluenza Virus 2 NOT DETECTED NOT DETECTED Final   Parainfluenza Virus 3 NOT DETECTED NOT DETECTED Final   Parainfluenza Virus 4 NOT DETECTED NOT DETECTED Final   Respiratory Syncytial Virus NOT DETECTED NOT DETECTED Final   Bordetella pertussis NOT DETECTED NOT DETECTED Final   Bordetella Parapertussis NOT DETECTED NOT DETECTED Final   Chlamydophila pneumoniae NOT DETECTED NOT DETECTED Final   Mycoplasma pneumoniae NOT DETECTED NOT DETECTED Final    Comment: Performed at [facility], [place] [address] [place], [place] [address]     Labs: Basic Metabolic Panel: Recent  Labs  Lab 03/20/22 0500 03/20/22 1418 03/21/22 0545 03/22/22 0430 03/23/22 0741 03/24/22 0228 03/25/22 0630 03/26/22 0322  NA 136  --  136 136 140 138 136 139  K 3.3*   < > 3.1* 2.9* 3.0* 3.6 2.9* 3.3*  CL 93*  --  95* 96* 100 99 98 100  CO2 36*  --  33* 34* 31 33* 31 33*  GLUCOSE 74  --  81 83 97 105* 81 89  BUN 13  --  _0 CREATININE 0.44  --  0.55 0.50 0.58 0.44 0.66 0.54  CALCIUM 8.6*  --  8.5* 8.4* 8.6* 8.6* 8.4* 8.1*  MG 1.7  --  2.2 1.9  --  2.0  --  2.0  PHOS  --   --   --  3.5  --   --   --   --    < > = values in this interval not displayed.   Liver Function Tests: Recent Labs  Lab 03/22/22 0430  AST 14*  ALT 12  ALKPHOS 39  BILITOT 0.4  PROT 5.9*  ALBUMIN 2.8*   CBC: Recent Labs  Lab 03/22/22 0430 03/23/22 0741 03/24/22 0228 03/25/22 0630 03/26/22 0322  WBC 19.0* 19.0* 21.0* 20.4* 17.8*  HGB 10.8* 10.9* 10.7* 10.7* 10.3*  HCT 36.0 35.7* 35.4* 35.5* 34.2*  MCV 85.5 85.8 86.3 87.2 87.2  PLT 350 375 369 361 308   CBG: Recent Labs  Lab 03/25/22 1133 03/25/22 1519 03/25/22 1654 03/25/22 2131 03/26/22 0755  GLUCAP 168* 190* 235* 208* 86   Hgb A1c No results for input(s): "HGBA1C" in the last 72 hours. Lipid Profile No results for input(s): "CHOL", "HDL", "LDLCALC", "TRIG", "CHOLHDL", "LDLDIRECT" in the last 72 hours. Thyroid function studies No results for input(s): "TSH", "T4TOTAL", "T3FREE", "THYROIDAB" in the last 72 hours.  Invalid input(s): "FREET3" Urinalysis    Component Value Date/Time   COLORURINE YELLOW 10/16/2021 [place] 10/16/2021 1441   LABSPEC >=1.030 10/16/2021 1441   PHURINE 6.0 10/16/2021 1441   GLUCOSEU NEGATIVE 10/16/2021 1441   HGBUR NEGATIVE 10/16/2021 1441   BILIRUBINUR NEGATIVE 10/16/2021 1441   BILIRUBINUR small (A) 06/13/2018 0916   KETONESUR NEGATIVE 10/16/2021 1441   PROTEINUR 100 (A) 10/16/2021 1441   UROBILINOGEN 0.2 06/13/2018 0916   NITRITE NEGATIVE 10/16/2021 1441    LEUKOCYTESUR NEGATIVE 10/16/2021 1441    FURTHER DISCHARGE INSTRUCTIONS:   Get Medicines reviewed and adjusted: Please take all your medications with you for your next visit with your Primary MD   Laboratory/radiological data: Please request your Primary MD to go over all hospital tests and  procedure/radiological results at the follow up, please ask your Primary MD to get all Hospital records sent to his/her office.   In some cases, they will be blood work, cultures and biopsy results pending at the time of your discharge. Please request that your primary care M.D. goes through all the records of your hospital data and follows up on these results.   Also Note the following: If you experience worsening of your admission symptoms, develop shortness of breath, life threatening emergency, suicidal or homicidal thoughts you must seek medical attention immediately by calling 911 or calling your MD immediately  if symptoms less severe.   You must read complete instructions/literature along with all the possible adverse reactions/side effects for all the Medicines you take and that have been prescribed to you. Take any new Medicines after you have completely understood and accpet all the possible adverse reactions/side effects.    Do not drive when taking Pain medications or sleeping medications (Benzodaizepines)   Do not take more than prescribed Pain, Sleep and Anxiety Medications. It is not advisable to combine anxiety,sleep and pain medications without talking with your primary care practitioner   Special Instructions: If you have smoked or chewed Tobacco  in the last 2 yrs please stop smoking, stop any regular Alcohol  and or any Recreational drug use.   Wear Seat belts while driving.   Please note: You were cared for by a hospitalist during your hospital stay. Once you are discharged, your primary care physician will handle any further medical issues. Please note that NO REFILLS for any  discharge medications will be authorized once you are discharged, as it is imperative that you return to your primary care physician (or establish a relationship with a primary care physician if you do not have one) for your post hospital discharge needs so that they can reassess your need for medications and monitor your lab values.  Time coordinating discharge: 40 minutes  SIGNED:  Marzetta Board, MD, PhD 03/26/2022, 1:11 PM

## 2022-03-29 ENCOUNTER — Other Ambulatory Visit: Payer: Self-pay

## 2022-03-29 DIAGNOSIS — G4730 Sleep apnea, unspecified: Secondary | ICD-10-CM

## 2022-05-06 ENCOUNTER — Emergency Department (HOSPITAL_COMMUNITY)
Admission: EM | Admit: 2022-05-06 | Discharge: 2022-05-06 | Disposition: A | Discharge status: Home / Self Care | Payer: Medicaid Other | Source: Home / Self Care | Attending: Emergency Medicine | Admitting: Emergency Medicine

## 2022-05-06 ENCOUNTER — Other Ambulatory Visit: Payer: Self-pay

## 2022-05-06 ENCOUNTER — Encounter (HOSPITAL_COMMUNITY): Payer: Self-pay | Admitting: Emergency Medicine

## 2022-05-06 ENCOUNTER — Emergency Department (HOSPITAL_COMMUNITY): Payer: Medicaid Other

## 2022-05-06 DIAGNOSIS — M359 Systemic involvement of connective tissue, unspecified: Secondary | ICD-10-CM | POA: Diagnosis present

## 2022-05-06 DIAGNOSIS — J849 Interstitial pulmonary disease, unspecified: Secondary | ICD-10-CM | POA: Insufficient documentation

## 2022-05-06 DIAGNOSIS — I272 Pulmonary hypertension, unspecified: Secondary | ICD-10-CM | POA: Insufficient documentation

## 2022-05-06 DIAGNOSIS — J9621 Acute and chronic respiratory failure with hypoxia: Secondary | ICD-10-CM | POA: Insufficient documentation

## 2022-05-06 DIAGNOSIS — Z1152 Encounter for screening for COVID-19: Secondary | ICD-10-CM | POA: Insufficient documentation

## 2022-05-06 DIAGNOSIS — M3213 Lung involvement in systemic lupus erythematosus: Secondary | ICD-10-CM | POA: Insufficient documentation

## 2022-05-06 DIAGNOSIS — Z79899 Other long term (current) drug therapy: Secondary | ICD-10-CM | POA: Insufficient documentation

## 2022-05-06 LAB — RESPIRATORY PANEL BY PCR

## 2022-05-06 LAB — BASIC METABOLIC PANEL
Anion gap: 9 (ref 5–15)
BUN: 7 mg/dL (ref 6–20)
CO2: 32 mmol/L (ref 22–32)
Calcium: 8.8 mg/dL — ABNORMAL LOW (ref 8.9–10.3)
Chloride: 97 mmol/L — ABNORMAL LOW (ref 98–111)
Creatinine, Ser: 0.69 mg/dL (ref 0.44–1.00)
GFR, Estimated: >60 mL/min (ref >60)
Glucose, Bld: 116 mg/dL — ABNORMAL HIGH (ref 70–99)
Potassium: 3.4 mmol/L — ABNORMAL LOW (ref 3.5–5.1)
Sodium: 138 mmol/L (ref 135–145)

## 2022-05-06 LAB — LACTIC ACID, PLASMA: Lactic Acid, Venous: 0.9 mmol/L (ref 0.5–1.9)

## 2022-05-06 LAB — BLOOD GAS, VENOUS
Acid-Base Excess: 6.5 mmol/L — ABNORMAL HIGH (ref 0.0–2.0)
Bicarbonate: 36.4 mmol/L — ABNORMAL HIGH (ref 20.0–28.0)
O2 Saturation: 59.4 %
Patient temperature: 37
pCO2, Ven: 83 mmHg (ref 44–60)
pH, Ven: 7.25 (ref 7.25–7.43)
pO2, Ven: 41 mmHg (ref 32–45)

## 2022-05-06 LAB — CBC
HCT: 40.5 % (ref 36.0–46.0)
Hemoglobin: 11.5 g/dL — ABNORMAL LOW (ref 12.0–15.0)
MCH: 24.8 pg — ABNORMAL LOW (ref 26.0–34.0)
MCHC: 28.4 g/dL — ABNORMAL LOW (ref 30.0–36.0)
MCV: 87.5 fL (ref 80.0–100.0)
Platelets: 368 10*3/uL (ref 150–400)
RBC: 4.63 MIL/uL (ref 3.87–5.11)
RDW: 16.8 % — ABNORMAL HIGH (ref 11.5–15.5)
WBC: 12.5 10*3/uL — ABNORMAL HIGH (ref 4.0–10.5)
nRBC: 0 % (ref 0.0–0.2)

## 2022-05-06 LAB — RESP PANEL BY RT-PCR (FLU A&B, COVID) ARPGX2
Influenza A by PCR: NEGATIVE
Influenza B by PCR: NEGATIVE
SARS Coronavirus 2 by RT PCR: NEGATIVE

## 2022-05-06 LAB — BRAIN NATRIURETIC PEPTIDE: B Natriuretic Peptide: 45.5 pg/mL (ref 0.0–100.0)

## 2022-05-06 MED ORDER — ONDANSETRON HCL 4 MG/2ML IJ SOLN: 4.0000 mg | Freq: Four times a day (QID) | INTRAMUSCULAR | Status: DC | PRN

## 2022-05-06 MED ORDER — MYCOPHENOLATE SODIUM 180 MG PO TBEC: 720.0000 mg | DELAYED_RELEASE_TABLET | Freq: Two times a day (BID) | ORAL | Status: DC

## 2022-05-06 MED ORDER — ACETAMINOPHEN 325 MG PO TABS: 650.0000 mg | ORAL_TABLET | Freq: Four times a day (QID) | ORAL | Status: DC | PRN

## 2022-05-06 MED ORDER — ALBUTEROL SULFATE (2.5 MG/3ML) 0.083% IN NEBU: 2.5000 mg | INHALATION_SOLUTION | RESPIRATORY_TRACT | Status: DC | PRN

## 2022-05-06 MED ORDER — ATOVAQUONE 750 MG/5ML PO SUSP: 1500.0000 mg | Freq: Every morning | ORAL | Status: DC

## 2022-05-06 MED ORDER — ACETAMINOPHEN 650 MG RE SUPP: 650.0000 mg | Freq: Four times a day (QID) | RECTAL | Status: DC | PRN

## 2022-05-06 MED ORDER — HYDROXYCHLOROQUINE SULFATE 200 MG PO TABS: 200.0000 mg | ORAL_TABLET | Freq: Two times a day (BID) | ORAL | Status: DC

## 2022-05-06 MED ORDER — HYDROCOD POLI-CHLORPHE POLI ER 10-8 MG/5ML PO SUER: 5.0000 mL | Freq: Two times a day (BID) | ORAL | Status: DC

## 2022-05-06 MED ORDER — LORAZEPAM 2 MG/ML IJ SOLN: 1.0000 mg | INTRAMUSCULAR | Status: DC | PRN

## 2022-05-06 MED ORDER — ONDANSETRON HCL 4 MG PO TABS: 4.0000 mg | ORAL_TABLET | Freq: Four times a day (QID) | ORAL | Status: DC | PRN

## 2022-05-06 MED ORDER — POTASSIUM CHLORIDE CRYS ER 20 MEQ PO TBCR: 40.0000 meq | EXTENDED_RELEASE_TABLET | ORAL | Status: DC

## 2022-05-06 MED ORDER — ENOXAPARIN SODIUM 40 MG/0.4ML IJ SOSY: 40.0000 mg | PREFILLED_SYRINGE | INTRAMUSCULAR | Status: DC

## 2022-05-06 MED ORDER — METHYLPREDNISOLONE SODIUM SUCC 40 MG IJ SOLR: 40.0000 mg | Freq: Two times a day (BID) | INTRAMUSCULAR | Status: DC

## 2022-05-06 MED ORDER — ASPIRIN 81 MG PO TBEC: 81.0000 mg | DELAYED_RELEASE_TABLET | Freq: Every day | ORAL | Status: DC

## 2022-05-06 MED ORDER — ACETAZOLAMIDE 250 MG PO TABS: 250.0000 mg | ORAL_TABLET | Freq: Two times a day (BID) | ORAL | Status: DC

## 2022-05-06 MED ORDER — ACETAMINOPHEN 325 MG PO TABS
650.0000 mg | ORAL_TABLET | Freq: Once | ORAL | Status: AC
  Administered 2022-05-06: 650 mg via ORAL
  Filled 2022-05-06: qty 2

## 2022-05-06 MED ORDER — DEXAMETHASONE 4 MG PO TABS
16.0000 mg | ORAL_TABLET | Freq: Once | ORAL | Status: AC
  Administered 2022-05-06: 16 mg via ORAL
  Filled 2022-05-06: qty 4

## 2022-05-06 MED ORDER — TREPROSTINIL 0.6 MG/ML IN SOLN: 18.0000 ug | Freq: Four times a day (QID) | RESPIRATORY_TRACT | Status: DC

## 2022-05-06 MED ORDER — LEVOFLOXACIN IN D5W 750 MG/150ML IV SOLN
750.0000 mg | Freq: Once | INTRAVENOUS | Status: AC
  Administered 2022-05-06: 750 mg via INTRAVENOUS
  Filled 2022-05-06: qty 150

## 2022-05-06 MED ORDER — IBUPROFEN 200 MG PO TABS: 400.0000 mg | ORAL_TABLET | ORAL | Status: DC | PRN

## 2022-05-06 MED ORDER — METHYLPREDNISOLONE SODIUM SUCC 125 MG IJ SOLR: 125.0000 mg | Freq: Once | INTRAMUSCULAR | Status: DC

## 2022-05-06 MED ORDER — POTASSIUM CHLORIDE CRYS ER 20 MEQ PO TBCR
20.0000 meq | EXTENDED_RELEASE_TABLET | Freq: Three times a day (TID) | ORAL | Status: DC
  Administered 2022-05-06: 20 meq via ORAL
  Filled 2022-05-06: qty 1

## 2022-05-06 MED ORDER — TORSEMIDE 20 MG PO TABS: 20.0000 mg | ORAL_TABLET | Freq: Two times a day (BID) | ORAL | Status: DC

## 2022-05-06 NOTE — ED Notes (Signed)
This writer was prepping to take patient to ICU/Stepdown when patient states that she does not wish to stay. Patient educated multiple times about the importance of her staying and getting treated for her respiratory issues. Patient stated that she would see her Pulmonologist at [facility] this week. Hospitalist made aware. Pt called for her ride to come pick her up.

## 2022-05-06 NOTE — ED Triage Notes (Signed)
BIBA Per EMS: Pt coming from home w/ c/o SHOB, weakness x 1 week.  Hx lupus On exertion sats drop to 80s.  160s HR  134/80 BP  90% 10L  Albuterol given en route  Wheezing noted

## 2022-05-06 NOTE — ED Provider Notes (Signed)
[facility] DEPT Provider Note   CSN: [id number] Arrival date & time: 05/06/22  9326     History  Chief Complaint  Patient presents with   Respiratory Distress    Rhonda Franco is a 21 y.o. female history of lupus with involvement of the lungs, pulmonary hypertension, and interstitial lung disease who presents to the emergency department complaining of shortness of breath.  Patient states that she has been feeling more short of breath for the past several days.  She was recently around her sister's children and they were sick with colds.  She thinks that she likely picked up what they had.  She normally wears 5 L oxygen at rest, and 10 L when she is moving around.  This was working well for her until the past 3 days or so.  She noted that with exertion her oxygen saturations were dropping into the 80s.  With EMS they noted wheezing and gave her albuterol.  She feels now her breathing has improved, but still has associated chest tightness and a productive cough.  HPI     Home Medications Prior to Admission medications   Medication Sig Start Date End Date Taking? Authorizing Provider  acetaZOLAMIDE (DIAMOX) 250 MG tablet Take 250 mg by mouth 2 (two) times daily.   Yes [provider]  aspirin EC 81 MG tablet Take 81 mg by mouth daily.   Yes [provider]  atovaquone (MEPRON) 750 MG/5ML suspension Take 10 mLs by mouth in the morning. 05/02/21  Yes [provider]  ergocalciferol (VITAMIN D2) 1.25 MG (50000 UT) capsule Take 50,000 Units by mouth once a week.   Yes [provider]  famotidine (PEPCID) 20 MG tablet Take 20 mg by mouth 2 (two) times daily. 05/18/21  Yes [provider]  hydroxychloroquine (PLAQUENIL) 200 MG tablet Take 1 tablet (200 mg total) by mouth 2 (two) times daily. 01/18/18  Yes Charlann Lange, PA-C  mycophenolate (MYFORTIC) 360 MG TBEC EC tablet Take 720 mg by mouth every 12 (twelve) hours.  02/23/22 02/23/23 Yes [provider]  potassium chloride SA (KLOR-CON M) 20 MEQ tablet Take 20 mEq by mouth 3 (three) times daily. 03/01/22  Yes [provider]  predniSONE (DELTASONE) 20 MG tablet Take 2.5 tablets (50 mg total) by mouth daily with breakfast for 7 days, THEN 2 tablets (40 mg total) daily with breakfast for 7 days, THEN 1.5 tablets (30 mg total) daily with breakfast for 7 days, THEN 1 tablet (20 mg total) daily with breakfast for 7 days, THEN 0.5 tablets (10 mg total) daily with breakfast. 03/27/22 05/24/22 Yes Gherghe, Vella Redhead, MD  torsemide (DEMADEX) 20 MG tablet Take 20 mg by mouth 2 (two) times daily.   Yes [provider]  Treprostinil (TYVASO IN) Inhale 12 puffs into the lungs 4 (four) times daily.   Yes [provider]      Allergies    Ampicillin, Keflex [cephalexin], Penicillins, Bactrim [sulfamethoxazole-trimethoprim], and Levaquin [levofloxacin]    Review of Systems   Review of Systems  Constitutional:  Positive for chills.  HENT:  Positive for congestion.   Respiratory:  Positive for cough, chest tightness and shortness of breath.   Cardiovascular:  Negative for leg swelling.  All other systems reviewed and are negative.   Physical Exam Updated Vital Signs BP (!) 109/97   Pulse (!) 115   Temp 98.7 F (37.1 C) (Oral)   Resp (!) 23   SpO2 100%  Physical Exam  Vitals and nursing note reviewed.  Constitutional:      Appearance: Normal appearance.  HENT:     Head: Normocephalic and atraumatic.  Eyes:     Conjunctiva/sclera: Conjunctivae normal.  Cardiovascular:     Rate and Rhythm: Normal rate and regular rhythm.  Pulmonary:     Effort: Pulmonary effort is normal. No respiratory distress.     Breath sounds: No decreased air movement.     Comments: Mild end expiratory wheezing. Currently on non-rebreather at 10 L  Abdominal:     General: There is no distension.     Palpations: Abdomen is soft.     Tenderness: There is  no abdominal tenderness.  Skin:    General: Skin is warm and dry.  Neurological:     General: No focal deficit present.     Mental Status: She is alert.     ED Results / Procedures / Treatments   Labs (all labs ordered are listed, but only abnormal results are displayed) Labs Reviewed  CBC - Abnormal; Notable for the following components:      Result Value   WBC 12.5 (*)    Hemoglobin 11.5 (*)    MCH 24.8 (*)    MCHC 28.4 (*)    RDW 16.8 (*)    All other components within normal limits  BASIC METABOLIC PANEL - Abnormal; Notable for the following components:   Potassium 3.4 (*)    Chloride 97 (*)    Glucose, Bld 116 (*)    Calcium 8.8 (*)    All other components within normal limits  BLOOD GAS, VENOUS - Abnormal; Notable for the following components:   pCO2, Ven 83 (*)    Bicarbonate 36.4 (*)    Acid-Base Excess 6.5 (*)    All other components within normal limits  RESP PANEL BY RT-PCR (FLU A&B, COVID) ARPGX2  LACTIC ACID, PLASMA  BRAIN NATRIURETIC PEPTIDE  LACTIC ACID, PLASMA    EKG None  Radiology DG Chest 2 View  Result Date: 05/06/2022 CLINICAL DATA:  Productive cough. Short of breath and weakness. History of lupus and hypertension. EXAM: CHEST - 2 VIEW COMPARISON:  03/18/2022.  CT, 02/18/2022. FINDINGS: Cardiac silhouette is normal in size and configuration. No mediastinal or hilar masses.  No evidence of adenopathy. Bilateral interstitial thickening. Hazy intervening ground-glass opacities are noted, most apparent in the left mid to upper lung. These findings are stable from the prior chest radiographs. No pleural effusion or pneumothorax. Skeletal structures are intact. IMPRESSION: 1. No significant change from the prior chest radiographs. Diffuse bilateral interstitial thickening with left greater than right ground-glass opacities. Given the stability, this is all suspected to be due to interstitial lung disease. Acute superimposed infection is not excluded.  Electronically Signed   By: Lajean Manes M.D.   On: 05/06/2022 10:49    Procedures .Critical Care  Performed by: Kateri Plummer, PA-C Authorized by: Kateri Plummer, PA-C   Critical care provider statement:    Critical care time (minutes):  30   Critical care was necessary to treat or prevent imminent or life-threatening deterioration of the following conditions:  Respiratory failure   Critical care was time spent personally by me on the following activities:  Development of treatment plan with patient or surrogate, discussions with consultants, evaluation of patient's response to treatment, examination of patient, ordering and review of laboratory studies, ordering and review of radiographic studies, ordering and performing treatments and interventions, pulse oximetry, re-evaluation of patient's condition and review  of [place] discussed with: admitting provider     Medications Ordered in ED Medications  levofloxacin (LEVAQUIN) IVPB 750 mg (750 mg Intravenous New Bag/Given 05/06/22 1436)  acetaminophen (TYLENOL) tablet 650 mg (650 mg Oral Given 05/06/22 1320)  dexamethasone (DECADRON) tablet 16 mg (16 mg Oral Given 05/06/22 1320)    ED Course/ Medical Decision Making/ A&P                           Medical Decision Making Amount and/or Complexity of Data Reviewed Labs: ordered. Radiology: ordered.  This patient is a 21 y.o. female who presents to the ED for concern of shortness of breath, this involves an extensive number of treatment options, and is a complaint that carries with it a high risk of complications and morbidity. The emergent differential diagnosis prior to evaluation includes, but is not limited to,  CHF, pericardial effusion/tamponade, arrhythmias, ACS, COPD, asthma, bronchitis, pneumonia, pneumothorax, PE, anemia, neuromuscular disorder. This is not an exhaustive differential.   Past Medical History / Co-morbidities / Social History: Lupus with  involvement of the lungs, pulmonary hypertension, and interstitial lung disease  Additional history: Chart reviewed. Pertinent results include: Most recently admitted to this hospital on 9/6 with a similar presentation.  At that time she was requiring 15 L nasal cannula while at rest.  She was admitted to stepdown unit for pulmonary edema and possible pneumonia, required BiPAP.  Physical Exam: Physical exam performed. The pertinent findings include: Persistently tachycardic in the 120s.  Tachypneic.  Increased respiratory effort with mild end expiratory wheezing in all lung fields.  Maintaining normal oxygen saturations on 10 L nonrebreather.  Afebrile.  Lab Tests: I ordered, and personally interpreted labs.  The pertinent results include: Leukocytosis of 12.5.  BMP at baseline.  Blood gas with PCO2 83, pH 7.25.  Normal lactic.  BNP pending at time of admission.  Respiratory panel negative for COVID and flu.   Imaging Studies: I ordered imaging studies including x-ray. I independently visualized and interpreted imaging which showed diffuse bilateral interstitial thickening, greater on the left compared to right.  Cannot rule out acute superimposed infection. I agree with the radiologist interpretation.   Cardiac Monitoring:  The patient was maintained on a cardiac monitor.  My attending physician Dr. Tomi Bamberger viewed and interpreted the cardiac monitored which showed an underlying rhythm of: Sinus tachycardia with a rate of 143 bpm. I agree with this interpretation.   Medications: I ordered medication including Tylenol for headache, Decadron, Levaquin for pneumonia.  I also ordered Ativan as needed prior to BiPAP.  I have reviewed the patients home medicines and have made adjustments as needed.  Consultations Obtained: I requested consultation with the hospitalist Dr Wynelle Cleveland,  and discussed lab and imaging findings as well as pertinent plan - they recommend: admission to step down unit as patient is  requiring BIPAP   Disposition: After consideration of the diagnostic results and the patients response to treatment, I feel that patient would benefit from medical admission for acute on chronic respiratory failure with hypoxia.   I discussed this case with my attending physician Dr. Tomi Bamberger who cosigned this note including patient's presenting symptoms, physical exam, and planned diagnostics and interventions. Attending physician stated agreement with plan or made changes to plan which were implemented.  Final Clinical Impression(s) / ED Diagnoses Final diagnoses:  Acute on chronic respiratory failure with hypoxia and hypercapnia (HCC)  Interstitial lung disease ([place])  Systemic lupus erythematosus with lung involvement, unspecified SLE type ([place])    Rx / DC Orders ED Discharge Orders     None      Portions of this report may have been transcribed using voice recognition software. Every effort was made to ensure accuracy; however, inadvertent computerized transcription errors may be present.    Estill Cotta 05/06/22 1521    Dorie Rank, MD 05/07/22 [phone]

## 2022-05-06 NOTE — Progress Notes (Signed)
Pt is awake and alert. Her breathing is not distressed at this time. Spoke with md. Made bipap prn. Will place on bipap if needed.

## 2022-05-06 NOTE — ED Notes (Signed)
Patient ambulated to restroom with minimal assistance. Patient again educated on the reasons she needs to stay at the hospital.

## 2022-05-06 NOTE — H&P (Addendum)
History and Physical    Rhonda Franco  VEH:[id number]  DOB: 2000-12-30  DOA: 05/06/2022 PCP: Myrtie Neither, PA-C   Patient coming from: home  Chief Complaint: shortness of breath and increased cough  HPI: Rhonda Franco is a 21 y.o. female with medical history of SLE with resultant interstitial lung disease who is chronically on oxygen at home, pulmonary hypertension & obesity who presents to the hospital for increasing productive cough for the past 4 to 5 days with shortness of breath.  She initially had some diarrhea when this occurred but that has resolved.  She has not had any nausea or vomiting.  She has felt hot and had sweats but is not aware of any temperatures.  She also complains of a headache.  No complaints of neck stiffness.  She typically wears 5 L of O 2 at rest and 10 L when she ambulates. She was placed on a Prednisone taper last month when she was admitted to the hospital for similar but more severe issues and reduced the dose to 10 mg about 5 days ago.   ED Course:  CXR > diffuse b/l intersitital thickening with L greater than R ground glass opacities- no significant change from last CXR Given Solumderol  Review of Systems:  All other systems reviewed and apart from HPI, are negative.  Past Medical History:  Diagnosis Date   Anemia    Class 2 obesity 03/14/2022   Lupus ([place])    Prediabetes    Pulmonary hypertension ([place]) 10/21/2021   Formatting of this note might be different from the original. 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  [place] Data: - PFT 10/19/21: FVC 31% pred, FEV1 33% pred, FEV1/FVC 94%, DLCO 34% pred. Consistent with severe restriction, no obstruction and severe decrease in DLCO. Normal DLCO/FVC ratio. - TTE 11/23/20: Normal LV size with mildly reduced function (LVEF 50%). Normal   Sleep apnea 08/25/2008   Formatting of this note might be different from the original. ICD10 Conversion    History reviewed. No  pertinent surgical history.  Social History:   reports that she has never smoked. She has never used smokeless tobacco. She reports that she does not drink alcohol and does not use drugs.  Allergies  Allergen Reactions   Ampicillin Anaphylaxis, Swelling, Palpitations and Rash   Keflex [Cephalexin] Anaphylaxis, Swelling and Other (See Comments)    Throat swelling   Penicillins Anaphylaxis, Swelling, Palpitations and Rash    Has patient had a PCN reaction causing immediate rash, facial/tongue/throat swelling, SOB or lightheadedness with hypotension: Yes Has patient had a PCN reaction causing severe rash involving mucus membranes or skin necrosis: Yes Has patient had a PCN reaction that required hospitalization: Yes Has patient had a PCN reaction occurring within the last 10 years: Yes If all of the above answers are "NO", then may proceed with Cephalosporin use.    Bactrim [Sulfamethoxazole-Trimethoprim] Nausea Only and Rash   Levaquin [Levofloxacin] Itching    Vaginal itching    Family History  Problem Relation Age of Onset   Diabetes Mother    Arthritis Mother    Diabetes Maternal Aunt    Diabetes Maternal Grandfather    Heart disease Maternal Grandfather    Stroke Paternal Grandmother      Prior to Admission medications   Medication Sig Start Date End Date Taking? Authorizing Provider  acetaZOLAMIDE (DIAMOX) 250 MG tablet Take 250 mg by mouth 2 (two) times daily.   Yes [provider]  aspirin EC 81 MG tablet Take 81 mg by mouth daily.   Yes [provider]  atovaquone (MEPRON) 750 MG/5ML suspension Take 10 mLs by mouth in the morning. 05/02/21  Yes [provider]  ergocalciferol (VITAMIN D2) 1.25 MG (50000 UT) capsule Take 50,000 Units by mouth once a week.   Yes [provider]  famotidine (PEPCID) 20 MG tablet Take 20 mg by mouth 2 (two) times daily. 05/18/21  Yes [provider]  hydroxychloroquine (PLAQUENIL) 200 MG tablet  Take 1 tablet (200 mg total) by mouth 2 (two) times daily. 01/18/18  Yes Charlann Lange, PA-C  mycophenolate (MYFORTIC) 360 MG TBEC EC tablet Take 720 mg by mouth every 12 (twelve) hours. 02/23/22 02/23/23 Yes [provider]  potassium chloride SA (KLOR-CON M) 20 MEQ tablet Take 20 mEq by mouth 3 (three) times daily. 03/01/22  Yes [provider]  predniSONE (DELTASONE) 20 MG tablet Take 2.5 tablets (50 mg total) by mouth daily with breakfast for 7 days, THEN 2 tablets (40 mg total) daily with breakfast for 7 days, THEN 1.5 tablets (30 mg total) daily with breakfast for 7 days, THEN 1 tablet (20 mg total) daily with breakfast for 7 days, THEN 0.5 tablets (10 mg total) daily with breakfast. 03/27/22 05/24/22 Yes Gherghe, Vella Redhead, MD  torsemide (DEMADEX) 20 MG tablet Take 20 mg by mouth 2 (two) times daily.   Yes [provider]  Treprostinil (TYVASO IN) Inhale 12 puffs into the lungs 4 (four) times daily.   Yes [provider]    Physical Exam: Wt Readings from Last 3 Encounters:  03/20/22 94.9 kg  11/13/21 97.1 kg  10/16/21 107 kg   Vitals:   05/06/22 1345 05/06/22 1400 05/06/22 1403 05/06/22 1445  BP: 124/72 (!) 131/97  (!) 109/97  Pulse: (!) 108 (!) 108  (!) 115  Resp: (!) 34 (!) 24  (!) 23  Temp:   98.7 F (37.1 C)   TempSrc:   Oral   SpO2: 100% 97%  100%      Constitutional:  Calm & comfortable Eyes: PERRLA, lids and conjunctivae normal ENT:  Mucous membranes are moist.  Pharynx clear of exudate   Normal dentition.  Respiratory:  B/l crackles, tachypnea but able to speak in full sentences and is not in distress. She has a cough.   Cardiovascular:  S1 & S2 heard, regular rate and rhythm No Murmurs Abdomen:  Non distended No tenderness, No masses Bowel sounds normal Extremities:  No clubbing / cyanosis No pedal edema  Skin:  No rashes, lesions or ulcers Neurologic:  AAO x 3 CN 2-12 grossly intact Sensation intact Strength 5/5 in  all 4 extremities Psychiatric:  Normal Mood and affect    Labs on Admission: I have personally reviewed following labs and imaging studies  CBC: Recent Labs  Lab 05/06/22 1340  WBC 12.5*  HGB 11.5*  HCT 40.5  MCV 87.5  PLT 696   Basic Metabolic Panel: Recent Labs  Lab 05/06/22 1340  NA 138  K 3.4*  CL 97*  CO2 32  GLUCOSE 116*  BUN 7  CREATININE 0.69  CALCIUM 8.8*   GFR: CrCl cannot be calculated (Unknown ideal weight.). Liver Function Tests: No results for input(s): "AST", "ALT", "ALKPHOS", "BILITOT", "PROT", "ALBUMIN" in the last 168 hours. No results for input(s): "LIPASE", "AMYLASE" in the last 168 hours. No results for input(s): "AMMONIA" in the last 168 hours. Coagulation Profile: No results for input(s): "INR", "PROTIME" in the  last 168 hours. Cardiac Enzymes: No results for input(s): "CKTOTAL", "CKMB", "CKMBINDEX", "TROPONINI" in the last 168 hours. BNP (last 3 results) No results for input(s): "PROBNP" in the last 8760 hours. HbA1C: No results for input(s): "HGBA1C" in the last 72 hours. CBG: No results for input(s): "GLUCAP" in the last 168 hours. Lipid Profile: No results for input(s): "CHOL", "HDL", "LDLCALC", "TRIG", "CHOLHDL", "LDLDIRECT" in the last 72 hours. Thyroid Function Tests: No results for input(s): "TSH", "T4TOTAL", "FREET4", "T3FREE", "THYROIDAB" in the last 72 hours. Anemia Panel: No results for input(s): "VITAMINB12", "FOLATE", "FERRITIN", "TIBC", "IRON", "RETICCTPCT" in the last 72 hours. Urine analysis:    Component Value Date/Time   COLORURINE YELLOW 10/16/2021 1441   APPEARANCEUR CLEAR 10/16/2021 1441   LABSPEC >=1.030 10/16/2021 1441   PHURINE 6.0 10/16/2021 1441   GLUCOSEU NEGATIVE 10/16/2021 1441   HGBUR NEGATIVE 10/16/2021 1441   BILIRUBINUR NEGATIVE 10/16/2021 1441   BILIRUBINUR small (A) 06/13/2018 0916   KETONESUR NEGATIVE 10/16/2021 1441   PROTEINUR 100 (A) 10/16/2021 1441   UROBILINOGEN 0.2 06/13/2018 0916    NITRITE NEGATIVE 10/16/2021 1441   LEUKOCYTESUR NEGATIVE 10/16/2021 1441   Sepsis Labs: _0 (procalcitonin:4,lacticidven:4) ) Recent Results (from the past 240 hour(s))  Resp Panel by RT-PCR (Flu A&B, Covid) Anterior Nasal Swab     Status: None   Collection Time: 05/06/22 10:10 AM   Specimen: Anterior Nasal Swab  Result Value Ref Range Status   SARS Coronavirus 2 by RT PCR NEGATIVE NEGATIVE Final    Comment: (NOTE) SARS-CoV-2 target nucleic acids are NOT DETECTED.  The SARS-CoV-2 RNA is generally detectable in upper respiratory specimens during the acute phase of infection. The lowest concentration of SARS-CoV-2 viral copies this assay can detect is 138 copies/mL. A negative result does not preclude SARS-Cov-2 infection and should not be used as the sole basis for treatment or other patient management decisions. A negative result may occur with  improper specimen collection/handling, submission of specimen other than nasopharyngeal swab, presence of viral mutation(s) within the areas targeted by this assay, and inadequate number of viral copies(<138 copies/mL). A negative result must be combined with clinical observations, patient history, and epidemiological information. The expected result is Negative.  Fact Sheet for Patients:  EntrepreneurPulse.com.au  Fact Sheet for Healthcare Providers:  IncredibleEmployment.be  This test is no t yet approved or cleared by the [place] FDA and  has been authorized for detection and/or diagnosis of SARS-CoV-2 by FDA under an Emergency Use Authorization (EUA). This EUA will remain  in effect (meaning this test can be used) for the duration of the COVID-19 declaration under Section 564(b)(1) of the Act, 21 U.S.C.section 360bbb-3(b)(1), unless the authorization is terminated  or revoked sooner.       Influenza A by PCR NEGATIVE NEGATIVE Final   Influenza B by PCR NEGATIVE NEGATIVE Final     Comment: (NOTE) The Xpert Xpress SARS-CoV-2/FLU/RSV plus assay is intended as an aid in the diagnosis of influenza from Nasopharyngeal swab specimens and should not be used as a sole basis for treatment. Nasal washings and aspirates are unacceptable for Xpert Xpress SARS-CoV-2/FLU/RSV testing.  Fact Sheet for Patients: EntrepreneurPulse.com.au  Fact Sheet for Healthcare Providers: IncredibleEmployment.be  This test is not yet approved or cleared by the [place] FDA and has been authorized for detection and/or diagnosis of SARS-CoV-2 by FDA under an Emergency Use Authorization (EUA). This EUA will remain in effect (meaning this test can be used) for the duration of the COVID-19 declaration under Section 564(b)(1) of  the Act, 21 U.S.C. section 360bbb-3(b)(1), unless the authorization is terminated or revoked.  Performed at [facility], [place] [address], [place], [place] [address]      Radiological Exams on Admission: DG Chest 2 View  Result Date: 05/06/2022 CLINICAL DATA:  Productive cough. Short of breath and weakness. History of lupus and hypertension. EXAM: CHEST - 2 VIEW COMPARISON:  03/18/2022.  CT, 02/18/2022. FINDINGS: Cardiac silhouette is normal in size and configuration. No mediastinal or hilar masses.  No evidence of adenopathy. Bilateral interstitial thickening. Hazy intervening ground-glass opacities are noted, most apparent in the left mid to upper lung. These findings are stable from the prior chest radiographs. No pleural effusion or pneumothorax. Skeletal structures are intact. IMPRESSION: 1. No significant change from the prior chest radiographs. Diffuse bilateral interstitial thickening with left greater than right ground-glass opacities. Given the stability, this is all suspected to be due to interstitial lung disease. Acute superimposed infection is not excluded. Electronically Signed   By: Lajean Manes M.D.    On: 05/06/2022 10:49    EKG: Independently reviewed. Sinus tachycardia  Assessment/Plan Principal Problem:   Acute on chronic respiratory failure with hypercapnia   Systemic lupus erythematosus with lung involvement ([place]) - last admitted 03/14/22-03/26/22- she left AMA last time - VBG shows a PCO2 of 83 and pH of 7.25-  - noted to be on a non-rebreather when I entered the room- pulse ox was 100%- -  we have weaned her to her home O2 which is 5 L and pulse ox is about 87-88% - as she is a CO2 retainer, I would not like her FiO2 to be too high which I have explained to her - will obtain a resp panel - cont Solumedrol and Levaquin& add Tusionex - cont immunosuppressants - of note, BiPAP was used during the prior admission- I have ordered it PRN for now  Active Problems: Pulm HTN with chronic RV failure - cont Demadex and potassium supplement  Morbid obesity - she states that once she is able to reach 150 lb, she will be able to qualify for a lung transplant  DVT prophylaxis: Lovenox  Code Status: full code  Consults called: none  Admission status:  Level of care: Leanora Ivanoff MD [facility]    05/06/2022, 4:02 PM

## 2022-05-06 NOTE — ED Notes (Signed)
Pt placed on [place] Cannula at 8 LPM due to O2 sats in the mid 80's on 5lpm

## 2022-05-07 ENCOUNTER — Encounter (HOSPITAL_COMMUNITY): Payer: Self-pay

## 2022-05-07 ENCOUNTER — Inpatient Hospital Stay (HOSPITAL_COMMUNITY)
Admission: EM | Admit: 2022-05-07 | Discharge: 2022-05-11 | DRG: 193 | Disposition: A | Discharge status: Home / Self Care | Payer: Medicaid Other | Attending: Internal Medicine | Admitting: Internal Medicine

## 2022-05-07 DIAGNOSIS — M329 Systemic lupus erythematosus, unspecified: Secondary | ICD-10-CM | POA: Diagnosis present

## 2022-05-07 DIAGNOSIS — M3213 Lung involvement in systemic lupus erythematosus: Secondary | ICD-10-CM | POA: Diagnosis present

## 2022-05-07 DIAGNOSIS — E876 Hypokalemia: Secondary | ICD-10-CM | POA: Diagnosis present

## 2022-05-07 DIAGNOSIS — Z7982 Long term (current) use of aspirin: Secondary | ICD-10-CM

## 2022-05-07 DIAGNOSIS — Z6835 Body mass index (BMI) 35.0-35.9, adult: Secondary | ICD-10-CM | POA: Diagnosis not present

## 2022-05-07 DIAGNOSIS — Z8249 Family history of ischemic heart disease and other diseases of the circulatory system: Secondary | ICD-10-CM

## 2022-05-07 DIAGNOSIS — J8410 Pulmonary fibrosis, unspecified: Secondary | ICD-10-CM | POA: Diagnosis present

## 2022-05-07 DIAGNOSIS — D638 Anemia in other chronic diseases classified elsewhere: Secondary | ICD-10-CM | POA: Diagnosis present

## 2022-05-07 DIAGNOSIS — Z79899 Other long term (current) drug therapy: Secondary | ICD-10-CM

## 2022-05-07 DIAGNOSIS — Z881 Allergy status to other antibiotic agents status: Secondary | ICD-10-CM

## 2022-05-07 DIAGNOSIS — J9621 Acute and chronic respiratory failure with hypoxia: Secondary | ICD-10-CM | POA: Diagnosis present

## 2022-05-07 DIAGNOSIS — Z880 Allergy status to penicillin: Secondary | ICD-10-CM | POA: Diagnosis not present

## 2022-05-07 DIAGNOSIS — K219 Gastro-esophageal reflux disease without esophagitis: Secondary | ICD-10-CM | POA: Diagnosis present

## 2022-05-07 DIAGNOSIS — Z9981 Dependence on supplemental oxygen: Secondary | ICD-10-CM

## 2022-05-07 DIAGNOSIS — Z20822 Contact with and (suspected) exposure to covid-19: Secondary | ICD-10-CM | POA: Diagnosis present

## 2022-05-07 DIAGNOSIS — J849 Interstitial pulmonary disease, unspecified: Secondary | ICD-10-CM

## 2022-05-07 DIAGNOSIS — Z833 Family history of diabetes mellitus: Secondary | ICD-10-CM | POA: Diagnosis not present

## 2022-05-07 DIAGNOSIS — J9622 Acute and chronic respiratory failure with hypercapnia: Secondary | ICD-10-CM | POA: Diagnosis present

## 2022-05-07 DIAGNOSIS — J121 Respiratory syncytial virus pneumonia: Principal | ICD-10-CM

## 2022-05-07 DIAGNOSIS — E669 Obesity, unspecified: Secondary | ICD-10-CM | POA: Diagnosis present

## 2022-05-07 DIAGNOSIS — Z823 Family history of stroke: Secondary | ICD-10-CM | POA: Diagnosis not present

## 2022-05-07 DIAGNOSIS — I2721 Secondary pulmonary arterial hypertension: Secondary | ICD-10-CM | POA: Diagnosis present

## 2022-05-07 DIAGNOSIS — Z7952 Long term (current) use of systemic steroids: Secondary | ICD-10-CM | POA: Diagnosis not present

## 2022-05-07 DIAGNOSIS — E8729 Other acidosis: Secondary | ICD-10-CM | POA: Diagnosis present

## 2022-05-07 DIAGNOSIS — I50812 Chronic right heart failure: Secondary | ICD-10-CM | POA: Diagnosis present

## 2022-05-07 DIAGNOSIS — Z91199 Patient's noncompliance with other medical treatment and regimen due to unspecified reason: Secondary | ICD-10-CM

## 2022-05-07 LAB — CBC WITH DIFFERENTIAL/PLATELET
Abs Immature Granulocytes: 0.33 10*3/uL — ABNORMAL HIGH (ref 0.00–0.07)
Basophils Absolute: 0.1 10*3/uL (ref 0.0–0.1)
Basophils Relative: 0 %
Eosinophils Absolute: 0 10*3/uL (ref 0.0–0.5)
Eosinophils Relative: 0 %
HCT: 37.2 % (ref 36.0–46.0)
Hemoglobin: 10.8 g/dL — ABNORMAL LOW (ref 12.0–15.0)
Immature Granulocytes: 3 %
Lymphocytes Relative: 2 %
Lymphs Abs: 0.2 10*3/uL — ABNORMAL LOW (ref 0.7–4.0)
MCH: 25.1 pg — ABNORMAL LOW (ref 26.0–34.0)
MCHC: 29 g/dL — ABNORMAL LOW (ref 30.0–36.0)
MCV: 86.5 fL (ref 80.0–100.0)
Monocytes Absolute: 0.5 10*3/uL (ref 0.1–1.0)
Monocytes Relative: 4 %
Neutro Abs: 10.5 10*3/uL — ABNORMAL HIGH (ref 1.7–7.7)
Neutrophils Relative %: 91 %
Platelets: 332 10*3/uL (ref 150–400)
RBC: 4.3 MIL/uL (ref 3.87–5.11)
RDW: 16.6 % — ABNORMAL HIGH (ref 11.5–15.5)
WBC: 11.7 10*3/uL — ABNORMAL HIGH (ref 4.0–10.5)
nRBC: 0 % (ref 0.0–0.2)

## 2022-05-07 LAB — BLOOD GAS, VENOUS
Acid-Base Excess: 5.6 mmol/L — ABNORMAL HIGH (ref 0.0–2.0)
Bicarbonate: 34.4 mmol/L — ABNORMAL HIGH (ref 20.0–28.0)
O2 Saturation: 98.8 %
Patient temperature: 37
pCO2, Ven: 70 mmHg — ABNORMAL HIGH (ref 44–60)
pH, Ven: 7.3 (ref 7.25–7.43)
pO2, Ven: 93 mmHg — ABNORMAL HIGH (ref 32–45)

## 2022-05-07 LAB — COMPREHENSIVE METABOLIC PANEL
ALT: 13 U/L (ref 0–44)
AST: 24 U/L (ref 15–41)
Albumin: 2.8 g/dL — ABNORMAL LOW (ref 3.5–5.0)
Alkaline Phosphatase: 54 U/L (ref 38–126)
Anion gap: 7 (ref 5–15)
BUN: 8 mg/dL (ref 6–20)
CO2: 30 mmol/L (ref 22–32)
Calcium: 9.1 mg/dL (ref 8.9–10.3)
Chloride: 97 mmol/L — ABNORMAL LOW (ref 98–111)
Creatinine, Ser: 0.42 mg/dL — ABNORMAL LOW (ref 0.44–1.00)
GFR, Estimated: >60 mL/min (ref >60)
Glucose, Bld: 118 mg/dL — ABNORMAL HIGH (ref 70–99)
Potassium: 4.4 mmol/L (ref 3.5–5.1)
Sodium: 134 mmol/L — ABNORMAL LOW (ref 135–145)
Total Bilirubin: 0.3 mg/dL (ref 0.3–1.2)
Total Protein: 6.7 g/dL (ref 6.5–8.1)

## 2022-05-07 LAB — MAGNESIUM: Magnesium: 1.9 mg/dL (ref 1.7–2.4)

## 2022-05-07 LAB — PHOSPHORUS: Phosphorus: 4.7 mg/dL — ABNORMAL HIGH (ref 2.5–4.6)

## 2022-05-07 LAB — MRSA NEXT GEN BY PCR, NASAL: MRSA by PCR Next Gen: NOT DETECTED

## 2022-05-07 MED ORDER — ACETAZOLAMIDE 250 MG PO TABS
250.0000 mg | ORAL_TABLET | Freq: Two times a day (BID) | ORAL | Status: DC
  Administered 2022-05-07 – 2022-05-11 (×9): 250 mg via ORAL
  Filled 2022-05-07 (×9): qty 1

## 2022-05-07 MED ORDER — ACETAMINOPHEN 325 MG PO TABS
650.0000 mg | ORAL_TABLET | Freq: Four times a day (QID) | ORAL | Status: DC | PRN
  Administered 2022-05-07 – 2022-05-08 (×3): 650 mg via ORAL
  Filled 2022-05-07 (×3): qty 2

## 2022-05-07 MED ORDER — ASPIRIN 81 MG PO TBEC
81.0000 mg | DELAYED_RELEASE_TABLET | Freq: Every day | ORAL | Status: DC
  Administered 2022-05-07 – 2022-05-11 (×5): 81 mg via ORAL
  Filled 2022-05-07 (×5): qty 1

## 2022-05-07 MED ORDER — TORSEMIDE 20 MG PO TABS
20.0000 mg | ORAL_TABLET | Freq: Two times a day (BID) | ORAL | Status: DC
  Administered 2022-05-07 – 2022-05-11 (×10): 20 mg via ORAL
  Filled 2022-05-07 (×12): qty 1

## 2022-05-07 MED ORDER — HYDROXYCHLOROQUINE SULFATE 200 MG PO TABS
200.0000 mg | ORAL_TABLET | Freq: Two times a day (BID) | ORAL | Status: DC
  Administered 2022-05-07 – 2022-05-11 (×9): 200 mg via ORAL
  Filled 2022-05-07 (×9): qty 1

## 2022-05-07 MED ORDER — MYCOPHENOLATE SODIUM 180 MG PO TBEC
720.0000 mg | DELAYED_RELEASE_TABLET | Freq: Two times a day (BID) | ORAL | Status: DC
  Administered 2022-05-07 – 2022-05-11 (×9): 720 mg via ORAL
  Filled 2022-05-07 (×10): qty 4

## 2022-05-07 MED ORDER — PROCHLORPERAZINE EDISYLATE 10 MG/2ML IJ SOLN
5.0000 mg | Freq: Four times a day (QID) | INTRAMUSCULAR | Status: DC | PRN
  Administered 2022-05-07: 5 mg via INTRAVENOUS
  Filled 2022-05-07: qty 2

## 2022-05-07 MED ORDER — POTASSIUM CHLORIDE CRYS ER 20 MEQ PO TBCR
20.0000 meq | EXTENDED_RELEASE_TABLET | Freq: Two times a day (BID) | ORAL | Status: DC
  Administered 2022-05-08 – 2022-05-11 (×7): 20 meq via ORAL
  Filled 2022-05-07 (×7): qty 1

## 2022-05-07 MED ORDER — LEVOFLOXACIN IN D5W 750 MG/150ML IV SOLN
750.0000 mg | INTRAVENOUS | Status: DC
  Administered 2022-05-07: 750 mg via INTRAVENOUS
  Filled 2022-05-07: qty 150

## 2022-05-07 MED ORDER — ENOXAPARIN SODIUM 40 MG/0.4ML IJ SOSY
40.0000 mg | PREFILLED_SYRINGE | INTRAMUSCULAR | Status: DC
  Administered 2022-05-07 – 2022-05-11 (×5): 40 mg via SUBCUTANEOUS
  Filled 2022-05-07 (×5): qty 0.4

## 2022-05-07 MED ORDER — HYDROMORPHONE HCL 1 MG/ML IJ SOLN: 0.5000 mg | INTRAMUSCULAR | Status: DC | PRN

## 2022-05-07 MED ORDER — METHYLPREDNISOLONE SODIUM SUCC 40 MG IJ SOLR
40.0000 mg | Freq: Two times a day (BID) | INTRAMUSCULAR | Status: DC
  Administered 2022-05-07 – 2022-05-09 (×6): 40 mg via INTRAVENOUS
  Filled 2022-05-07 (×6): qty 1

## 2022-05-07 MED ORDER — MELATONIN 5 MG PO TABS
5.0000 mg | ORAL_TABLET | Freq: Every evening | ORAL | Status: DC | PRN
  Administered 2022-05-07 – 2022-05-10 (×3): 5 mg via ORAL
  Filled 2022-05-07 (×3): qty 1

## 2022-05-07 MED ORDER — VITAMIN D (ERGOCALCIFEROL) 1.25 MG (50000 UNIT) PO CAPS
50000.0000 [IU] | ORAL_CAPSULE | ORAL | Status: DC
  Administered 2022-05-07: 50000 [IU] via ORAL
  Filled 2022-05-07: qty 1

## 2022-05-07 MED ORDER — ORAL CARE MOUTH RINSE: 15.0000 mL | OROMUCOSAL | Status: DC | PRN

## 2022-05-07 MED ORDER — POTASSIUM CHLORIDE CRYS ER 20 MEQ PO TBCR: 20.0000 meq | EXTENDED_RELEASE_TABLET | Freq: Three times a day (TID) | ORAL | Status: DC

## 2022-05-07 MED ORDER — OXYCODONE HCL 5 MG PO TABS: 5.0000 mg | ORAL_TABLET | Freq: Four times a day (QID) | ORAL | Status: DC | PRN

## 2022-05-07 MED ORDER — CHLORHEXIDINE GLUCONATE CLOTH 2 % EX PADS
6.0000 | MEDICATED_PAD | Freq: Every day | CUTANEOUS | Status: DC
  Administered 2022-05-07 – 2022-05-11 (×5): 6 via TOPICAL

## 2022-05-07 MED ORDER — POTASSIUM CHLORIDE CRYS ER 20 MEQ PO TBCR
20.0000 meq | EXTENDED_RELEASE_TABLET | Freq: Three times a day (TID) | ORAL | Status: DC
  Filled 2022-05-07: qty 1

## 2022-05-07 MED ORDER — ATOVAQUONE 750 MG/5ML PO SUSP
1500.0000 mg | Freq: Every morning | ORAL | Status: DC
  Administered 2022-05-07 – 2022-05-11 (×5): 1500 mg via ORAL
  Filled 2022-05-07 (×6): qty 10

## 2022-05-07 MED ORDER — TRAMADOL HCL 50 MG PO TABS
50.0000 mg | ORAL_TABLET | Freq: Four times a day (QID) | ORAL | Status: DC | PRN
  Administered 2022-05-07 – 2022-05-08 (×3): 50 mg via ORAL
  Filled 2022-05-07 (×4): qty 1

## 2022-05-07 MED ORDER — TREPROSTINIL 0.6 MG/ML IN SOLN
18.0000 ug | Freq: Four times a day (QID) | RESPIRATORY_TRACT | Status: DC
  Administered 2022-05-08 – 2022-05-11 (×15): 18 ug via RESPIRATORY_TRACT

## 2022-05-07 MED ORDER — IPRATROPIUM-ALBUTEROL 0.5-2.5 (3) MG/3ML IN SOLN
3.0000 mL | Freq: Four times a day (QID) | RESPIRATORY_TRACT | Status: DC
  Administered 2022-05-07 – 2022-05-08 (×7): 3 mL via RESPIRATORY_TRACT
  Filled 2022-05-07 (×7): qty 3

## 2022-05-07 MED ORDER — FAMOTIDINE 20 MG PO TABS
20.0000 mg | ORAL_TABLET | Freq: Two times a day (BID) | ORAL | Status: DC
  Administered 2022-05-07 – 2022-05-11 (×9): 20 mg via ORAL
  Filled 2022-05-07 (×9): qty 1

## 2022-05-07 MED ORDER — POTASSIUM CHLORIDE CRYS ER 20 MEQ PO TBCR
40.0000 meq | EXTENDED_RELEASE_TABLET | ORAL | Status: AC
  Administered 2022-05-07: 40 meq via ORAL
  Filled 2022-05-07: qty 2

## 2022-05-07 MED ORDER — OXYCODONE HCL 5 MG PO TABS
5.0000 mg | ORAL_TABLET | Freq: Four times a day (QID) | ORAL | Status: DC | PRN
  Administered 2022-05-07: 5 mg via ORAL
  Filled 2022-05-07: qty 1

## 2022-05-07 MED ORDER — POLYETHYLENE GLYCOL 3350 17 G PO PACK: 17.0000 g | PACK | Freq: Every day | ORAL | Status: DC | PRN

## 2022-05-07 NOTE — ED Triage Notes (Signed)
Pt. BIB [facility] for SOB. PT. Was supposed to be admitted earlier today, but left because she felt better. Pt. Was on High flow O2 PTA at 88% EMS placed pt. On NRB at 8L/min  EMS VS:  Bp: 145/90 Hr: 112 O2: 96%

## 2022-05-07 NOTE — ED Provider Notes (Signed)
[facility] DEPT Provider Note   CSN: [id number] Arrival date & time: 05/07/22  0000     History  Chief Complaint  Patient presents with   Shortness of Breath    Rhonda Franco is a 21 y.o. female.  The history is provided by the patient and medical records.  Shortness of Breath Rhonda Franco is a 21 y.o. female who presents to the Emergency Department complaining of SOB.  She presents to the ED for evaluation of progressive sob.  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  Over the last several days she has experienced increased shortness of breath and is at baseline on 5 L nasal cannula oxygen.  She reports runny nose, chest congestion, cough and diarrhea.  She was evaluated earlier today with plan to admit to the hospital but she left AMA with hopes that she could follow-up with her pulmonologist tomorrow.  She states that when she returned home she was experiencing recurrent hypoxia to the 80s and was too short of breath and symptomatic to stay at home.  No associated fevers, chest pain.       Home Medications Prior to Admission medications   Medication Sig Start Date End Date Taking? Authorizing Provider  acetaZOLAMIDE (DIAMOX) 250 MG tablet Take 250 mg by mouth 2 (two) times daily.    [provider]  aspirin EC 81 MG tablet Take 81 mg by mouth daily.    [provider]  atovaquone (MEPRON) 750 MG/5ML suspension Take 10 mLs by mouth in the morning. 05/02/21   [provider]  ergocalciferol (VITAMIN D2) 1.25 MG (50000 UT) capsule Take 50,000 Units by mouth once a week.    [provider]  famotidine (PEPCID) 20 MG tablet Take 20 mg by mouth 2 (two) times daily. 05/18/21   [provider]  hydroxychloroquine (PLAQUENIL) 200 MG tablet Take 1 tablet (200 mg total) by mouth 2 (two) times daily. 01/18/18   Charlann Lange, PA-C   mycophenolate (MYFORTIC) 360 MG TBEC EC tablet Take 720 mg by mouth every 12 (twelve) hours. 02/23/22 02/23/23  [provider]  potassium chloride SA (KLOR-CON M) 20 MEQ tablet Take 20 mEq by mouth 3 (three) times daily. 03/01/22   [provider]  predniSONE (DELTASONE) 20 MG tablet Take 2.5 tablets (50 mg total) by mouth daily with breakfast for 7 days, THEN 2 tablets (40 mg total) daily with breakfast for 7 days, THEN 1.5 tablets (30 mg total) daily with breakfast for 7 days, THEN 1 tablet (20 mg total) daily with breakfast for 7 days, THEN 0.5 tablets (10 mg total) daily with breakfast. 03/27/22 05/24/22  Caren Griffins, MD  torsemide (DEMADEX) 20 MG tablet Take 20 mg by mouth 2 (two) times daily.    [provider]  Treprostinil (TYVASO IN) Inhale 12 puffs into the lungs 4 (four) times daily.    [provider]      Allergies    Ampicillin, Keflex [cephalexin], Penicillins, Bactrim [sulfamethoxazole-trimethoprim], and Levaquin [levofloxacin]    Review of Systems   Review of Systems  Respiratory:  Positive for shortness of breath.   All other systems reviewed and are negative.   Physical Exam Updated Vital Signs BP 131/89   Pulse (!) 114   Temp 99.3 F (37.4 C) (Oral)   Resp (!) 32   SpO2 95%  Physical Exam Vitals and nursing note reviewed.  Constitutional:  Appearance: She is well-developed.  HENT:     Head: Normocephalic and atraumatic.  Cardiovascular:     Rate and Rhythm: Regular rhythm. Tachycardia present.     Heart sounds: No murmur heard. Pulmonary:     Effort: Pulmonary effort is normal. No respiratory distress.     Comments: Fine crackles in bilateral bases Abdominal:     Palpations: Abdomen is soft.     Tenderness: There is no abdominal tenderness. There is no guarding or rebound.  Musculoskeletal:        General: No swelling or tenderness.  Skin:    General: Skin is warm and dry.  Neurological:     Mental Status:  She is alert and oriented to person, place, and time.  Psychiatric:        Behavior: Behavior normal.     ED Results / Procedures / Treatments   Labs (all labs ordered are listed, but only abnormal results are displayed) Labs Reviewed  BLOOD GAS, VENOUS    EKG EKG Interpretation  Date/Time:  Monday May 07 2022 00:54:35 EDT Ventricular Rate:  113 PR Interval:  160 QRS Duration: 83 QT Interval:  322 QTC Calculation: 442 R Axis:   63 Text Interpretation: Sinus tachycardia Consider right atrial enlargement Confirmed by Quintella Reichert [phone]) on 05/07/2022 1:07:15 AM  Radiology DG Chest 2 View  Result Date: 05/06/2022 CLINICAL DATA:  Productive cough. Short of breath and weakness. History of lupus and hypertension. EXAM: CHEST - 2 VIEW COMPARISON:  03/18/2022.  CT, 02/18/2022. FINDINGS: Cardiac silhouette is normal in size and configuration. No mediastinal or hilar masses.  No evidence of adenopathy. Bilateral interstitial thickening. Hazy intervening ground-glass opacities are noted, most apparent in the left mid to upper lung. These findings are stable from the prior chest radiographs. No pleural effusion or pneumothorax. Skeletal structures are intact. IMPRESSION: 1. No significant change from the prior chest radiographs. Diffuse bilateral interstitial thickening with left greater than right ground-glass opacities. Given the stability, this is all suspected to be due to interstitial lung disease. Acute superimposed infection is not excluded. Electronically Signed   By: Lajean Manes M.D.   On: 05/06/2022 10:49    Procedures Procedures    Medications Ordered in ED Medications - No data to display  ED Course/ Medical Decision Making/ A&P                           Medical Decision Making Amount and/or Complexity of Data Reviewed Labs: ordered.  Risk Decision regarding hospitalization.   Patient with history of lupus, interstitial lung disease on chronic oxygen here  for evaluation of increased shortness of breath in setting of viral respiratory illness.  She is positive for RSV on testing from earlier today.  She does have significant oxygen requirement with fine crackles in bilateral bases.  She did receive steroids earlier today including Decadron.  Plan to admit for ongoing symptomatic and supportive care.  Medicine consulted for admission.        Final Clinical Impression(s) / ED Diagnoses Final diagnoses:  RSV (respiratory syncytial virus pneumonia)    Rx / DC Orders ED Discharge Orders     None         Quintella Reichert, MD 05/07/22 [phone]

## 2022-05-07 NOTE — Progress Notes (Signed)
Rhonda Franco  JYN:[id number] DOB: Oct 12, 2000 DOA: 05/07/2022 PCP: Myrtie Neither, PA-C    Brief Narrative:  21 year old with a history of SLE, advanced ILD, chronic hypoxic and hypercarbic respiratory failure requiring baseline of 5 L nasal cannula at rest and 10 L with ambulation, PAH, and obesity who returned to the ER after leaving the hospital AMA 6 hours prior with worsening shortness of breath.  During her prior hospital stay she was being treated for respiratory failure due to RSV.  At the time of her return she was noted to have a respiratory rate of 27, pulse rate of 108, and oxygen saturations 93% on 10 L high flow nasal cannula.  Consultants:  None  Goals of Care:  Code Status: Full Code   DVT prophylaxis: Lovenox  Interim Hx: The patient was interviewed and examined by one of my partners earlier today.  BiPAP was utilized overnight but this morning the patient has been transitioned to [place] high flow nasal cannula successfully.  Assessment & Plan:  Acute on chronic hypoxic and hypercarbic respiratory failure - acute ILD flare due to RSV infection 5 L nasal cannula at baseline and 10 L with exertion -currently requiring 10 L even at rest -resume Solu-Medrol and Levaquin dose during prior hospital stay -resume usual home immunosuppressants and inhaled medications  RSV URI POA Tested + 05/06/2022  SLP  - Advanced chronic ILD with PAH Followed at [facility] pulmonary clinic with next appointment 05/15/2022 - resume usual home medical regimen  Hypokalemia Corrected with supplementation  GERD Continue usual PPI  Obesity - Body mass index is 35.51 kg/m.  Medical noncompliance Was admitted to the hospital 10/29 and left AMA later that day -prior admission 9/6-9/18/2023 when she also left AMA  Disposition:    Objective: Blood pressure 113/77, pulse 93, temperature 98.4 F (36.9 C), temperature source Oral, resp. rate (!) 25, height _0  (1.651 m), weight 96.8 kg,  SpO2 96 %.  Intake/Output Summary (Last 24 hours) at 05/07/2022 0752 Last data filed at 05/07/2022 0600 Gross per 24 hour  Intake 150 ml  Output --  Net 150 ml   Filed Weights   05/07/22 0300  Weight: 96.8 kg    Examination: Patient was examined by one of my partners earlier today.  CBC: Recent Labs  Lab 05/06/22 1340 05/07/22 0515  WBC 12.5* 11.7*  NEUTROABS  --  10.5*  HGB 11.5* 10.8*  HCT 40.5 37.2  MCV 87.5 86.5  PLT 368 865   Basic Metabolic Panel: Recent Labs  Lab 05/06/22 1340 05/07/22 0515  NA 138 134*  K 3.4* 4.4  CL 97* 97*  CO2 32 30  GLUCOSE 116* 118*  BUN 7 8  CREATININE 0.69 0.42*  CALCIUM 8.8* 9.1  MG  --  1.9  PHOS  --  4.7*   GFR: Estimated Creatinine Clearance: 126.9 mL/min (A) (by C-G formula based on SCr of 0.42 mg/dL (L)).   Scheduled Meds:  acetaZOLAMIDE  250 mg Oral BID   aspirin EC  81 mg Oral Daily   atovaquone  1,500 mg Oral q AM   Chlorhexidine Gluconate Cloth  6 each Topical Daily   enoxaparin (LOVENOX) injection  40 mg Subcutaneous Q24H   famotidine  20 mg Oral BID   hydroxychloroquine  200 mg Oral BID   ipratropium-albuterol  3 mL Nebulization Q6H   methylPREDNISolone (SOLU-MEDROL) injection  40 mg Intravenous BID   mycophenolate  720 mg Oral Q12H   potassium chloride SA  20  mEq Oral TID   torsemide  20 mg Oral BID   Treprostinil  18 mcg Inhalation QID   Vitamin D (Ergocalciferol)  50,000 Units Oral Weekly   Continuous Infusions:  levofloxacin (LEVAQUIN) IV Stopped (05/07/22 0530)     LOS: 0 days   Cherene Altes, MD [facility] Office  [phone] Pager - Text Page per Shea Evans  If 7PM-7AM, please contact night-coverage per Amion 05/07/2022, 7:52 AM

## 2022-05-07 NOTE — Progress Notes (Signed)
Bipap on standby

## 2022-05-07 NOTE — H&P (Addendum)
History and Physical  Rhonda Franco RFF:[id number] DOB: 2000/09/25 DOA: 05/07/2022  Referring physician: Dr. Ralene Bathe, [place]  PCP: Myrtie Neither, PA-C  Outpatient Specialists: Pulmonary Patient coming from: Home.  Chief Complaint: Worsening shortness of breath.  HPI: Rhonda Franco is a 21 y.o. female with medical history significant for SLE, advanced interstitial lung disease, chronic hypoxic and hypercarbic respiratory failure, at baseline on 5 L nasal cannula at rest and 10 L with ambulation, pulmonary hypertension, obesity, who presented to the [facility] ED due to worsening shortness of breath after leaving the hospital [place] 6 hours prior.  States she left the hospital because she felt better.  Hours later she developed increased worsening dyspnea, worse with exertion.  Also reports pleuritic pain worse with coughing and left shoulder pain, denies trauma.  Prior to leaving [place] she was noted to have hypercarbia with PCO2 in the 80s and pH of 7.2, respiratory viral panel was positive for RSV.  In the ED, requiring 10 L to maintain O2 saturation greater than 90%.  EDP requested admission to resume management of her acute on chronic hypoxic and hypercarbic respiratory failure likely secondary to acute flare of interstitial lung disease.  ED Course: Tmax 99.3.  BP 125/83, pulse 108, respiratory rate 27, O2 saturation 93% on 10 L HFNC.  Review of Systems: Review of systems as noted in the HPI. All other systems reviewed and are negative.   Past Medical History:  Diagnosis Date   Anemia    Class 2 obesity 03/14/2022   Lupus ([place])    Prediabetes    Pulmonary hypertension ([place]) 10/21/2021   Formatting of this note might be different from the original. 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  [place] Data: - PFT 10/19/21: FVC 31% pred, FEV1 33% pred, FEV1/FVC 94%, DLCO 34% pred. Consistent with severe restriction, no obstruction and  severe decrease in DLCO. Normal DLCO/FVC ratio. - TTE 11/23/20: Normal LV size with mildly reduced function (LVEF 50%). Normal   Sleep apnea 08/25/2008   Formatting of this note might be different from the original. ICD10 Conversion   History reviewed. No pertinent surgical history.  Social History:  reports that she has never smoked. She has never used smokeless tobacco. She reports that she does not drink alcohol and does not use drugs.   Allergies  Allergen Reactions   Ampicillin Anaphylaxis, Swelling, Palpitations and Rash   Keflex [Cephalexin] Anaphylaxis, Swelling and Other (See Comments)    Throat swelling   Penicillins Anaphylaxis, Swelling, Palpitations and Rash    Has patient had a PCN reaction causing immediate rash, facial/tongue/throat swelling, SOB or lightheadedness with hypotension: Yes Has patient had a PCN reaction causing severe rash involving mucus membranes or skin necrosis: Yes Has patient had a PCN reaction that required hospitalization: Yes Has patient had a PCN reaction occurring within the last 10 years: Yes If all of the above answers are "NO", then may proceed with Cephalosporin use.    Bactrim [Sulfamethoxazole-Trimethoprim] Nausea Only and Rash   Levaquin [Levofloxacin] Itching    Vaginal itching    Family History  Problem Relation Age of Onset   Diabetes Mother    Arthritis Mother    Diabetes Maternal Aunt    Diabetes Maternal Grandfather    Heart disease Maternal Grandfather    Stroke Paternal Grandmother       Prior to Admission medications   Medication Sig Start Date End Date Taking? Authorizing Provider  acetaZOLAMIDE (DIAMOX)  250 MG tablet Take 250 mg by mouth 2 (two) times daily.   Yes [provider]  aspirin EC 81 MG tablet Take 81 mg by mouth daily.   Yes [provider]  atovaquone (MEPRON) 750 MG/5ML suspension Take 10 mLs by mouth in the morning. 05/02/21  Yes [provider]  ergocalciferol (VITAMIN D2)  1.25 MG (50000 UT) capsule Take 50,000 Units by mouth once a week.   Yes [provider]  famotidine (PEPCID) 20 MG tablet Take 20 mg by mouth 2 (two) times daily. 05/18/21  Yes [provider]  hydroxychloroquine (PLAQUENIL) 200 MG tablet Take 1 tablet (200 mg total) by mouth 2 (two) times daily. 01/18/18  Yes Charlann Lange, PA-C  mycophenolate (MYFORTIC) 360 MG TBEC EC tablet Take 720 mg by mouth every 12 (twelve) hours. 02/23/22 02/23/23 Yes [provider]  potassium chloride SA (KLOR-CON M) 20 MEQ tablet Take 20 mEq by mouth 3 (three) times daily. 03/01/22  Yes [provider]  predniSONE (DELTASONE) 20 MG tablet Take 2.5 tablets (50 mg total) by mouth daily with breakfast for 7 days, THEN 2 tablets (40 mg total) daily with breakfast for 7 days, THEN 1.5 tablets (30 mg total) daily with breakfast for 7 days, THEN 1 tablet (20 mg total) daily with breakfast for 7 days, THEN 0.5 tablets (10 mg total) daily with breakfast. 03/27/22 05/24/22 Yes Gherghe, Vella Redhead, MD  torsemide (DEMADEX) 20 MG tablet Take 20 mg by mouth 2 (two) times daily.   Yes [provider]  Treprostinil (TYVASO IN) Inhale 12 puffs into the lungs 4 (four) times daily.   Yes [provider]    Physical Exam: BP 135/83   Pulse (!) 108   Temp 98.4 F (36.9 C) (Oral)   Resp (!) 27   Ht _0  (1.651 m)   Wt 96.8 kg   SpO2 93%   BMI 35.51 kg/m   General: 21 y.o. year-old female well developed well nourished in no acute distress.  Alert and oriented x3. Cardiovascular: Tachycardic with no rubs or gallops.  No thyromegaly or JVD noted.  No lower extremity edema. 2/4 pulses in all 4 extremities. Respiratory: Diffuse rales and wheezing bilaterally.  Poor inspiratory effort. Abdomen: Soft nontender nondistended with normal bowel sounds x4 quadrants. Muskuloskeletal: No cyanosis, clubbing or edema noted bilaterally Neuro: CN II-XII intact, strength, sensation, reflexes Skin: No  ulcerative lesions noted or rashes Psychiatry: Judgement and insight appear normal. Mood is appropriate for condition and setting          Labs on Admission:  Basic Metabolic Panel: Recent Labs  Lab 05/06/22 1340  NA 138  K 3.4*  CL 97*  CO2 32  GLUCOSE 116*  BUN 7  CREATININE 0.69  CALCIUM 8.8*   Liver Function Tests: No results for input(s): "AST", "ALT", "ALKPHOS", "BILITOT", "PROT", "ALBUMIN" in the last 168 hours. No results for input(s): "LIPASE", "AMYLASE" in the last 168 hours. No results for input(s): "AMMONIA" in the last 168 hours. CBC: Recent Labs  Lab 05/06/22 1340  WBC 12.5*  HGB 11.5*  HCT 40.5  MCV 87.5  PLT 368   Cardiac Enzymes: No results for input(s): "CKTOTAL", "CKMB", "CKMBINDEX", "TROPONINI" in the last 168 hours.  BNP (last 3 results) Recent Labs    03/16/22 1402 03/17/22 0515 05/06/22 1340  BNP 61.1 42.7 45.5    ProBNP (last 3 results) No results for input(s): "PROBNP" in the last 8760 hours.  CBG: No results for  input(s): "GLUCAP" in the last 168 hours.  Radiological Exams on Admission: DG Chest 2 View  Result Date: 05/06/2022 CLINICAL DATA:  Productive cough. Short of breath and weakness. History of lupus and hypertension. EXAM: CHEST - 2 VIEW COMPARISON:  03/18/2022.  CT, 02/18/2022. FINDINGS: Cardiac silhouette is normal in size and configuration. No mediastinal or hilar masses.  No evidence of adenopathy. Bilateral interstitial thickening. Hazy intervening ground-glass opacities are noted, most apparent in the left mid to upper lung. These findings are stable from the prior chest radiographs. No pleural effusion or pneumothorax. Skeletal structures are intact. IMPRESSION: 1. No significant change from the prior chest radiographs. Diffuse bilateral interstitial thickening with left greater than right ground-glass opacities. Given the stability, this is all suspected to be due to interstitial lung disease. Acute superimposed infection  is not excluded. Electronically Signed   By: Lajean Manes M.D.   On: 05/06/2022 10:49    EKG: I independently viewed the EKG done and my findings are as followed: Sinus tachycardia rate of 113, nonspecific ST-T changes.  QTc 442.  Assessment/Plan Present on Admission:  Acute on chronic respiratory failure with hypercapnia (HCC)  Principal Problem:   Acute on chronic respiratory failure with hypercapnia (HCC)  Acute on chronic hypoxic and hypercarbic respiratory failure secondary to acute flare of interstitial lung disease, likely from RSV viral infection, POA At baseline, on 5 L nasal cannula at rest and 10 L with ambulation. Currently requiring 10 L to maintain O2 saturation greater than 90%. Initially admitted on 05/06/22, left AGAINST MEDICAL ADVICE on the same day, only to return less than 6 hours later. Resume IV Solu-Medrol and IV Levaquin Resume home immunosuppressants Bronchodilators Incentive spirometer Mobilize as tolerated. Wean off oxygen supplementation as tolerated.  RSV viral infection, POA Respiratory viral panel positive for RSV on 05/06/22 Supportive care, rest of management as stated above  Interstitial lung disease, advanced, with acute flare CO2 retention Personally reviewed chest x-ray done on 05/06/2022, showing diffuse bilateral groundglass opacities, acute superimposed infection is not excluded.  Follows with pulmonary at [facility], next appointment in May 15, 2022 Last VBG 7.25/83/41 on 05/06/2022. Consider DME NIV, trilogy, if PCO2 remains elevated, TOC consulted to assist with DME's if needed. BiPAP tonight and as needed Repeat VBG on 05/07/2022 Resume home regimen for ILD, BiPAP tonight, resuming IV Levaquin and IV Solu-Medrol.  Hypokalemia Serum potassium 3.4 Repleted orally Repeat chemistry panel Obtain magnesium level in the morning.  Left shoulder pain, atraumatic As needed analgesics Reassess in the morning  Pulmonary hypertension Resume  home diuretic and potassium replacement Strict I's and O's and daily weight  GERD Resume home H2 blocker  Obesity BMI 35 Recommend weight loss outpatient with regular physical activity and healthy dieting.  Medical noncompliance With history of leaving [place] on the importance of medical compliance, she was receptive.    Critical care time: 65 minutes.    DVT prophylaxis: Subcu Lovenox daily  Code Status: Full code  Family Communication: None at bedside  Disposition Plan: Admitted to stepdown unit  Consults called: None  Admission status: Inpatient status.   Status is: Inpatient The patient requires at least 2 midnights for further evaluation and treatment of present condition.   Kayleen Memos MD [facility] Pager [phone]  If 7PM-7AM, please contact night-coverage [email or web address] Password [email or web address]  05/07/2022, 3:15 AM

## 2022-05-08 DIAGNOSIS — J9622 Acute and chronic respiratory failure with hypercapnia: Secondary | ICD-10-CM | POA: Diagnosis not present

## 2022-05-08 LAB — BASIC METABOLIC PANEL
Anion gap: 11 (ref 5–15)
BUN: 11 mg/dL (ref 6–20)
CO2: 35 mmol/L — ABNORMAL HIGH (ref 22–32)
Calcium: 9 mg/dL (ref 8.9–10.3)
Chloride: 92 mmol/L — ABNORMAL LOW (ref 98–111)
Creatinine, Ser: 0.57 mg/dL (ref 0.44–1.00)
GFR, Estimated: >60 mL/min (ref >60)
Glucose, Bld: 108 mg/dL — ABNORMAL HIGH (ref 70–99)
Potassium: 4.1 mmol/L (ref 3.5–5.1)
Sodium: 138 mmol/L (ref 135–145)

## 2022-05-08 LAB — CBC
HCT: 38 % (ref 36.0–46.0)
Hemoglobin: 11 g/dL — ABNORMAL LOW (ref 12.0–15.0)
MCH: 25.1 pg — ABNORMAL LOW (ref 26.0–34.0)
MCHC: 28.9 g/dL — ABNORMAL LOW (ref 30.0–36.0)
MCV: 86.8 fL (ref 80.0–100.0)
Platelets: 377 10*3/uL (ref 150–400)
RBC: 4.38 MIL/uL (ref 3.87–5.11)
RDW: 16.4 % — ABNORMAL HIGH (ref 11.5–15.5)
WBC: 12.9 10*3/uL — ABNORMAL HIGH (ref 4.0–10.5)
nRBC: 0 % (ref 0.0–0.2)

## 2022-05-08 MED ORDER — GUAIFENESIN ER 600 MG PO TB12
600.0000 mg | ORAL_TABLET | Freq: Two times a day (BID) | ORAL | Status: AC
  Administered 2022-05-08 – 2022-05-09 (×2): 600 mg via ORAL
  Filled 2022-05-08 (×2): qty 1

## 2022-05-08 MED ORDER — IPRATROPIUM-ALBUTEROL 0.5-2.5 (3) MG/3ML IN SOLN
3.0000 mL | Freq: Four times a day (QID) | RESPIRATORY_TRACT | Status: DC
  Administered 2022-05-09 – 2022-05-10 (×5): 3 mL via RESPIRATORY_TRACT
  Filled 2022-05-08 (×4): qty 3
  Filled 2022-05-08: qty 6

## 2022-05-08 MED ORDER — PHENOL 1.4 % MT LIQD
1.0000 | OROMUCOSAL | Status: DC | PRN
  Administered 2022-05-08: 1 via OROMUCOSAL
  Filled 2022-05-08: qty 177

## 2022-05-08 NOTE — Progress Notes (Signed)
Rhonda Franco  RWE:[id number] DOB: 07/22/2000 DOA: 05/07/2022 PCP: Myrtie Neither, PA-C    Brief Narrative:  21 year old with a history of SLE, advanced ILD, chronic hypoxic and hypercarbic respiratory failure requiring baseline of 5 L nasal cannula at rest and 10 L with ambulation, PAH, and obesity who returned to the ER after leaving the hospital AMA 6 hours prior with worsening shortness of breath.  During her prior hospital stay she was being treated for respiratory failure due to RSV.  At the time of her return she was noted to have a respiratory rate of 27, pulse rate of 108, and oxygen saturations 93% on 10 L high flow nasal cannula.  Consultants:  None  Goals of Care:  Code Status: Full Code   DVT prophylaxis: Lovenox  Interim Hx: No acute events reported overnight.  Afebrile.  Some mild tachypnea with respiratory rates as high as 33.  Blood pressure stable.  Tolerated BiPAP throughout the night.  Does not have CPAP or BiPAP at home.  Reports that she still short of breath above her baseline, but feels that she is slowly improving.  Denies chest pain nausea vomiting or abdominal pain.  Assessment & Plan:  Acute on chronic hypoxic and hypercarbic respiratory failure - acute ILD flare due to RSV infection Baseline O2 need is 5 L nasal cannula at rest w/ 10 L with exertion - currently requiring 10-12 L even at rest - continue Solu-Medrol which was initiated during prior hospital stay - cont usual home immunosuppressants and inhaled medications - Levaquin discontinued as this appears to primarily be a viral issue  RSV URI POA Tested + 05/06/2022 -supportive care as above  SLP  - Advanced chronic ILD with PAH Followed at [facility] pulmonary clinic with next appointment 05/15/2022 -continue usual home medical regimen  Hypokalemia Corrected with supplementation and stable  GERD Continue usual PPI  Obesity - Body mass index is 35.88 kg/m.  Medical noncompliance Was admitted to  the hospital 10/29 and left AMA later that day - prior admission 9/6-9/18/2023 when she also left AMA  Disposition: From home - anticipate eventual return to home  -would benefit from home BIPAP for QHS use   Objective: Blood pressure 119/82, pulse 85, temperature 98.5 F (36.9 C), temperature source Axillary, resp. rate (!) 21, height _0  (1.651 m), weight 97.8 kg, SpO2 98 %.  Intake/Output Summary (Last 24 hours) at 05/08/2022 0753 Last data filed at 05/07/2022 0859 Gross per 24 hour  Intake 360 ml  Output --  Net 360 ml    Filed Weights   05/07/22 0300 05/08/22 0600  Weight: 96.8 kg 97.8 kg    Examination: General: No acute respiratory distress Lungs: Fine crackles scattered diffusely with no wheezing Cardiovascular: Heart sounds distant rate without murmur rub Abdomen: Overweight, soft, bowel sounds positive, no mass or rebound Extremities: Trace bilateral lower extremity edema   CBC: Recent Labs  Lab 05/06/22 1340 05/07/22 0515 05/08/22 0316  WBC 12.5* 11.7* 12.9*  NEUTROABS  --  10.5*  --   HGB 11.5* 10.8* 11.0*  HCT 40.5 37.2 38.0  MCV 87.5 86.5 86.8  PLT 368 332 619    Basic Metabolic Panel: Recent Labs  Lab 05/06/22 1340 05/07/22 0515 05/08/22 0316  NA 138 134* 138  K 3.4* 4.4 4.1  CL 97* 97* 92*  CO2 32 30 35*  GLUCOSE 116* 118* 108*  BUN _1 CREATININE 0.69 0.42* 0.57  CALCIUM 8.8* 9.1 9.0  MG  --  1.9  --   PHOS  --  4.7*  --     GFR: Estimated Creatinine Clearance: 127.6 mL/min (by C-G formula based on SCr of 0.57 mg/dL).   Scheduled Meds:  acetaZOLAMIDE  250 mg Oral BID   aspirin EC  81 mg Oral Daily   atovaquone  1,500 mg Oral q AM   Chlorhexidine Gluconate Cloth  6 each Topical Daily   enoxaparin (LOVENOX) injection  40 mg Subcutaneous Q24H   famotidine  20 mg Oral BID   hydroxychloroquine  200 mg Oral BID   ipratropium-albuterol  3 mL Nebulization Q6H   methylPREDNISolone (SOLU-MEDROL) injection  40 mg Intravenous BID    mycophenolate  720 mg Oral Q12H   potassium chloride  20 mEq Oral BID   torsemide  20 mg Oral BID   Treprostinil  18 mcg Inhalation QID   Vitamin D (Ergocalciferol)  50,000 Units Oral Weekly      LOS: 1 day   Cherene Altes, MD [facility] Office  [phone] Pager - Text Page per Amion  If 7PM-7AM, please contact night-coverage per Amion 05/08/2022, 7:53 AM

## 2022-05-08 NOTE — TOC Progression Note (Signed)
Transition of Care [facility]) - Progression Note    Patient Details  Name: Rhonda Franco MRN: [id number] Date of Birth: [birth year]  [facility]) CM/SW Contact  Purcell Mouton, RN Phone Number: 05/08/2022, 12:15 PM  Clinical Narrative:     Pt from home alone, follow by [facility] for DME.   Expected Discharge Plan: Home/Self Care Barriers to Discharge: No Barriers Identified  Expected Discharge Plan and Services Expected Discharge Plan: Home/Self Care     Post Acute Care Choice: Durable Medical Equipment Living arrangements for the past 2 months: Single Family Home                   DME Agency: [facility]                   Social Determinants of Health (SDOH) Interventions    Readmission Risk Interventions     No data to display

## 2022-05-09 DIAGNOSIS — I2721 Secondary pulmonary arterial hypertension: Secondary | ICD-10-CM

## 2022-05-09 DIAGNOSIS — J9622 Acute and chronic respiratory failure with hypercapnia: Secondary | ICD-10-CM | POA: Diagnosis not present

## 2022-05-09 DIAGNOSIS — J121 Respiratory syncytial virus pneumonia: Principal | ICD-10-CM

## 2022-05-09 LAB — COMPREHENSIVE METABOLIC PANEL
ALT: 11 U/L (ref 0–44)
AST: 23 U/L (ref 15–41)
Albumin: 3 g/dL — ABNORMAL LOW (ref 3.5–5.0)
Alkaline Phosphatase: 50 U/L (ref 38–126)
Anion gap: 10 (ref 5–15)
BUN: 13 mg/dL (ref 6–20)
CO2: 36 mmol/L — ABNORMAL HIGH (ref 22–32)
Calcium: 8.8 mg/dL — ABNORMAL LOW (ref 8.9–10.3)
Chloride: 91 mmol/L — ABNORMAL LOW (ref 98–111)
Creatinine, Ser: 0.6 mg/dL (ref 0.44–1.00)
GFR, Estimated: >60 mL/min (ref >60)
Glucose, Bld: 114 mg/dL — ABNORMAL HIGH (ref 70–99)
Potassium: 3.8 mmol/L (ref 3.5–5.1)
Sodium: 137 mmol/L (ref 135–145)
Total Bilirubin: 0.5 mg/dL (ref 0.3–1.2)
Total Protein: 6.5 g/dL (ref 6.5–8.1)

## 2022-05-09 LAB — CBC
HCT: 37.4 % (ref 36.0–46.0)
Hemoglobin: 11 g/dL — ABNORMAL LOW (ref 12.0–15.0)
MCH: 24.9 pg — ABNORMAL LOW (ref 26.0–34.0)
MCHC: 29.4 g/dL — ABNORMAL LOW (ref 30.0–36.0)
MCV: 84.6 fL (ref 80.0–100.0)
Platelets: 306 10*3/uL (ref 150–400)
RBC: 4.42 MIL/uL (ref 3.87–5.11)
RDW: 16.1 % — ABNORMAL HIGH (ref 11.5–15.5)
WBC: 10.6 10*3/uL — ABNORMAL HIGH (ref 4.0–10.5)
nRBC: 0 % (ref 0.0–0.2)

## 2022-05-09 LAB — MAGNESIUM: Magnesium: 2.2 mg/dL (ref 1.7–2.4)

## 2022-05-09 MED ORDER — GUAIFENESIN ER 600 MG PO TB12
600.0000 mg | ORAL_TABLET | Freq: Two times a day (BID) | ORAL | Status: DC
  Administered 2022-05-09 – 2022-05-11 (×5): 600 mg via ORAL
  Filled 2022-05-09 (×5): qty 1

## 2022-05-09 NOTE — Hospital Course (Addendum)
  21 year old female with complex medical history of SLE advanced interstitial lung disease with chronic hypoxia hypercapnia respiratory failure on 5 L nasal cannula at rest/10 L with ambulation, pulmonary hypertension class II obesity BMI 35.8, recurrent history of leaving AMA admitted with working diagnosis of acute on chronic respiratory failure/RSV infection  Initially admitted 9/6-9/18: Left AMA Again admitted on 10/29 for acute on chronic respiratory failure with hypoxia oxygen/hypercapnea , tested positive for RSV>but left AMA from ED as she felt better. 10/30-6 hours later, presented with worsening shortness of breath, tachypnea with respiration 27 tachycardia 103, needing 10 L oxygen to maintain saturation 93% and readmitted on respiratory support/BiPAP, systemic steroids. cxr 10/29-diffuse bilateral interstitial thickening with left greater than right groundglass opacities no significant change from previous chest x-ray CT in September and August. Seen by pulmonary at this time respiratory status overall stability 2 L embolism, far better at rest. She will continue oral steroid taper, home BiPAP which is being arranged by [facility]monary signed off and is stable for discharge once BiPAP is arranged

## 2022-05-09 NOTE — Progress Notes (Signed)
PROGRESS NOTE Rhonda Franco  HBZ:[id number] DOB: [birth year] DOA: 05/07/2022 PCP: Myrtie Neither, PA-C   Brief Narrative/Hospital Course:  21 year old female with complex medical history of SLE advanced interstitial lung disease with chronic hypoxia hypercapnia respiratory failure on 5 L nasal cannula at rest/10 L with ambulation, pulmonary hypertension class II obesity BMI 35.8, recurrent history of leaving AMA admitted with working diagnosis of acute on chronic respiratory failure/RSV infection  Initially admitted 9/6-9/18: Left AMA Again admitted on 10/29 for acute on chronic respiratory failure with hypoxia oxygen/hypercapnea , tested positive for RSV>but left AMA from ED as she felt better. 10/30-6 hours later, presented with worsening shortness of breath, tachypnea with respiration 27 tachycardia 103, needing 10 L oxygen to maintain saturation 93% and readmitted on respiratory support/BiPAP, systemic steroids.     Subjective: Seen and examined. Able to speak in full sentence not in respiratory distress, able to use bedside commode, but has hypoxia which is not new for her she says Overnight afebrile, remains tachypneic in high 20s to low 30s, SPO2 96% on BiPAP with 60% FiO2.  Vitals stable labs with fairly stable CMP/CBC Complains of cough congestion stuffy nose Denies chest pain nausea vomiting   Assessment and Plan: Principal Problem:   Acute on chronic respiratory failure with hypercapnia (HCC)   Acute on chronic hypoxic and hypercapnic respiratory failure Acute ILD feel flareup SLE w/ advanced interstitial lung disease-5 L Buda at rest/10 L on ambulation Pulmonary hypertension Acute RSV infection: RVP positive for RSV infection, cxr 10/29-diffuse bilateral interstitial thickening with left greater than right groundglass opacities no significant change from previous chest x-ray CT in September and August.  Continue with current IV Solu-Medrol 40 every 12hr (report she was on  steroid taper but not on steroid chronically).  Add Flonase/Claritin, continue mycophenolate,Treprostinil inhaler, atovaquone, Diamox, DuoNeb.  Afebrile.  Off Levaquin.  Continue torsemide (BNP 45 nl- on admission, has wt gain 94.9>97.8 since sept, no edema).  Overall very slow to improve-continues to be on BiPAP at bedtime ,given her complex pulmonary issues-?Prolonged prednisone taper need-consult pulmonology. Net IO Since Admission: 750 mL [05/09/22 1016]   SLE with advanced chronic ILD with pH, followed at [facility] pulmonary clinic next appointment 11/7.  On multiple home regimen as above Anemia suspect from chronic disease stable 10 to 11 g. GERD: Continue PPI especially while on steroid Hypokalemia: Stable Class II obesity with BMI 35, will plan for close definitely Noncompliance /tendency to leave AMA multiple times  DVT prophylaxis: enoxaparin (LOVENOX) injection 40 mg Start: 05/07/22 1000 Code Status:   Code Status: Full Code Family Communication: plan of care discussed with patient/no family at bedside. Patient status is: Inpatient because of severe respiratory failure Level of care: Stepdown   Dispo: The patient is from: Home            Anticipated disposition: Home >3 days  Mobility Assessment (last 72 hours)     Mobility Assessment   No documentation.          Objective: Vitals last 24 hrs: Vitals:   05/09/22 0319 05/09/22 0400 05/09/22 0500 05/09/22 0700  BP:  120/67 121/67 122/66  Pulse:  75 77 71  Resp: (!) 29 (!) 29 (!) 29 (!) 29  Temp: 98.8 F (37.1 C)     TempSrc: Axillary     SpO2:  100% 96% 96%  Weight:      Height:       Weight change:   Physical Examination: General exam: alert awake, appears older  than stated age HEENT:Oral mucosa moist, Ear/Nose WNL grossly Respiratory system: bilaterally basal crackles no wheezing,no use of accessory muscle Cardiovascular system: S1 & S2 +, No JVD. Gastrointestinal system: Abdomen soft,NT,ND, BS+ Nervous  System:Alert, awake, moving extremities. Extremities: LE edema neg,distal peripheral pulses palpable.  Skin: No rashes,no icterus. MSK: Normal muscle bulk,tone, power  Medications reviewed:  Scheduled Meds:  acetaZOLAMIDE  250 mg Oral BID   aspirin EC  81 mg Oral Daily   atovaquone  1,500 mg Oral q AM   Chlorhexidine Gluconate Cloth  6 each Topical Daily   enoxaparin (LOVENOX) injection  40 mg Subcutaneous Q24H   famotidine  20 mg Oral BID   hydroxychloroquine  200 mg Oral BID   ipratropium-albuterol  3 mL Nebulization Q6H WA   methylPREDNISolone (SOLU-MEDROL) injection  40 mg Intravenous BID   mycophenolate  720 mg Oral Q12H   potassium chloride  20 mEq Oral BID   torsemide  20 mg Oral BID   Treprostinil  18 mcg Inhalation QID   Vitamin D (Ergocalciferol)  50,000 Units Oral Weekly   Continuous Infusions:  Intake/Output Summary (Last 24 hours) at 05/09/2022 1015 Last data filed at 05/08/2022 1600 Gross per 24 hour  Intake 240 ml  Output --  Net 240 ml   Net IO Since Admission: 750 mL [05/09/22 1015]  Wt Readings from Last 3 Encounters:  05/08/22 97.8 kg  03/20/22 94.9 kg  11/13/21 97.1 kg     Unresulted Labs (From admission, onward)    None     Data Reviewed: I have personally reviewed following labs and imaging studies CBC: Recent Labs  Lab 05/06/22 1340 05/07/22 0515 05/08/22 0316 05/09/22 0308  WBC 12.5* 11.7* 12.9* 10.6*  NEUTROABS  --  10.5*  --   --   HGB 11.5* 10.8* 11.0* 11.0*  HCT 40.5 37.2 38.0 37.4  MCV 87.5 86.5 86.8 84.6  PLT 368 332 377 826   Basic Metabolic Panel: Recent Labs  Lab 05/06/22 1340 05/07/22 0515 05/08/22 0316 05/09/22 0308  NA 138 134* 138 137  K 3.4* 4.4 4.1 3.8  CL 97* 97* 92* 91*  CO2 32 30 35* 36*  GLUCOSE 116* 118* 108* 114*  BUN _0 CREATININE 0.69 0.42* 0.57 0.60  CALCIUM 8.8* 9.1 9.0 8.8*  MG  --  1.9  --  2.2  PHOS  --  4.7*  --   --    GFR: Estimated Creatinine Clearance: 127.6 mL/min (by C-G  formula based on SCr of 0.6 mg/dL). Liver Function Tests: Recent Labs  Lab 05/07/22 0515 05/09/22 0308  AST 24 23  ALT 13 11  ALKPHOS 54 50  BILITOT 0.3 0.5  PROT 6.7 6.5  ALBUMIN 2.8* 3.0*  Sepsis Labs: Recent Labs  Lab 05/06/22 1340  LATICACIDVEN 0.9    Recent Results (from the past 240 hour(s))  Resp Panel by RT-PCR (Flu A&B, Covid) Anterior Nasal Swab     Status: None   Collection Time: 05/06/22 10:10 AM   Specimen: Anterior Nasal Swab  Result Value Ref Range Status   SARS Coronavirus 2 by RT PCR NEGATIVE NEGATIVE Final    Comment: (NOTE) SARS-CoV-2 target nucleic acids are NOT DETECTED.  The SARS-CoV-2 RNA is generally detectable in upper respiratory specimens during the acute phase of infection. The lowest concentration of SARS-CoV-2 viral copies this assay can detect is 138 copies/mL. A negative result does not preclude SARS-Cov-2 infection and should not be used as the sole basis  for treatment or other patient management decisions. A negative result may occur with  improper specimen collection/handling, submission of specimen other than nasopharyngeal swab, presence of viral mutation(s) within the areas targeted by this assay, and inadequate number of viral copies(<138 copies/mL). A negative result must be combined with clinical observations, patient history, and epidemiological information. The expected result is Negative.  Fact Sheet for Patients:  EntrepreneurPulse.com.au  Fact Sheet for Healthcare Providers:  IncredibleEmployment.be  This test is no t yet approved or cleared by the [place] FDA and  has been authorized for detection and/or diagnosis of SARS-CoV-2 by FDA under an Emergency Use Authorization (EUA). This EUA will remain  in effect (meaning this test can be used) for the duration of the COVID-19 declaration under Section 564(b)(1) of the Act, 21 U.S.C.section 360bbb-3(b)(1), unless the authorization  is terminated  or revoked sooner.       Influenza A by PCR NEGATIVE NEGATIVE Final   Influenza B by PCR NEGATIVE NEGATIVE Final    Comment: (NOTE) The Xpert Xpress SARS-CoV-2/FLU/RSV plus assay is intended as an aid in the diagnosis of influenza from Nasopharyngeal swab specimens and should not be used as a sole basis for treatment. Nasal washings and aspirates are unacceptable for Xpert Xpress SARS-CoV-2/FLU/RSV testing.  Fact Sheet for Patients: EntrepreneurPulse.com.au  Fact Sheet for Healthcare Providers: IncredibleEmployment.be  This test is not yet approved or cleared by the [place] FDA and has been authorized for detection and/or diagnosis of SARS-CoV-2 by FDA under an Emergency Use Authorization (EUA). This EUA will remain in effect (meaning this test can be used) for the duration of the COVID-19 declaration under Section 564(b)(1) of the Act, 21 U.S.C. section 360bbb-3(b)(1), unless the authorization is terminated or revoked.  Performed at [facility], [place] [address], [place], [place] [address]   Respiratory (~20 pathogens) panel by PCR     Status: Abnormal   Collection Time: 05/06/22  6:05 PM   Specimen: Nasopharyngeal Swab; Respiratory  Result Value Ref Range Status   Adenovirus NOT DETECTED NOT DETECTED Final   Coronavirus 229E NOT DETECTED NOT DETECTED Final    Comment: (NOTE) The Coronavirus on the Respiratory Panel, DOES NOT test for the novel  Coronavirus (2019 nCoV)    Coronavirus HKU1 NOT DETECTED NOT DETECTED Final   Coronavirus NL63 NOT DETECTED NOT DETECTED Final   Coronavirus OC43 NOT DETECTED NOT DETECTED Final   Metapneumovirus NOT DETECTED NOT DETECTED Final   Rhinovirus / Enterovirus NOT DETECTED NOT DETECTED Final   Influenza A NOT DETECTED NOT DETECTED Final   Influenza B NOT DETECTED NOT DETECTED Final   Parainfluenza Virus 1 NOT DETECTED NOT DETECTED Final   Parainfluenza Virus  2 NOT DETECTED NOT DETECTED Final   Parainfluenza Virus 3 NOT DETECTED NOT DETECTED Final   Parainfluenza Virus 4 NOT DETECTED NOT DETECTED Final   Respiratory Syncytial Virus DETECTED (A) NOT DETECTED Final   Bordetella pertussis NOT DETECTED NOT DETECTED Final   Bordetella Parapertussis NOT DETECTED NOT DETECTED Final   Chlamydophila pneumoniae NOT DETECTED NOT DETECTED Final   Mycoplasma pneumoniae NOT DETECTED NOT DETECTED Final    Comment: Performed at [facility], [place]. [address], [place], [place] [address]  MRSA Next Gen by PCR, Nasal     Status: None   Collection Time: 05/07/22  2:59 AM   Specimen: Nasal Mucosa; Nasal Swab  Result Value Ref Range Status   MRSA by PCR Next Gen NOT DETECTED NOT DETECTED Final  Comment: (NOTE) The GeneXpert MRSA Assay (FDA approved for NASAL specimens only), is one component of a comprehensive MRSA colonization surveillance program. It is not intended to diagnose MRSA infection nor to guide or monitor treatment for MRSA infections. Test performance is not FDA approved in patients less than 77 years old. Performed at [facility], [place] [address] [place], [place] [address]     Antimicrobials: Anti-infectives (From admission, onward)    Start     Dose/Rate Route Frequency Ordered Stop   05/07/22 0700  atovaquone (MEPRON) 750 MG/5ML suspension 1,500 mg        1,500 mg Oral Every morning 05/07/22 0313     05/07/22 0430  hydroxychloroquine (PLAQUENIL) tablet 200 mg        200 mg Oral 2 times daily 05/07/22 0330     05/07/22 0415  levofloxacin (LEVAQUIN) IVPB 750 mg  Status:  Discontinued        750 mg 100 mL/hr over 90 Minutes Intravenous Every 24 hours 05/07/22 0329 05/07/22 1148      Culture/Microbiology    Component Value Date/Time   SDES THROAT 03/05/2017 0914   SPECREQUEST NONE Reflexed from [id number] 03/05/2017 0914   CULT NO GROUP A STREP (S.PYOGENES) ISOLATED 03/05/2017 0914   REPTSTATUS 03/07/2017 FINAL  03/05/2017 0914    Radiology Studies: No results found.   LOS: 2 days   Antonieta Pert, MD [facility]  05/09/2022, 10:15 AM

## 2022-05-09 NOTE — Consult Note (Signed)
NAMERosali Franco, MRN:  [id number], DOB:  [birth year], LOS: 2 ADMISSION DATE:  05/07/2022, CONSULTATION DATE:  05/09/22 REFERRING MD:  Lupita Leash - [facility] , CHIEF COMPLAINT:  Hypoxia    History of Present Illness:  21 yo F PMH SLE, ILD, chronic hypoxic respiratory failure on home 5L at rest 10L on exertion, pulmonary HTN, obesity, admitted 10/29 to [facility] with SOB and incr cough. Sx progressed for 4-5d prior to presentation.   Started on solumedrol, levaquin in ED. Left AMA hoping to f/u in pulm office.  Came back to ED 10/30 for hypoxia SpO2 80s once she got home. Admitted to [facility]. RSV positive. Levaquin was dc in this setting.   11/1 PCCM consulted    Pertinent  Medical History  SLE ILD Chronic hypoxemic respiratory failure Pulmonary hypertension Obesity   Significant Hospital Events: Including procedures, antibiotic start and stop dates in addition to other pertinent events   10/29 ED with SOB, started on steroids levaquin admit to [facility] left AMA while still in ED 10/30 back to ED with hypoxia. Admit to [facility]. + for RSV 10/31 dc levaquin.  11/1 PCCM consult   Interim History / Subjective:  Weaned from BiPAP to HFNC  Was able to wean to 5L with SpO2 93, until she coughed and desatted to 70s. Slow to recover but does come up. Put back on 7 L   Objective   Blood pressure 122/66, pulse 71, temperature 98.8 F (37.1 C), temperature source Axillary, resp. rate (!) 29, height _0  (1.651 m), weight 97.8 kg, SpO2 96 %.    FiO2 (%):  [60 %] 60 %   Intake/Output Summary (Last 24 hours) at 05/09/2022 1208 Last data filed at 05/08/2022 1600 Gross per 24 hour  Intake 240 ml  Output --  Net 240 ml   Filed Weights   05/07/22 0300 05/08/22 0600  Weight: 96.8 kg 97.8 kg    Examination: General: chronically ill young adult F NAD  HENT: NCAT pink mm anicteric sclera  Lungs: Bilateral crackles. Symmetrical chest expansion. 7L HFNC Cardiovascular: tachycardic, regular  Abdomen: soft ndnt   Extremities: no acute joint deformity. Trace edema  Neuro: AAOx3 following commands GU: defer   Resolved Hospital Problem list     Assessment & Plan:   Acute on chronic hypoxic and hypercarbic respiratory failure RSV infection  ILD PAH Chronic right sided heart failure  Severe TR SLE  -at home is 5L at rest 10L on exertion -this admission w incr O2 req, and BiPAP overnight  P -cont tyvaso, diamox, demadex -if clinically there becomes concern for hypervolemia, consider incr diuretic. Right now will keep as is -cont BID solumedrol. Hopefully transition to PO in coming days  -mucinex -pulm hygiene, mobility, IS -wean O2 as able -- she will have incr need with exertion and will be slow to recover after exertion.  -PRN BiPAP -PRN CXR -mepron ppx -plaquenil, cellcept -follows w [facility] and [facility]. Is scheduled OP w pulm 11/7    PCCM will continue to follow   Best Practice (right click and "Reselect all SmartList Selections" daily)   Per primary   Labs   CBC: Recent Labs  Lab 05/06/22 1340 05/07/22 0515 05/08/22 0316 05/09/22 0308  WBC 12.5* 11.7* 12.9* 10.6*  NEUTROABS  --  10.5*  --   --   HGB 11.5* 10.8* 11.0* 11.0*  HCT 40.5 37.2 38.0 37.4  MCV 87.5 86.5 86.8 84.6  PLT 368 332 377 306  Basic Metabolic Panel: Recent Labs  Lab 05/06/22 1340 05/07/22 0515 05/08/22 0316 05/09/22 0308  NA 138 134* 138 137  K 3.4* 4.4 4.1 3.8  CL 97* 97* 92* 91*  CO2 32 30 35* 36*  GLUCOSE 116* 118* 108* 114*  BUN _0 CREATININE 0.69 0.42* 0.57 0.60  CALCIUM 8.8* 9.1 9.0 8.8*  MG  --  1.9  --  2.2  PHOS  --  4.7*  --   --    GFR: Estimated Creatinine Clearance: 127.6 mL/min (by C-G formula based on SCr of 0.6 mg/dL). Recent Labs  Lab 05/06/22 1340 05/07/22 0515 05/08/22 0316 05/09/22 0308  WBC 12.5* 11.7* 12.9* 10.6*  LATICACIDVEN 0.9  --   --   --     Liver Function Tests: Recent Labs  Lab 05/07/22 0515 05/09/22 0308  AST 24 23  ALT 13  11  ALKPHOS 54 50  BILITOT 0.3 0.5  PROT 6.7 6.5  ALBUMIN 2.8* 3.0*   No results for input(s): "LIPASE", "AMYLASE" in the last 168 hours. No results for input(s): "AMMONIA" in the last 168 hours.  ABG    Component Value Date/Time   PHART 7.48 (H) 03/25/2022 2010   PCO2ART 45 03/25/2022 2010   PO2ART 69 (L) 03/25/2022 2010   HCO3 34.4 (H) 05/07/2022 0515   O2SAT 98.8 05/07/2022 0515     Coagulation Profile: No results for input(s): "INR", "PROTIME" in the last 168 hours.  Cardiac Enzymes: No results for input(s): "CKTOTAL", "CKMB", "CKMBINDEX", "TROPONINI" in the last 168 hours.  HbA1C: Hgb A1c MFr Bld  Date/Time Value Ref Range Status  03/14/2022 12:59 PM 5.7 (H) 4.8 - 5.6 % Final    Comment:    (NOTE) Pre diabetes:          5.7%-6.4%  Diabetes:              >6.4%  Glycemic control for   <7.0% adults with diabetes   08/17/2019 12:15 PM 5.5 4.8 - 5.6 % Final    Comment:             Prediabetes: 5.7 - 6.4          Diabetes: >6.4          Glycemic control for adults with diabetes: <7.0     CBG: No results for input(s): "GLUCAP" in the last 168 hours.  Review of Systems:   Review of Systems  Constitutional:  Positive for malaise/fatigue.  HENT: Negative.    Eyes: Negative.   Respiratory:  Positive for cough, sputum production and shortness of breath.   Cardiovascular: Negative.   Gastrointestinal: Negative.   Genitourinary: Negative.   Musculoskeletal: Negative.   Skin: Negative.   Neurological: Negative.   Endo/Heme/Allergies: Negative.      Past Medical History:  She,  has a past medical history of Anemia, Class 2 obesity (03/14/2022), Lupus ([place]), Prediabetes, Pulmonary hypertension ([place]) (10/21/2021), and Sleep apnea (08/25/2008).   Surgical History:  History reviewed. No pertinent surgical history.   Social History:   reports that she has never smoked. She has never used smokeless tobacco. She reports that she does not drink alcohol and does not  use drugs.   Family History:  Her family history includes Arthritis in her mother; Diabetes in her maternal aunt, maternal grandfather, and mother; Heart disease in her maternal grandfather; Stroke in her paternal grandmother.   Allergies Allergies  Allergen Reactions   Ampicillin Anaphylaxis, Swelling, Palpitations and Rash  Keflex [Cephalexin] Anaphylaxis, Swelling and Other (See Comments)    Throat swelling   Penicillins Anaphylaxis, Swelling, Palpitations and Rash    Has patient had a PCN reaction causing immediate rash, facial/tongue/throat swelling, SOB or lightheadedness with hypotension: Yes Has patient had a PCN reaction causing severe rash involving mucus membranes or skin necrosis: Yes Has patient had a PCN reaction that required hospitalization: Yes Has patient had a PCN reaction occurring within the last 10 years: Yes If all of the above answers are "NO", then may proceed with Cephalosporin use.    Bactrim [Sulfamethoxazole-Trimethoprim] Nausea Only and Rash   Levaquin [Levofloxacin] Itching    Vaginal itching     Home Medications  Prior to Admission medications   Medication Sig Start Date End Date Taking? Authorizing Provider  acetaZOLAMIDE (DIAMOX) 250 MG tablet Take 250 mg by mouth 2 (two) times daily.   Yes [provider]  aspirin EC 81 MG tablet Take 81 mg by mouth daily.   Yes [provider]  atovaquone (MEPRON) 750 MG/5ML suspension Take 10 mLs by mouth in the morning. 05/02/21  Yes [provider]  ergocalciferol (VITAMIN D2) 1.25 MG (50000 UT) capsule Take 50,000 Units by mouth once a week.   Yes [provider]  famotidine (PEPCID) 20 MG tablet Take 20 mg by mouth 2 (two) times daily. 05/18/21  Yes [provider]  hydroxychloroquine (PLAQUENIL) 200 MG tablet Take 1 tablet (200 mg total) by mouth 2 (two) times daily. 01/18/18  Yes Charlann Lange, PA-C  mycophenolate (MYFORTIC) 360 MG TBEC EC tablet Take 720 mg by  mouth every 12 (twelve) hours. 02/23/22 02/23/23 Yes [provider]  potassium chloride SA (KLOR-CON M) 20 MEQ tablet Take 20 mEq by mouth 3 (three) times daily. 03/01/22  Yes [provider]  torsemide (DEMADEX) 20 MG tablet Take 20 mg by mouth 2 (two) times daily.   Yes [provider]  Treprostinil (TYVASO IN) Inhale 12 puffs into the lungs 4 (four) times daily.   Yes [provider]     Eliseo Gum MSN, AGACNP-BC [place] for pager  05/09/2022, 12:08 PM

## 2022-05-10 DIAGNOSIS — J9622 Acute and chronic respiratory failure with hypercapnia: Secondary | ICD-10-CM | POA: Diagnosis not present

## 2022-05-10 MED ORDER — FLUTICASONE PROPIONATE 50 MCG/ACT NA SUSP
2.0000 | Freq: Every day | NASAL | Status: DC
  Administered 2022-05-10 – 2022-05-11 (×2): 2 via NASAL
  Filled 2022-05-10: qty 16

## 2022-05-10 MED ORDER — IPRATROPIUM-ALBUTEROL 0.5-2.5 (3) MG/3ML IN SOLN
3.0000 mL | Freq: Three times a day (TID) | RESPIRATORY_TRACT | Status: DC
  Administered 2022-05-11 (×2): 3 mL via RESPIRATORY_TRACT
  Filled 2022-05-10: qty 3

## 2022-05-10 MED ORDER — PREDNISONE 20 MG PO TABS
40.0000 mg | ORAL_TABLET | Freq: Every day | ORAL | Status: DC
  Administered 2022-05-11: 40 mg via ORAL
  Filled 2022-05-10: qty 2

## 2022-05-10 MED ORDER — LORATADINE 10 MG PO TABS
10.0000 mg | ORAL_TABLET | Freq: Every day | ORAL | Status: DC
  Administered 2022-05-10 – 2022-05-11 (×2): 10 mg via ORAL
  Filled 2022-05-10 (×2): qty 1

## 2022-05-10 MED ORDER — PREDNISONE 20 MG PO TABS: 20.0000 mg | ORAL_TABLET | Freq: Every day | ORAL | Status: DC

## 2022-05-10 NOTE — TOC Progression Note (Addendum)
Transition of Care [facility]) - Progression Note    Patient Details  Name: Rhonda Franco MRN: [id number] Date of Birth: 07-22-2000  Transition of Care [facility]) CM/SW [place], RN Phone Number: 05/10/2022, 8:06 AM  Clinical Narrative:   Received TOC consult for:  Trilogy NIV prior to discharge, patient reported to have chronic oxygen retention. Patient being followed by [facility] for Trilogy.  TOC will continue to follow for discharge needs.  - 12:45pm Notified [facility] for home trilogy set up, awaiting response.  Expected Discharge Plan: Home/Self Care Barriers to Discharge: No Barriers Identified  Expected Discharge Plan and Services Expected Discharge Plan: Home/Self Care     Post Acute Care Choice: Durable Medical Equipment Living arrangements for the past 2 months: Single Family Home                   DME Agency: [facility]                   Social Determinants of Health (SDOH) Interventions    Readmission Risk Interventions     No data to display

## 2022-05-10 NOTE — Consult Note (Signed)
NAMEJaniyah Franco, MRN:  [id number], DOB:  2000/10/21, LOS: 3 ADMISSION DATE:  05/07/2022, CONSULTATION DATE:  05/09/22 REFERRING MD:  Lupita Leash - TRH , CHIEF COMPLAINT:  Hypoxia    History of Present Illness:  21 yo F PMH SLE, ILD, chronic hypoxic respiratory failure on home 5L at rest 10L on exertion, pulmonary HTN, obesity, admitted 10/29 to [facility] with SOB and incr cough. Sx progressed for 4-5d prior to presentation.   Started on solumedrol, levaquin in ED. Left AMA hoping to f/u in pulm office.  Came back to ED 10/30 for hypoxia SpO2 80s once she got home. Admitted to [facility]. RSV positive. Levaquin was dc in this setting.   11/1 PCCM consulted    Pertinent  Medical History  SLE ILD Chronic hypoxemic respiratory failure Pulmonary hypertension Obesity   Significant Hospital Events: Including procedures, antibiotic start and stop dates in addition to other pertinent events   10/29 ED with SOB, started on steroids levaquin admit to [facility] left AMA while still in ED 10/30 back to ED with hypoxia. Admit to [facility]. + for RSV 10/31 dc levaquin.  11/1 PCCM consult   Interim History / Subjective:  Resting comfortably on BIPAP. No change in symptoms.  Objective   Blood pressure 101/65, pulse 84, temperature 99.3 F (37.4 C), temperature source Axillary, resp. rate (!) 30, height 5' 5" (1.651 m), weight 97.8 kg, SpO2 96 %.       No intake or output data in the 24 hours ending 05/10/22 0817  Filed Weights   05/07/22 0300 05/08/22 0600  Weight: 96.8 kg 97.8 kg    Examination: No distress resting on BIPAP Lungs with crackles stable Moves ext to command Heart sounds benign  No labs this am No imaging  Resolved Hospital Problem list     Assessment & Plan:   Acute on chronic hypoxic and hypercarbic respiratory failure RSV infection  ILD PAH Chronic right sided heart failure  Severe TR SLE  -at home is 5L at rest 10L on exertion -this admission w incr O2 req, and NIV need; this  may be long term as patient is nearing end stage disease  P -cont tyvaso, diamox, demadex -Switch to PO steroid taper as outlined -mucinex -pulm hygiene, mobility, IS -wean O2 as able -- she will have incr need with exertion and will be slow to recover after exertion.  -NIV qHS and PRN.  TOC consult placed -PRN CXR -mepron ppx -plaquenil, cellcept -follows w [facility] and [facility]. Is scheduled OP w pulm 11/7.  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   Patient requires the use of NIV both qHS and daytime to help with exacerbation periods.  The use of the NIV with treat the patients high pCO2 levels and can reduce risk of exacerbations and future hospitalizations whne used at night and during the day. Patient with need these advanced settings in conjunction with current medication regimen.  BIPAP is not an option due to its functional limitations and severity of the patient's condition.  Failure to have NIV available for use over a 24 hour period could lead to death.  Patient is able to protect their airways and clear secretions on their own.  Pulmonary will be available PRN  Erskine Emery MD PCCM

## 2022-05-10 NOTE — Progress Notes (Signed)
PROGRESS NOTE Rhonda Franco  GUR:[id number] DOB: 12/05/2000 DOA: 05/07/2022 PCP: Myrtie Neither, PA-C   Brief Narrative/Hospital Course:  21 year old female with complex medical history of SLE advanced interstitial lung disease with chronic hypoxia hypercapnia respiratory failure on 5 L nasal cannula at rest/10 L with ambulation, pulmonary hypertension class II obesity BMI 35.8, recurrent history of leaving AMA admitted with working diagnosis of acute on chronic respiratory failure/RSV infection  Initially admitted 9/6-9/18: Left AMA Again admitted on 10/29 for acute on chronic respiratory failure with hypoxia oxygen/hypercapnea , tested positive for RSV>but left AMA from ED as she felt better. 10/30-6 hours later, presented with worsening shortness of breath, tachypnea with respiration 27 tachycardia 103, needing 10 L oxygen to maintain saturation 93% and readmitted on respiratory support/BiPAP, systemic steroids. cxr 10/29-diffuse bilateral interstitial thickening with left greater than right groundglass opacities no significant change from previous chest x-ray CT in September and August.     Subjective: Seen and examined this morning. She reports she was on 5l [place] at rest Bumped to 10 for bedside ambulation Overnight no fever, on BiPAP Labs improved 6 stable CMP-bicarb up at 36 Intermittent tachycardic tachypneic 20s to 30s  Assessment and Plan: Principal Problem:   Acute on chronic respiratory failure with hypercapnia (HCC) Active Problems:   ILD (interstitial lung disease) (HCC)   RSV (respiratory syncytial virus pneumonia)   PAH (pulmonary artery hypertension) (HCC)   Acute on chronic hypoxic and hypercapnic respiratory failure Acute ILD feel flareup SLE w/ advanced interstitial lung disease-5 L [place] at rest/10 L on ambulation Pulmonary hypertension Acute RSV infection: Patient appears to be overall improving clinically with current modality of treatment.Currently off  Levaquin given primary cause as viral illness triggering the exacerbation.  Appreciate pulmonary input 11/1. On Solu-Medrol 40 mg bid> transitioning to oral regimen.Continue mycophenolate,Treprostinil inhaler, atovaquone, Diamox, DuoNeb.   Continue BiPAP at bedtime and NS o2 daytime-Needing 10L at rest > wean down to 5 L as tolerated.  She may need BiPAP for home: [facility] consulted.  SLE with advanced chronic ILD with pH, followed at [facility] pulmonary clinic next appointment 11/7.  On multiple home regimen as above  Chronic right-sided heart failure: Due to chronic pulmonary condition.  Cont torsemide  Anemia suspect from chronic disease stable 10 to 11 g. GERD: Continue PPI especially while on steroid Hypokalemia: Improved Class II obesity with BMI 35, will benefit with PCP follow-up for weight loss Noncompliance /tendency to leave AMA multiple times: She has been counseled and is aware.  DVT prophylaxis: enoxaparin (LOVENOX) injection 40 mg Start: 05/07/22 1000 Code Status:   Code Status: Full Code Family Communication: plan of care discussed with patient/no family at bedside. Patient status is: Inpatient because of severe respiratory failure Level of care: Stepdown   Dispo: The patient is from: Home            Anticipated disposition: Home >3 days  Mobility Assessment (last 72 hours)     Mobility Assessment   No documentation.          Objective: Vitals last 24 hrs: Vitals:   05/10/22 0401 05/10/22 0800 05/10/22 0900 05/10/22 1111  BP:   133/78   Pulse:   (!) 102   Resp:   (!) 23   Temp:  98.6 F (37 C)  98.3 F (36.8 C)  TempSrc:  Axillary  Oral  SpO2: 96%  91%   Weight:      Height:       Weight change:  Physical Examination: General exam: AA, pleasant, weak,older appearing HEENT:Oral mucosa moist, Ear/Nose WNL grossly, dentition normal. Respiratory system: bilaterally crackles present , no use of accessory muscle Cardiovascular system: S1 & S2 +, regular  rate. Gastrointestinal system: Abdomen soft, NT,ND,BS+ Nervous System:Alert, awake, moving extremities and grossly nonfocal Extremities: LE ankle edema neg, lower extremities warm Skin: No rashes,no icterus. MSK: Normal muscle bulk,tone, power   Medications reviewed:  Scheduled Meds:  acetaZOLAMIDE  250 mg Oral BID   aspirin EC  81 mg Oral Daily   atovaquone  1,500 mg Oral q AM   Chlorhexidine Gluconate Cloth  6 each Topical Daily   enoxaparin (LOVENOX) injection  40 mg Subcutaneous Q24H   famotidine  20 mg Oral BID   fluticasone  2 spray Each Nare Daily   guaiFENesin  600 mg Oral BID   hydroxychloroquine  200 mg Oral BID   ipratropium-albuterol  3 mL Nebulization Q6H WA   loratadine  10 mg Oral Daily   mycophenolate  720 mg Oral Q12H   potassium chloride  20 mEq Oral BID   [START ON 05/11/2022] predniSONE  40 mg Oral Q breakfast   Followed by   Derrill Memo ON 05/18/2022] predniSONE  20 mg Oral Q breakfast   torsemide  20 mg Oral BID   Treprostinil  18 mcg Inhalation QID   Vitamin D (Ergocalciferol)  50,000 Units Oral Weekly   Continuous Infusions: No intake or output data in the 24 hours ending 05/10/22 1236  Net IO Since Admission: 750 mL [05/10/22 1236]  Wt Readings from Last 3 Encounters:  05/08/22 97.8 kg  03/20/22 94.9 kg  11/13/21 97.1 kg     Unresulted Labs (From admission, onward)    None     Data Reviewed: I have personally reviewed following labs and imaging studies CBC: Recent Labs  Lab 05/06/22 1340 05/07/22 0515 05/08/22 0316 05/09/22 0308  WBC 12.5* 11.7* 12.9* 10.6*  NEUTROABS  --  10.5*  --   --   HGB 11.5* 10.8* 11.0* 11.0*  HCT 40.5 37.2 38.0 37.4  MCV 87.5 86.5 86.8 84.6  PLT 368 332 377 053   Basic Metabolic Panel: Recent Labs  Lab 05/06/22 1340 05/07/22 0515 05/08/22 0316 05/09/22 0308  NA 138 134* 138 137  K 3.4* 4.4 4.1 3.8  CL 97* 97* 92* 91*  CO2 32 30 35* 36*  GLUCOSE 116* 118* 108* 114*  BUN _0 CREATININE 0.69 0.42*  0.57 0.60  CALCIUM 8.8* 9.1 9.0 8.8*  MG  --  1.9  --  2.2  PHOS  --  4.7*  --   --    GFR: Estimated Creatinine Clearance: 127.6 mL/min (by C-G formula based on SCr of 0.6 mg/dL). Liver Function Tests: Recent Labs  Lab 05/07/22 0515 05/09/22 0308  AST 24 23  ALT 13 11  ALKPHOS 54 50  BILITOT 0.3 0.5  PROT 6.7 6.5  ALBUMIN 2.8* 3.0*  Sepsis Labs: Recent Labs  Lab 05/06/22 1340  LATICACIDVEN 0.9    Recent Results (from the past 240 hour(s))  Resp Panel by RT-PCR (Flu A&B, Covid) Anterior Nasal Swab     Status: None   Collection Time: 05/06/22 10:10 AM   Specimen: Anterior Nasal Swab  Result Value Ref Range Status   SARS Coronavirus 2 by RT PCR NEGATIVE NEGATIVE Final    Comment: (NOTE) SARS-CoV-2 target nucleic acids are NOT DETECTED.  The SARS-CoV-2 RNA is generally detectable in upper respiratory specimens during the  acute phase of infection. The lowest concentration of SARS-CoV-2 viral copies this assay can detect is 138 copies/mL. A negative result does not preclude SARS-Cov-2 infection and should not be used as the sole basis for treatment or other patient management decisions. A negative result may occur with  improper specimen collection/handling, submission of specimen other than nasopharyngeal swab, presence of viral mutation(s) within the areas targeted by this assay, and inadequate number of viral copies(<138 copies/mL). A negative result must be combined with clinical observations, patient history, and epidemiological information. The expected result is Negative.  Fact Sheet for Patients:  EntrepreneurPulse.com.au  Fact Sheet for Healthcare Providers:  IncredibleEmployment.be  This test is no t yet approved or cleared by the [place] FDA and  has been authorized for detection and/or diagnosis of SARS-CoV-2 by FDA under an Emergency Use Authorization (EUA). This EUA will remain  in effect (meaning this test can  be used) for the duration of the COVID-19 declaration under Section 564(b)(1) of the Act, 21 U.S.C.section 360bbb-3(b)(1), unless the authorization is terminated  or revoked sooner.       Influenza A by PCR NEGATIVE NEGATIVE Final   Influenza B by PCR NEGATIVE NEGATIVE Final    Comment: (NOTE) The Xpert Xpress SARS-CoV-2/FLU/RSV plus assay is intended as an aid in the diagnosis of influenza from Nasopharyngeal swab specimens and should not be used as a sole basis for treatment. Nasal washings and aspirates are unacceptable for Xpert Xpress SARS-CoV-2/FLU/RSV testing.  Fact Sheet for Patients: EntrepreneurPulse.com.au  Fact Sheet for Healthcare Providers: IncredibleEmployment.be  This test is not yet approved or cleared by the [place] FDA and has been authorized for detection and/or diagnosis of SARS-CoV-2 by FDA under an Emergency Use Authorization (EUA). This EUA will remain in effect (meaning this test can be used) for the duration of the COVID-19 declaration under Section 564(b)(1) of the Act, 21 U.S.C. section 360bbb-3(b)(1), unless the authorization is terminated or revoked.  Performed at [facility], [place] [address] [place], [place] [address]   Respiratory (~20 pathogens) panel by PCR     Status: Abnormal   Collection Time: 05/06/22  6:05 PM   Specimen: Nasopharyngeal Swab; Respiratory  Result Value Ref Range Status   Adenovirus NOT DETECTED NOT DETECTED Final   Coronavirus 229E NOT DETECTED NOT DETECTED Final    Comment: (NOTE) The Coronavirus on the Respiratory Panel, DOES NOT test for the novel  Coronavirus (2019 nCoV)    Coronavirus HKU1 NOT DETECTED NOT DETECTED Final   Coronavirus NL63 NOT DETECTED NOT DETECTED Final   Coronavirus OC43 NOT DETECTED NOT DETECTED Final   Metapneumovirus NOT DETECTED NOT DETECTED Final   Rhinovirus / Enterovirus NOT DETECTED NOT DETECTED Final   Influenza A NOT  DETECTED NOT DETECTED Final   Influenza B NOT DETECTED NOT DETECTED Final   Parainfluenza Virus 1 NOT DETECTED NOT DETECTED Final   Parainfluenza Virus 2 NOT DETECTED NOT DETECTED Final   Parainfluenza Virus 3 NOT DETECTED NOT DETECTED Final   Parainfluenza Virus 4 NOT DETECTED NOT DETECTED Final   Respiratory Syncytial Virus DETECTED (A) NOT DETECTED Final   Bordetella pertussis NOT DETECTED NOT DETECTED Final   Bordetella Parapertussis NOT DETECTED NOT DETECTED Final   Chlamydophila pneumoniae NOT DETECTED NOT DETECTED Final   Mycoplasma pneumoniae NOT DETECTED NOT DETECTED Final    Comment: Performed at [facility], [place]. [address], [place], [place] [address]  MRSA Next Gen by PCR, Nasal     Status:  None   Collection Time: 05/07/22  2:59 AM   Specimen: Nasal Mucosa; Nasal Swab  Result Value Ref Range Status   MRSA by PCR Next Gen NOT DETECTED NOT DETECTED Final    Comment: (NOTE) The GeneXpert MRSA Assay (FDA approved for NASAL specimens only), is one component of a comprehensive MRSA colonization surveillance program. It is not intended to diagnose MRSA infection nor to guide or monitor treatment for MRSA infections. Test performance is not FDA approved in patients less than 55 years old. Performed at [facility], [place] [address], [place], [place] [address]     Antimicrobials: Anti-infectives (From admission, onward)    Start     Dose/Rate Route Frequency Ordered Stop   05/07/22 0700  atovaquone (MEPRON) 750 MG/5ML suspension 1,500 mg        1,500 mg Oral Every morning 05/07/22 0313     05/07/22 0430  hydroxychloroquine (PLAQUENIL) tablet 200 mg        200 mg Oral 2 times daily 05/07/22 0330     05/07/22 0415  levofloxacin (LEVAQUIN) IVPB 750 mg  Status:  Discontinued        750 mg 100 mL/hr over 90 Minutes Intravenous Every 24 hours 05/07/22 0329 05/07/22 1148      Culture/Microbiology    Component Value Date/Time   SDES THROAT 03/05/2017 0914    SPECREQUEST NONE Reflexed from [id number] 03/05/2017 0914   CULT NO GROUP A STREP (S.PYOGENES) ISOLATED 03/05/2017 0914   REPTSTATUS 03/07/2017 FINAL 03/05/2017 0914    Radiology Studies: No results found.   LOS: 3 days   Antonieta Pert, MD [facility]  05/10/2022, 12:36 PM

## 2022-05-11 DIAGNOSIS — J9622 Acute and chronic respiratory failure with hypercapnia: Secondary | ICD-10-CM | POA: Diagnosis not present

## 2022-05-11 LAB — BLOOD GAS, ARTERIAL
Acid-Base Excess: 13.7 mmol/L — ABNORMAL HIGH (ref 0.0–2.0)
Bicarbonate: 39.3 mmol/L — ABNORMAL HIGH (ref 20.0–28.0)
Drawn by: 42246
O2 Saturation: 100 %
Patient temperature: 37
pCO2 arterial: 54 mmHg — ABNORMAL HIGH (ref 32–48)
pH, Arterial: 7.47 — ABNORMAL HIGH (ref 7.35–7.45)
pO2, Arterial: 99 mmHg (ref 83–108)

## 2022-05-11 MED ORDER — GUAIFENESIN ER 600 MG PO TB12: 600.0000 mg | ORAL_TABLET | Freq: Two times a day (BID) | ORAL | 0 refills | Status: AC

## 2022-05-11 MED ORDER — PREDNISONE 20 MG PO TABS: ORAL_TABLET | ORAL | 0 refills | Status: AC

## 2022-05-11 NOTE — TOC Progression Note (Signed)
Transition of Care [facility]) - Progression Note    Patient Details  Name: Landyn Lorincz MRN: [id number] Date of Birth: 09-12-2000  Transition of Care [facility]) CM/SW Contact  Joaquin Courts, RN Phone Number: 05/11/2022, 6:14 PM  Clinical Narrative:   CM  spoke with [employer] respiratory therapist Lilia Pro, who reports that the auth for trilogy is still pending, however [employer] is able to set up the Bipap STA for home use and continue to work towards [place] approval in the outpatient setting, with plans to swap out the bipap for trilogy once auth approved.  MD updated of this information, and indicates patient will discharge home today.  Lilia Pro with [employer] updated of this information and is prepared to meet patient in her home today for device set-up.     Expected Discharge Plan: Home/Self Care Barriers to Discharge: No Barriers Identified  Expected Discharge Plan and Services Expected Discharge Plan: Home/Self Care     Post Acute Care Choice: Durable Medical Equipment Living arrangements for the past 2 months: Single Family Home                   DME Agency: [employer]                   Social Determinants of Health (SDOH) Interventions    Readmission Risk Interventions     No data to display

## 2022-05-11 NOTE — Discharge Summary (Signed)
Physician Discharge Summary  Rhonda Franco WJX:[id number] DOB: [birth year] DOA: 05/07/2022  PCP: Myrtie Neither, PA-C  Admit date: 05/07/2022 Discharge date: 05/11/2022 Recommendations for Outpatient Follow-up:  Follow up with PCP in 1 weeks-call for appointment Please obtain BMP/CBC in one week  Discharge Dispo: *** Discharge Condition: Stable Code Status:   Code Status: Full Code Diet recommendation:  Diet Order             Diet regular Room service appropriate? Yes; Fluid consistency: Thin  Diet effective now                    Brief/Interim Summary:  21 year old female with complex medical history of SLE advanced interstitial lung disease with chronic hypoxia hypercapnia respiratory failure on 5 L nasal cannula at rest/10 L with ambulation, pulmonary hypertension class II obesity BMI 35.8, recurrent history of leaving AMA admitted with working diagnosis of acute on chronic respiratory failure/RSV infection  Initially admitted 9/6-9/18: Left AMA Again admitted on 10/29 for acute on chronic respiratory failure with hypoxia oxygen/hypercapnea , tested positive for RSV>but left AMA from ED as she felt better. 10/30-6 hours later, presented with worsening shortness of breath, tachypnea with respiration 27 tachycardia 103, needing 10 L oxygen to maintain saturation 93% and readmitted on respiratory support/BiPAP, systemic steroids. cxr 10/29-diffuse bilateral interstitial thickening with left greater than right groundglass opacities no significant change from previous chest x-ray CT in September and August. Seen by pulmonary at this time respiratory status overall stability 2 L embolism, far better at rest. She will continue oral steroid taper, home BiPAP which is being arranged by [facility] signed off and is stable for discharge once BiPAP is arranged  Of note she has had chronic tachypnea and tachycardia I offered her to keep one more day sine this is evening but  she requesting for discharge home. BIPAP is being set up tonight with [facility].  Discharge Diagnoses:  Principal Problem:   Acute on chronic respiratory failure with hypercapnia (HCC) Active Problems:   ILD (interstitial lung disease) (HCC)   RSV (respiratory syncytial virus pneumonia)   PAH (pulmonary artery hypertension) (HCC)  Acute on chronic hypoxic and hypercapnic respiratory failure Acute ILD feel flareup SLE w/ advanced interstitial lung disease-5 L  at rest/10 L on ambulation Pulmonary hypertension Acute RSV infection: Acute on chronic respiratory failure in the setting of acute RSV infection overall clinically improving.  Appreciate pulmonary input.  Continue on prednisone taper, along with home regimen mycophenolate,Treprostinil inhaler, atovaquone, Diamox, DuoNeb.  Continue BiPAP bedtime> arranging home BiPAP, order signed.Cont supplemental oxygen needing 10L on ambulation 5 L at rest.     SLE with advanced chronic ILD with pH, followed at [facility] next appointment 11/7.  On multiple home regimen as above   Chronic right-sided heart failure: Stable on home torsemide.     Anemia suspect from chronic disease stable 10 to 11 g.  Monitor intermittently GERD: Continue PPI especially while on steroid Hypokalemia: Improved Class II obesity with BMI 35, will benefit with PCP follow-up for weight loss Noncompliance /tendency to leave AMA multiple times: She has been counseled and is aware.   Consults: *** Subjective: ***  Discharge Exam: Vitals:   05/11/22 1600 05/11/22 1800  BP:    Pulse: (!) 122 (!) 115  Resp: (!) 26 (!) 26  Temp: 98.8 F (37.1 C)   SpO2: 93% 95%   General: Pt is alert, awake, not in acute distress Cardiovascular: RRR, S1/S2 +,  no rubs, no gallops Respiratory: CTA bilaterally, no wheezing, no rhonchi Abdominal: Soft, NT, ND, bowel sounds + Extremities: no edema, no cyanosis  Discharge Instructions  Discharge Instructions      Discharge instructions   Complete by: As directed    Please contact pulmonary team at [facility] and follow-up as soon as possible within 5 to 7 days, follow-up with your primary care doctor in a week.  Please use the bipap at home please call Lilia Pro with [facility] [phone], she is on stand by to set up the bipap tonight   If you have any difficulty with breathing or needing more than usual oxygenation, please call 911 or return to ED.  Please call call MD or return to ER for similar or worsening recurring problem that brought you to hospital or if any fever,nausea/vomiting,abdominal pain, uncontrolled pain, chest pain,  shortness of breath or any other alarming symptoms.  Please follow-up your doctor as instructed in a week time and call the office for appointment.  Please avoid alcohol, smoking, or any other illicit substance and maintain healthy habits including taking your regular medications as prescribed.  You were cared for by a hospitalist during your hospital stay. If you have any questions about your discharge medications or the care you received while you were in the hospital after you are discharged, you can call the unit and ask to speak with the hospitalist on call if the hospitalist that took care of you is not available.  Once you are discharged, your primary care physician will handle any further medical issues. Please note that NO REFILLS for any discharge medications will be authorized once you are discharged, as it is imperative that you return to your primary care physician (or establish a relationship with a primary care physician if you do not have one) for your aftercare needs so that they can reassess your need for medications and monitor your lab values   Increase activity slowly   Complete by: As directed       Allergies as of 05/11/2022       Reactions   Ampicillin Anaphylaxis, Swelling, Palpitations, Rash   Keflex [cephalexin] Anaphylaxis, Swelling, Other (See  Comments)   Throat swelling   Penicillins Anaphylaxis, Swelling, Palpitations, Rash   Has patient had a PCN reaction causing immediate rash, facial/tongue/throat swelling, SOB or lightheadedness with hypotension: Yes Has patient had a PCN reaction causing severe rash involving mucus membranes or skin necrosis: Yes Has patient had a PCN reaction that required hospitalization: Yes Has patient had a PCN reaction occurring within the last 10 years: Yes If all of the above answers are "NO", then may proceed with Cephalosporin use.   Bactrim [sulfamethoxazole-trimethoprim] Nausea Only, Rash   Levaquin [levofloxacin] Itching   Vaginal itching        Medication List     TAKE these medications    acetaZOLAMIDE 250 MG tablet Commonly known as: DIAMOX Take 250 mg by mouth 2 (two) times daily.   aspirin EC 81 MG tablet Take 81 mg by mouth daily.   atovaquone 750 MG/5ML suspension Commonly known as: MEPRON Take 10 mLs by mouth in the morning.   ergocalciferol 1.25 MG (50000 UT) capsule Commonly known as: VITAMIN D2 Take 50,000 Units by mouth once a week.   famotidine 20 MG tablet Commonly known as: PEPCID Take 20 mg by mouth 2 (two) times daily.   guaiFENesin 600 MG 12 hr tablet Commonly known as: MUCINEX Take 1 tablet (600 mg total) by  mouth 2 (two) times daily for 7 days.   hydroxychloroquine 200 MG tablet Commonly known as: PLAQUENIL Take 1 tablet (200 mg total) by mouth 2 (two) times daily.   mycophenolate 360 MG Tbec EC tablet Commonly known as: MYFORTIC Take 720 mg by mouth every 12 (twelve) hours.   potassium chloride SA 20 MEQ tablet Commonly known as: KLOR-CON M Take 20 mEq by mouth 3 (three) times daily.   predniSONE 20 MG tablet Commonly known as: DELTASONE Take 2 tabs until 05/18/22 then 1 tab daily x 7 days Start taking on: May 12, 2022   torsemide 20 MG tablet Commonly known as: DEMADEX Take 20 mg by mouth 2 (two) times daily.   TYVASO IN Inhale  12 puffs into the lungs 4 (four) times daily.               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   Patient requires the use of NIV both qHS and daytime to help with exacerbation periods.  The use of the NIV with treat the patients high pCO2 levels and can reduce risk of exacerbations and future hospitalizations whne used at night and during the day. Patient with need these advanced settings in conjunction with current medication regimen.  BIPAP is not an option due to its functional limitations and severity of the patient's condition.  Failure to have NIV available for use over a 24 hour period could lead to death.  Patient is able to protect their airways and clear secretions on their own.  Question Answer Comment  Length of Need Lifetime   Keep 02 saturation 90      05/10/22 1239            Follow-up Information     Myrtie Neither, PA-C Follow up in 1 week(s).   Specialty: Physician Assistant Contact information: [place] [phone]         [facility] pulmonary Team Follow up in 3 day(s).                 Allergies  Allergen Reactions   Ampicillin Anaphylaxis, Swelling, Palpitations and Rash   Keflex [Cephalexin] Anaphylaxis, Swelling and Other (See Comments)    Throat swelling   Penicillins Anaphylaxis, Swelling, Palpitations and Rash    Has patient had a PCN reaction causing immediate rash, facial/tongue/throat swelling, SOB or lightheadedness with hypotension: Yes Has patient had a PCN reaction causing severe rash involving mucus membranes or skin necrosis: Yes Has patient had a PCN reaction that required hospitalization: Yes Has patient had  a PCN reaction occurring within the last 10 years: Yes If all of the above answers are "NO", then may proceed with Cephalosporin use.    Bactrim [Sulfamethoxazole-Trimethoprim] Nausea Only and Rash   Levaquin [Levofloxacin] Itching    Vaginal itching    The results of significant diagnostics from this hospitalization (including imaging, microbiology, ancillary and laboratory) are listed below for reference.    Microbiology: Recent Results (from the past 240 hour(s))  Resp Panel by RT-PCR (Flu A&B, Covid) Anterior Nasal Swab     Status: None   Collection Time:  05/06/22 10:10 AM   Specimen: Anterior Nasal Swab  Result Value Ref Range Status   SARS Coronavirus 2 by RT PCR NEGATIVE NEGATIVE Final    Comment: (NOTE) SARS-CoV-2 target nucleic acids are NOT DETECTED.  The SARS-CoV-2 RNA is generally detectable in upper respiratory specimens during the acute phase of infection. The lowest concentration of SARS-CoV-2 viral copies this assay can detect is 138 copies/mL. A negative result does not preclude SARS-Cov-2 infection and should not be used as the sole basis for treatment or other patient management decisions. A negative result may occur with  improper specimen collection/handling, submission of specimen other than nasopharyngeal swab, presence of viral mutation(s) within the areas targeted by this assay, and inadequate number of viral copies(<138 copies/mL). A negative result must be combined with clinical observations, patient history, and epidemiological information. The expected result is Negative.  Fact Sheet for Patients:  EntrepreneurPulse.com.au  Fact Sheet for Healthcare Providers:  IncredibleEmployment.be  This test is no t yet approved or cleared by the [place] FDA and  has been authorized for detection and/or diagnosis of SARS-CoV-2 by FDA under an Emergency Use Authorization (EUA). This EUA will remain  in effect (meaning  this test can be used) for the duration of the COVID-19 declaration under Section 564(b)(1) of the Act, 21 U.S.C.section 360bbb-3(b)(1), unless the authorization is terminated  or revoked sooner.       Influenza A by PCR NEGATIVE NEGATIVE Final   Influenza B by PCR NEGATIVE NEGATIVE Final    Comment: (NOTE) The Xpert Xpress SARS-CoV-2/FLU/RSV plus assay is intended as an aid in the diagnosis of influenza from Nasopharyngeal swab specimens and should not be used as a sole basis for treatment. Nasal washings and aspirates are unacceptable for Xpert Xpress SARS-CoV-2/FLU/RSV testing.  Fact Sheet for Patients: EntrepreneurPulse.com.au  Fact Sheet for Healthcare Providers: IncredibleEmployment.be  This test is not yet approved or cleared by the [place] FDA and has been authorized for detection and/or diagnosis of SARS-CoV-2 by FDA under an Emergency Use Authorization (EUA). This EUA will remain in effect (meaning this test can be used) for the duration of the COVID-19 declaration under Section 564(b)(1) of the Act, 21 U.S.C. section 360bbb-3(b)(1), unless the authorization is terminated or revoked.  Performed at [facility], [place] [address] [place], [place] [address]   Respiratory (~20 pathogens) panel by PCR     Status: Abnormal   Collection Time: 05/06/22  6:05 PM   Specimen: Nasopharyngeal Swab; Respiratory  Result Value Ref Range Status   Adenovirus NOT DETECTED NOT DETECTED Final   Coronavirus 229E NOT DETECTED NOT DETECTED Final    Comment: (NOTE) The Coronavirus on the Respiratory Panel, DOES NOT test for the novel  Coronavirus (2019 nCoV)    Coronavirus HKU1 NOT DETECTED NOT DETECTED Final   Coronavirus NL63 NOT DETECTED NOT DETECTED Final   Coronavirus OC43 NOT DETECTED NOT DETECTED Final   Metapneumovirus NOT DETECTED NOT DETECTED Final   Rhinovirus / Enterovirus NOT DETECTED NOT DETECTED Final    Influenza A NOT DETECTED NOT DETECTED Final   Influenza B NOT DETECTED NOT DETECTED Final   Parainfluenza Virus 1 NOT DETECTED NOT DETECTED Final   Parainfluenza Virus 2 NOT DETECTED NOT DETECTED Final   Parainfluenza Virus 3 NOT DETECTED NOT DETECTED Final   Parainfluenza Virus 4 NOT DETECTED NOT DETECTED Final   Respiratory Syncytial Virus DETECTED (A) NOT DETECTED Final   Bordetella pertussis NOT DETECTED NOT DETECTED Final   Bordetella Parapertussis NOT  DETECTED NOT DETECTED Final   Chlamydophila pneumoniae NOT DETECTED NOT DETECTED Final   Mycoplasma pneumoniae NOT DETECTED NOT DETECTED Final    Comment: Performed at [facility], [place] [address], [place], [place] [address]  MRSA Next Gen by PCR, Nasal     Status: None   Collection Time: 05/07/22  2:59 AM   Specimen: Nasal Mucosa; Nasal Swab  Result Value Ref Range Status   MRSA by PCR Next Gen NOT DETECTED NOT DETECTED Final    Comment: (NOTE) The GeneXpert MRSA Assay (FDA approved for NASAL specimens only), is one component of a comprehensive MRSA colonization surveillance program. It is not intended to diagnose MRSA infection nor to guide or monitor treatment for MRSA infections. Test performance is not FDA approved in patients less than 49 years old. Performed at [facility], [place] [address], [place], [place] [address]     Procedures/Studies: DG Chest 2 View  Result Date: 05/06/2022 CLINICAL DATA:  Productive cough. Short of breath and weakness. History of lupus and hypertension. EXAM: CHEST - 2 VIEW COMPARISON:  03/18/2022.  CT, 02/18/2022. FINDINGS: Cardiac silhouette is normal in size and configuration. No mediastinal or hilar masses.  No evidence of adenopathy. Bilateral interstitial thickening. Hazy intervening ground-glass opacities are noted, most apparent in the left mid to upper lung. These findings are stable from the prior chest radiographs. No pleural effusion or pneumothorax. Skeletal  structures are intact. IMPRESSION: 1. No significant change from the prior chest radiographs. Diffuse bilateral interstitial thickening with left greater than right ground-glass opacities. Given the stability, this is all suspected to be due to interstitial lung disease. Acute superimposed infection is not excluded. Electronically Signed   By: Lajean Manes M.D.   On: 05/06/2022 10:49    Labs: BNP (last 3 results) Recent Labs    03/16/22 1402 03/17/22 0515 05/06/22 1340  BNP 61.1 42.7 93.7   Basic Metabolic Panel: Recent Labs  Lab 05/06/22 1340 05/07/22 0515 05/08/22 0316 05/09/22 0308  NA 138 134* 138 137  K 3.4* 4.4 4.1 3.8  CL 97* 97* 92* 91*  CO2 32 30 35* 36*  GLUCOSE 116* 118* 108* 114*  BUN _0 CREATININE 0.69 0.42* 0.57 0.60  CALCIUM 8.8* 9.1 9.0 8.8*  MG  --  1.9  --  2.2  PHOS  --  4.7*  --   --    Liver Function Tests: Recent Labs  Lab 05/07/22 0515 05/09/22 0308  AST 24 23  ALT 13 11  ALKPHOS 54 50  BILITOT 0.3 0.5  PROT 6.7 6.5  ALBUMIN 2.8* 3.0*   No results for input(s): "LIPASE", "AMYLASE" in the last 168 hours. No results for input(s): "AMMONIA" in the last 168 hours. CBC: Recent Labs  Lab 05/06/22 1340 05/07/22 0515 05/08/22 0316 05/09/22 0308  WBC 12.5* 11.7* 12.9* 10.6*  NEUTROABS  --  10.5*  --   --   HGB 11.5* 10.8* 11.0* 11.0*  HCT 40.5 37.2 38.0 37.4  MCV 87.5 86.5 86.8 84.6  PLT 368 332 377 306   Cardiac Enzymes: No results for input(s): "CKTOTAL", "CKMB", "CKMBINDEX", "TROPONINI" in the last 168 hours. BNP: Invalid input(s): "POCBNP" CBG: No results for input(s): "GLUCAP" in the last 168 hours. D-Dimer No results for input(s): "DDIMER" in the last 72 hours. Hgb A1c No results for input(s): "HGBA1C" in the last 72 hours. Lipid Profile No results for input(s): "CHOL", "HDL", "LDLCALC", "TRIG", "CHOLHDL", "LDLDIRECT" in the last 72  hours. Thyroid function studies No results for input(s): "TSH", "T4TOTAL", "T3FREE",  "THYROIDAB" in the last 72 hours.  Invalid input(s): "FREET3" Anemia work up No results for input(s): "VITAMINB12", "FOLATE", "FERRITIN", "TIBC", "IRON", "RETICCTPCT" in the last 72 hours. Urinalysis    Component Value Date/Time   COLORURINE YELLOW 10/16/2021 1441   APPEARANCEUR CLEAR 10/16/2021 1441   LABSPEC >=1.030 10/16/2021 1441   PHURINE 6.0 10/16/2021 1441   GLUCOSEU NEGATIVE 10/16/2021 1441   HGBUR NEGATIVE 10/16/2021 1441   BILIRUBINUR NEGATIVE 10/16/2021 1441   BILIRUBINUR small (A) 06/13/2018 0916   KETONESUR NEGATIVE 10/16/2021 1441   PROTEINUR 100 (A) 10/16/2021 1441   UROBILINOGEN 0.2 06/13/2018 0916   NITRITE NEGATIVE 10/16/2021 1441   LEUKOCYTESUR NEGATIVE 10/16/2021 1441   Sepsis Labs Recent Labs  Lab 05/06/22 1340 05/07/22 0515 05/08/22 0316 05/09/22 0308  WBC 12.5* 11.7* 12.9* 10.6*   Microbiology Recent Results (from the past 240 hour(s))  Resp Panel by RT-PCR (Flu A&B, Covid) Anterior Nasal Swab     Status: None   Collection Time: 05/06/22 10:10 AM   Specimen: Anterior Nasal Swab  Result Value Ref Range Status   SARS Coronavirus 2 by RT PCR NEGATIVE NEGATIVE Final    Comment: (NOTE) SARS-CoV-2 target nucleic acids are NOT DETECTED.  The SARS-CoV-2 RNA is generally detectable in upper respiratory specimens during the acute phase of infection. The lowest concentration of SARS-CoV-2 viral copies this assay can detect is 138 copies/mL. A negative result does not preclude SARS-Cov-2 infection and should not be used as the sole basis for treatment or other patient management decisions. A negative result may occur with  improper specimen collection/handling, submission of specimen other than nasopharyngeal swab, presence of viral mutation(s) within the areas targeted by this assay, and inadequate number of viral copies(<138 copies/mL). A negative result must be combined with clinical observations, patient history, and epidemiological information. The  expected result is Negative.  Fact Sheet for Patients:  EntrepreneurPulse.com.au  Fact Sheet for Healthcare Providers:  IncredibleEmployment.be  This test is no t yet approved or cleared by the [place] FDA and  has been authorized for detection and/or diagnosis of SARS-CoV-2 by FDA under an Emergency Use Authorization (EUA). This EUA will remain  in effect (meaning this test can be used) for the duration of the COVID-19 declaration under Section 564(b)(1) of the Act, 21 U.S.C.section 360bbb-3(b)(1), unless the authorization is terminated  or revoked sooner.       Influenza A by PCR NEGATIVE NEGATIVE Final   Influenza B by PCR NEGATIVE NEGATIVE Final    Comment: (NOTE) The Xpert Xpress SARS-CoV-2/FLU/RSV plus assay is intended as an aid in the diagnosis of influenza from Nasopharyngeal swab specimens and should not be used as a sole basis for treatment. Nasal washings and aspirates are unacceptable for Xpert Xpress SARS-CoV-2/FLU/RSV testing.  Fact Sheet for Patients: EntrepreneurPulse.com.au  Fact Sheet for Healthcare Providers: IncredibleEmployment.be  This test is not yet approved or cleared by the [place] FDA and has been authorized for detection and/or diagnosis of SARS-CoV-2 by FDA under an Emergency Use Authorization (EUA). This EUA will remain in effect (meaning this test can be used) for the duration of the COVID-19 declaration under Section 564(b)(1) of the Act, 21 U.S.C. section 360bbb-3(b)(1), unless the authorization is terminated or revoked.  Performed at [facility], [place] [address] [place], [place] [address]   Respiratory (~20 pathogens) panel by PCR     Status: Abnormal   Collection Time: 05/06/22  6:05  PM   Specimen: Nasopharyngeal Swab; Respiratory  Result Value Ref Range Status   Adenovirus NOT DETECTED NOT DETECTED Final   Coronavirus 229E NOT  DETECTED NOT DETECTED Final    Comment: (NOTE) The Coronavirus on the Respiratory Panel, DOES NOT test for the novel  Coronavirus (2019 nCoV)    Coronavirus HKU1 NOT DETECTED NOT DETECTED Final   Coronavirus NL63 NOT DETECTED NOT DETECTED Final   Coronavirus OC43 NOT DETECTED NOT DETECTED Final   Metapneumovirus NOT DETECTED NOT DETECTED Final   Rhinovirus / Enterovirus NOT DETECTED NOT DETECTED Final   Influenza A NOT DETECTED NOT DETECTED Final   Influenza B NOT DETECTED NOT DETECTED Final   Parainfluenza Virus 1 NOT DETECTED NOT DETECTED Final   Parainfluenza Virus 2 NOT DETECTED NOT DETECTED Final   Parainfluenza Virus 3 NOT DETECTED NOT DETECTED Final   Parainfluenza Virus 4 NOT DETECTED NOT DETECTED Final   Respiratory Syncytial Virus DETECTED (A) NOT DETECTED Final   Bordetella pertussis NOT DETECTED NOT DETECTED Final   Bordetella Parapertussis NOT DETECTED NOT DETECTED Final   Chlamydophila pneumoniae NOT DETECTED NOT DETECTED Final   Mycoplasma pneumoniae NOT DETECTED NOT DETECTED Final    Comment: Performed at [facility], [place]. [address], [place], [place] [address]  MRSA Next Gen by PCR, Nasal     Status: None   Collection Time: 05/07/22  2:59 AM   Specimen: Nasal Mucosa; Nasal Swab  Result Value Ref Range Status   MRSA by PCR Next Gen NOT DETECTED NOT DETECTED Final    Comment: (NOTE) The GeneXpert MRSA Assay (FDA approved for NASAL specimens only), is one component of a comprehensive MRSA colonization surveillance program. It is not intended to diagnose MRSA infection nor to guide or monitor treatment for MRSA infections. Test performance is not FDA approved in patients less than 39 years old. Performed at [facility], [place] [address], [place], [place] [address]      Time coordinating discharge: 25 minutes  SIGNED: Antonieta Pert, MD  [facility] 05/11/2022, 6:21 PM  If 7PM-7AM, please contact night-coverage [email or web address]

## 2022-05-11 NOTE — Progress Notes (Signed)
Discharge instructions provided to and discussed with patient. All questions answered. Pt instructed to contact [place], RT with [facility] who is on standby to come to her home tonight. Pt's mother arrived with pt's home oxygen. IV removed. Pt taken to entrance for discharge at 1905.

## 2022-05-11 NOTE — Progress Notes (Signed)
PROGRESS NOTE Rhonda Franco  MMH:[id number] DOB: [birth year] DOA: 05/07/2022 PCP: Myrtie Neither, PA-C   Brief Narrative/Hospital Course:  21 year old female with complex medical history of SLE advanced interstitial lung disease with chronic hypoxia hypercapnia respiratory failure on 5 L nasal cannula at rest/10 L with ambulation, pulmonary hypertension class II obesity BMI 35.8, recurrent history of leaving AMA admitted with working diagnosis of acute on chronic respiratory failure/RSV infection  Initially admitted 9/6-9/18: Left AMA Again admitted on 10/29 for acute on chronic respiratory failure with hypoxia oxygen/hypercapnea , tested positive for RSV>but left AMA from ED as she felt better. 10/30-6 hours later, presented with worsening shortness of breath, tachypnea with respiration 27 tachycardia 103, needing 10 L oxygen to maintain saturation 93% and readmitted on respiratory support/BiPAP, systemic steroids. cxr 10/29-diffuse bilateral interstitial thickening with left greater than right groundglass opacities no significant change from previous chest x-ray CT in September and August.     Subjective: Seen and examined this morning on 8 L nasal cannula on bedside, able to speak in full sentence, no new complaints.   Used BiPAP overnight. Assessment and Plan: Principal Problem:   Acute on chronic respiratory failure with hypercapnia (HCC) Active Problems:   ILD (interstitial lung disease) (HCC)   RSV (respiratory syncytial virus pneumonia)   PAH (pulmonary artery hypertension) (HCC)   Acute on chronic hypoxic and hypercapnic respiratory failure Acute ILD feel flareup SLE w/ advanced interstitial lung disease-5 L [place] at rest/10 L on ambulation Pulmonary hypertension Acute RSV infection: Acute on chronic respiratory failure in the setting of acute RSV infection overall clinically improving.  Appreciate pulmonary input.  Continue on prednisone taper, along with home regimen  mycophenolate,Treprostinil inhaler, atovaquone, Diamox, DuoNeb.  Continue BiPAP bedtime> arranging home BiPAP, order signed.Cont supplemental oxygen needing 10L on ambulation 5 L at rest.    SLE with advanced chronic ILD with pH, followed at [facility] pulmonary clinic next appointment 11/7.  On multiple home regimen as above  Chronic right-sided heart failure: Stable on home torsemide.    Anemia suspect from chronic disease stable 10 to 11 g.  Monitor intermittently GERD: Continue PPI especially while on steroid Hypokalemia: Improved Class II obesity with BMI 35, will benefit with PCP follow-up for weight loss Noncompliance /tendency to leave AMA multiple times: She has been counseled and is aware.  DVT prophylaxis: enoxaparin (LOVENOX) injection 40 mg Start: 05/07/22 1000 Code Status:   Code Status: Full Code Family Communication: plan of care discussed with patient/no family at bedside. Patient status is: Inpatient because of severe respiratory failure Level of care: Stepdown   Dispo: The patient is from: Home            Anticipated disposition: Home once BiPAP is arranged.  Discussed with pulmonary,   Mobility Assessment (last 72 hours)     Mobility Assessment   No documentation.          Objective: Vitals last 24 hrs: Vitals:   05/11/22 0455 05/11/22 0800 05/11/22 0839 05/11/22 0848  BP:  131/82    Pulse:  (!) 120    Resp:  (!) 22    Temp: 98.5 F (36.9 C)   98.1 F (36.7 C)  TempSrc: Axillary   Oral  SpO2:  95% (!) 88%   Weight:      Height:       Weight change:   Physical Examination: General exam: AA OX3, not in distress, weak,older appearing HEENT:Oral mucosa moist, Ear/Nose WNL grossly, dentition normal. Respiratory system: bilaterally crackles  present ,no use of accessory muscle Cardiovascular system: S1 & S2 +, regular rate. Gastrointestinal system: Abdomen soft, NT,ND,BS+ Nervous System:Alert, awake, moving extremities and grossly nonfocal Extremities: LE  ankle edema neg, lower extremities warm Skin: No rashes,no icterus. MSK: Normal muscle bulk,tone, power   Medications reviewed:  Scheduled Meds:  acetaZOLAMIDE  250 mg Oral BID   aspirin EC  81 mg Oral Daily   atovaquone  1,500 mg Oral q AM   Chlorhexidine Gluconate Cloth  6 each Topical Daily   enoxaparin (LOVENOX) injection  40 mg Subcutaneous Q24H   famotidine  20 mg Oral BID   fluticasone  2 spray Each Nare Daily   guaiFENesin  600 mg Oral BID   hydroxychloroquine  200 mg Oral BID   ipratropium-albuterol  3 mL Nebulization TID   loratadine  10 mg Oral Daily   mycophenolate  720 mg Oral Q12H   potassium chloride  20 mEq Oral BID   predniSONE  40 mg Oral Q breakfast   Followed by   Derrill Memo ON 05/18/2022] predniSONE  20 mg Oral Q breakfast   torsemide  20 mg Oral BID   Treprostinil  18 mcg Inhalation QID   Vitamin D (Ergocalciferol)  50,000 Units Oral Weekly   Continuous Infusions: No intake or output data in the 24 hours ending 05/11/22 1354  Net IO Since Admission: 750 mL [05/11/22 1354]  Wt Readings from Last 3 Encounters:  05/08/22 97.8 kg  03/20/22 94.9 kg  11/13/21 97.1 kg     Unresulted Labs (From admission, onward)    None     Data Reviewed: I have personally reviewed following labs and imaging studies CBC: Recent Labs  Lab 05/06/22 1340 05/07/22 0515 05/08/22 0316 05/09/22 0308  WBC 12.5* 11.7* 12.9* 10.6*  NEUTROABS  --  10.5*  --   --   HGB 11.5* 10.8* 11.0* 11.0*  HCT 40.5 37.2 38.0 37.4  MCV 87.5 86.5 86.8 84.6  PLT 368 332 377 161    Basic Metabolic Panel: Recent Labs  Lab 05/06/22 1340 05/07/22 0515 05/08/22 0316 05/09/22 0308  NA 138 134* 138 137  K 3.4* 4.4 4.1 3.8  CL 97* 97* 92* 91*  CO2 32 30 35* 36*  GLUCOSE 116* 118* 108* 114*  BUN _0 CREATININE 0.69 0.42* 0.57 0.60  CALCIUM 8.8* 9.1 9.0 8.8*  MG  --  1.9  --  2.2  PHOS  --  4.7*  --   --     GFR: Estimated Creatinine Clearance: 127.6 mL/min (by C-G formula  based on SCr of 0.6 mg/dL). Liver Function Tests: Recent Labs  Lab 05/07/22 0515 05/09/22 0308  AST 24 23  ALT 13 11  ALKPHOS 54 50  BILITOT 0.3 0.5  PROT 6.7 6.5  ALBUMIN 2.8* 3.0*   Sepsis Labs: Recent Labs  Lab 05/06/22 1340  LATICACIDVEN 0.9     Recent Results (from the past 240 hour(s))  Resp Panel by RT-PCR (Flu A&B, Covid) Anterior Nasal Swab     Status: None   Collection Time: 05/06/22 10:10 AM   Specimen: Anterior Nasal Swab  Result Value Ref Range Status   SARS Coronavirus 2 by RT PCR NEGATIVE NEGATIVE Final    Comment: (NOTE) SARS-CoV-2 target nucleic acids are NOT DETECTED.  The SARS-CoV-2 RNA is generally detectable in upper respiratory specimens during the acute phase of infection. The lowest concentration of SARS-CoV-2 viral copies this assay can detect is 138 copies/mL. A negative result  does not preclude SARS-Cov-2 infection and should not be used as the sole basis for treatment or other patient management decisions. A negative result may occur with  improper specimen collection/handling, submission of specimen other than nasopharyngeal swab, presence of viral mutation(s) within the areas targeted by this assay, and inadequate number of viral copies(<138 copies/mL). A negative result must be combined with clinical observations, patient history, and epidemiological information. The expected result is Negative.  Fact Sheet for Patients:  EntrepreneurPulse.com.au  Fact Sheet for Healthcare Providers:  IncredibleEmployment.be  This test is no t yet approved or cleared by the [place] FDA and  has been authorized for detection and/or diagnosis of SARS-CoV-2 by FDA under an Emergency Use Authorization (EUA). This EUA will remain  in effect (meaning this test can be used) for the duration of the COVID-19 declaration under Section 564(b)(1) of the Act, 21 U.S.C.section 360bbb-3(b)(1), unless the authorization is  terminated  or revoked sooner.       Influenza A by PCR NEGATIVE NEGATIVE Final   Influenza B by PCR NEGATIVE NEGATIVE Final    Comment: (NOTE) The Xpert Xpress SARS-CoV-2/FLU/RSV plus assay is intended as an aid in the diagnosis of influenza from Nasopharyngeal swab specimens and should not be used as a sole basis for treatment. Nasal washings and aspirates are unacceptable for Xpert Xpress SARS-CoV-2/FLU/RSV testing.  Fact Sheet for Patients: EntrepreneurPulse.com.au  Fact Sheet for Healthcare Providers: IncredibleEmployment.be  This test is not yet approved or cleared by the [place] FDA and has been authorized for detection and/or diagnosis of SARS-CoV-2 by FDA under an Emergency Use Authorization (EUA). This EUA will remain in effect (meaning this test can be used) for the duration of the COVID-19 declaration under Section 564(b)(1) of the Act, 21 U.S.C. section 360bbb-3(b)(1), unless the authorization is terminated or revoked.  Performed at [facility], [place] [address] [place], [place] [address]   Respiratory (~20 pathogens) panel by PCR     Status: Abnormal   Collection Time: 05/06/22  6:05 PM   Specimen: Nasopharyngeal Swab; Respiratory  Result Value Ref Range Status   Adenovirus NOT DETECTED NOT DETECTED Final   Coronavirus 229E NOT DETECTED NOT DETECTED Final    Comment: (NOTE) The Coronavirus on the Respiratory Panel, DOES NOT test for the novel  Coronavirus (2019 nCoV)    Coronavirus HKU1 NOT DETECTED NOT DETECTED Final   Coronavirus NL63 NOT DETECTED NOT DETECTED Final   Coronavirus OC43 NOT DETECTED NOT DETECTED Final   Metapneumovirus NOT DETECTED NOT DETECTED Final   Rhinovirus / Enterovirus NOT DETECTED NOT DETECTED Final   Influenza A NOT DETECTED NOT DETECTED Final   Influenza B NOT DETECTED NOT DETECTED Final   Parainfluenza Virus 1 NOT DETECTED NOT DETECTED Final   Parainfluenza Virus 2  NOT DETECTED NOT DETECTED Final   Parainfluenza Virus 3 NOT DETECTED NOT DETECTED Final   Parainfluenza Virus 4 NOT DETECTED NOT DETECTED Final   Respiratory Syncytial Virus DETECTED (A) NOT DETECTED Final   Bordetella pertussis NOT DETECTED NOT DETECTED Final   Bordetella Parapertussis NOT DETECTED NOT DETECTED Final   Chlamydophila pneumoniae NOT DETECTED NOT DETECTED Final   Mycoplasma pneumoniae NOT DETECTED NOT DETECTED Final    Comment: Performed at [facility], [place]. [address] [place], [place] [address]  MRSA Next Gen by PCR, Nasal     Status: None   Collection Time: 05/07/22  2:59 AM   Specimen: Nasal Mucosa; Nasal Swab  Result Value Ref Range  Status   MRSA by PCR Next Gen NOT DETECTED NOT DETECTED Final    Comment: (NOTE) The GeneXpert MRSA Assay (FDA approved for NASAL specimens only), is one component of a comprehensive MRSA colonization surveillance program. It is not intended to diagnose MRSA infection nor to guide or monitor treatment for MRSA infections. Test performance is not FDA approved in patients less than 23 years old. Performed at [facility], [place] [address], [place], [place] [address]     Antimicrobials: Anti-infectives (From admission, onward)    Start     Dose/Rate Route Frequency Ordered Stop   05/07/22 0700  atovaquone (MEPRON) 750 MG/5ML suspension 1,500 mg        1,500 mg Oral Every morning 05/07/22 0313     05/07/22 0430  hydroxychloroquine (PLAQUENIL) tablet 200 mg        200 mg Oral 2 times daily 05/07/22 0330     05/07/22 0415  levofloxacin (LEVAQUIN) IVPB 750 mg  Status:  Discontinued        750 mg 100 mL/hr over 90 Minutes Intravenous Every 24 hours 05/07/22 0329 05/07/22 1148      Culture/Microbiology    Component Value Date/Time   SDES THROAT 03/05/2017 0914   SPECREQUEST NONE Reflexed from [id number] 03/05/2017 0914   CULT NO GROUP A STREP (S.PYOGENES) ISOLATED 03/05/2017 0914   REPTSTATUS 03/07/2017 FINAL  03/05/2017 0914    Radiology Studies: No results found.   LOS: 4 days   Antonieta Pert, MD [facility]  05/11/2022, 1:54 PM

## 2022-06-15 ENCOUNTER — Encounter (HOSPITAL_BASED_OUTPATIENT_CLINIC_OR_DEPARTMENT_OTHER): Payer: Medicaid Other | Admitting: Pulmonary Disease

## 2022-06-19 ENCOUNTER — Telehealth (HOSPITAL_COMMUNITY): Payer: Self-pay

## 2022-06-19 NOTE — Telephone Encounter (Signed)
Called patient to see if she was interested in participating in the Pulmonary Rehab Program. Patient stated yes. Patient will come in for orientation on 06/27/22_0 :30am and will attend the 1:15pm exercise class.

## 2022-06-19 NOTE — Telephone Encounter (Signed)
Pt insurance is active and benefits verified through [facility] Co-pay 4, DED 0/0 met, out of pocket 0/0 met, co-insurance 0%. no pre-authorization required, [employer] 06/19/2022_0 :49pm, REF# [id number]

## 2022-06-26 ENCOUNTER — Telehealth (HOSPITAL_COMMUNITY): Payer: Self-pay

## 2022-06-26 NOTE — Telephone Encounter (Signed)
Called to confirm appt. Pt confirmed appt. Instructed pt on proper footwear and COVID screening. Gave directions along with department number.   

## 2022-06-27 ENCOUNTER — Ambulatory Visit (HOSPITAL_COMMUNITY): Payer: Medicaid Other

## 2022-06-27 ENCOUNTER — Telehealth (HOSPITAL_COMMUNITY): Payer: Self-pay

## 2022-06-27 NOTE — Telephone Encounter (Signed)
Pt called and needed to reschedule for pulmonary rehab. Pt rescheduled for orientation on 07/04/22_0 :30pm and will attend the 1:15pm exercise sessions.

## 2022-06-27 NOTE — Telephone Encounter (Signed)
Pt called and wanted to reschedule her pulmonary rehab. This is pt 3rd time scheduling and canceling or no showing to pulmonary rehab. Pt referral will be closed until further notice.

## 2022-07-03 ENCOUNTER — Telehealth (HOSPITAL_COMMUNITY): Payer: Self-pay | Admitting: *Deleted

## 2022-07-03 ENCOUNTER — Ambulatory Visit (HOSPITAL_COMMUNITY): Payer: Medicaid Other

## 2022-07-03 NOTE — Telephone Encounter (Signed)
Called Rhonda Franco to confirm her PR orientation appointment. She states that she will be coming. We discussed proper shoes, mask, directions to the department, walk test and our contact number. She voices understanding.

## 2022-07-04 ENCOUNTER — Encounter (HOSPITAL_COMMUNITY)
Admission: RE | Admit: 2022-07-04 | Discharge: 2022-07-04 | Disposition: A | Discharge status: Home / Self Care | Payer: Medicaid Other | Source: Ambulatory Visit | Attending: Critical Care Medicine | Admitting: Critical Care Medicine

## 2022-07-04 ENCOUNTER — Encounter (HOSPITAL_COMMUNITY): Payer: Self-pay

## 2022-07-04 VITALS — BP 128/76 | HR 140 | Ht 65.0 in | Wt 217.4 lb

## 2022-07-04 DIAGNOSIS — M3213 Lung involvement in systemic lupus erythematosus: Secondary | ICD-10-CM | POA: Insufficient documentation

## 2022-07-04 NOTE — Progress Notes (Signed)
Pulmonary Individual Treatment Plan  Patient Details  Name: Rhonda Franco MRN: [id number] Date of Birth: 18-Oct-2000 Referring Provider:   April Manson Pulmonary Rehab Walk Test from 07/04/2022 in [facility] [place]  Referring Provider Maceo Pro       Initial Encounter Date:  Flowsheet Row Pulmonary Rehab Walk Test from 07/04/2022 in [facility] [place]  Date 07/04/22       Visit Diagnosis: Lung involvement in systemic lupus erythematosus ([place])  Patient's Home Medications on Admission:   Current Outpatient Medications:    acetaZOLAMIDE (DIAMOX) 250 MG tablet, Take 250 mg by mouth 2 (two) times daily., Disp: , Rfl:    aspirin EC 81 MG tablet, Take 81 mg by mouth daily., Disp: , Rfl:    atovaquone (MEPRON) 750 MG/5ML suspension, Take 10 mLs by mouth in the morning., Disp: , Rfl:    ergocalciferol (VITAMIN D2) 1.25 MG (50000 UT) capsule, Take 50,000 Units by mouth once a week., Disp: , Rfl:    famotidine (PEPCID) 20 MG tablet, Take 20 mg by mouth 2 (two) times daily., Disp: , Rfl:    hydroxychloroquine (PLAQUENIL) 200 MG tablet, Take 1 tablet (200 mg total) by mouth 2 (two) times daily., Disp: 60 tablet, Rfl: 0   mycophenolate (MYFORTIC) 360 MG TBEC EC tablet, Take 720 mg by mouth every 12 (twelve) hours., Disp: , Rfl:    potassium chloride SA (KLOR-CON M) 20 MEQ tablet, Take 20 mEq by mouth 3 (three) times daily., Disp: , Rfl:    predniSONE (DELTASONE) 20 MG tablet, Take 2 tabs until 05/18/22 then 1 tab daily x 7 days (Patient taking differently: Take 7.5 mg by mouth daily with breakfast. 7.5 mg taper), Disp: 21 tablet, Rfl: 0   torsemide (DEMADEX) 20 MG tablet, Take 20 mg by mouth 2 (two) times daily., Disp: , Rfl:    Treprostinil (TYVASO IN), Inhale 8 puffs into the lungs 4 (four) times daily., Disp: , Rfl:   Past Medical History: Past Medical History:  Diagnosis Date   Anemia     Class 2 obesity 03/14/2022   Lupus ([place])    Prediabetes    Pulmonary hypertension ([place]) 10/21/2021   Formatting of this note might be different from the original. 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  [place] Data: - PFT 10/19/21: FVC 31% pred, FEV1 33% pred, FEV1/FVC 94%, DLCO 34% pred. Consistent with severe restriction, no obstruction and severe decrease in DLCO. Normal DLCO/FVC ratio. - TTE 11/23/20: Normal LV size with mildly reduced function (LVEF 50%). Normal   Sleep apnea 08/25/2008   Formatting of this note might be different from the original. ICD10 Conversion    Tobacco Use: Social History   Tobacco Use  Smoking Status Never  Smokeless Tobacco Never    Labs: Review Flowsheet  More data exists      Latest Ref Rng & Units 03/18/2022 03/25/2022 05/06/2022 05/07/2022 05/11/2022  Labs for ITP Cardiac and Pulmonary Rehab  PH, Arterial 7.35 - 7.45 - 7.48  - - 7.47   PCO2 arterial 32 - 48 mmHg - 45  - - 54   Bicarbonate 20.0 - 28.0 mmol/L - 33.5  36.4  34.4  39.3   O2 Saturation % 86.7  95  59.4  98.8  100     Capillary Blood Glucose: Lab Results  Component Value Date   GLUCAP 86 03/26/2022   GLUCAP 208 (  H) 03/25/2022   GLUCAP 235 (H) 03/25/2022   GLUCAP 190 (H) 03/25/2022   GLUCAP 168 (H) 03/25/2022     Pulmonary Assessment Scores:  Pulmonary Assessment Scores     Row Name 07/04/22 1351         ADL UCSD   ADL Phase Entry     SOB Score total 74       CAT Score   CAT Score 22       mMRC Score   mMRC Score 4             UCSD: Self-administered rating of dyspnea associated with activities of daily living (ADLs) 6-point scale (0 = "not at all" to 5 = "maximal or unable to do because of breathlessness")  Scoring Scores range from 0 to 120.  Minimally important difference is 5 units  CAT: CAT can identify the health impairment of COPD patients and is better correlated with disease progression.  CAT has a scoring range of  zero to 40. The CAT score is classified into four groups of low (less than 10), medium (10 - 20), high (21-30) and very high (31-40) based on the impact level of disease on health status. A CAT score over 10 suggests significant symptoms.  A worsening CAT score could be explained by an exacerbation, poor medication adherence, poor inhaler technique, or progression of COPD or comorbid conditions.  CAT MCID is 2 points  mMRC: mMRC (Modified Medical Research Council) Dyspnea Scale is used to assess the degree of baseline functional disability in patients of respiratory disease due to dyspnea. No minimal important difference is established. A decrease in score of 1 point or greater is considered a positive change.   Pulmonary Function Assessment:  Pulmonary Function Assessment - 07/04/22 1520       Breath   Bilateral Breath Sounds Decreased    Shortness of Breath Limiting activity             Exercise Target Goals: Exercise Program Goal: Individual exercise prescription set using results from initial 6 min walk test and THRR while considering  patient's activity barriers and safety.   Exercise Prescription Goal: Initial exercise prescription builds to 30-45 minutes a day of aerobic activity, 2-3 days per week.  Home exercise guidelines will be given to patient during program as part of exercise prescription that the participant will acknowledge.  Activity Barriers & Risk Stratification:  Activity Barriers & Cardiac Risk Stratification - 07/04/22 1352       Activity Barriers & Cardiac Risk Stratification   Activity Barriers Balance Concerns;Shortness of Breath;Muscular Weakness;Deconditioning    Cardiac Risk Stratification Moderate             6 Minute Walk:  6 Minute Walk     Row Name 07/04/22 1509         6 Minute Walk   Phase Initial     Distance 420 feet     Walk Time 6 minutes  rest from 2:00-5:20     # of Rest Breaks 1     MPH 0.8     METS 3.86     RPE 15      Perceived Dyspnea  3     VO2 Peak 13.5     Symptoms Yes (comment)     Comments HR up to 169 and SpO2 down to 82 plus significant SOB/exertion therefore rested sitting for 3 min, 20 sec     Resting HR 140 bpm     Resting  BP 128/76     Resting Oxygen Saturation  90 %     Exercise Oxygen Saturation  during 6 min walk 77 %  77 on 15L lowest noted     Max Ex. HR 169 bpm     Max Ex. BP 112/80     2 Minute Post BP 126/84       Interval HR   1 Minute HR 140     2 Minute HR 166     3 Minute HR 169     4 Minute HR 163     5 Minute HR 142     6 Minute HR 159     2 Minute Post HR 159     Interval Heart Rate? Yes       Interval Oxygen   Interval Oxygen? Yes     Baseline Oxygen Saturation % 90 %  90 6L     1 Minute Oxygen Saturation % 89 %     1 Minute Liters of Oxygen 15 L     2 Minute Oxygen Saturation % 82 %  began standing rest     2 Minute Liters of Oxygen 15 L     3 Minute Oxygen Saturation % 82 %  sitting rest     3 Minute Liters of Oxygen 15 L     4 Minute Oxygen Saturation % 89 %  continued sitting rest to increase spo2 more     4 Minute Liters of Oxygen 15 L     5 Minute Oxygen Saturation % 97 %  walked last 40 sec     5 Minute Liters of Oxygen 15 L     6 Minute Oxygen Saturation % 90 %     6 Minute Liters of Oxygen 15 L     2 Minute Post Oxygen Saturation % 83 %  up to 91 after 30 more seconds of rest     2 Minute Post Liters of Oxygen 15 L              Oxygen Initial Assessment:  Oxygen Initial Assessment - 07/04/22 1354       Home Oxygen   Home Oxygen Device Home Concentrator;Liquid Oxygen    Sleep Oxygen Prescription BiPAP    Liters per minute 5    Home Exercise Oxygen Prescription Continuous    Liters per minute 15    Home Resting Oxygen Prescription Continuous    Liters per minute 5    Compliance with Home Oxygen Use Yes      Initial 6 min Walk   Oxygen Used Continuous    Liters per minute 15      Program Oxygen Prescription   Program Oxygen  Prescription Continuous    Liters per minute 15    Comments pt easily desats with walking. Will begin with seated exercise and hope to progress to walking as strength builds      Intervention   Short Term Goals To learn and exhibit compliance with exercise, home and travel O2 prescription;To learn and understand importance of maintaining oxygen saturations>88%;To learn and demonstrate proper use of respiratory medications;To learn and understand importance of monitoring SPO2 with pulse oximeter and demonstrate accurate use of the pulse oximeter.;To learn and demonstrate proper pursed lip breathing techniques or other breathing techniques. ;Other    Long  Term Goals Demonstrates proper use of MDI's;Compliance with respiratory medication;Exhibits proper breathing techniques, such as pursed lip breathing or other method taught during program session;Maintenance of  O2 saturations>88%;Verbalizes importance of monitoring SPO2 with pulse oximeter and return demonstration;Exhibits compliance with exercise, home  and travel O2 prescription             Oxygen Re-Evaluation:   Oxygen Discharge (Final Oxygen Re-Evaluation):   Initial Exercise Prescription:  Initial Exercise Prescription - 07/04/22 1500       Date of Initial Exercise RX and Referring Provider   Date 07/04/22    Referring Provider Maceo Pro    Expected Discharge Date 09/06/22      Oxygen   Oxygen Continuous    Liters 15    Maintain Oxygen Saturation 88% or higher      Recumbant Bike   Level 1    RPM 60    Minutes 15      Recumbant Elliptical   Level 1    RPM 60    Minutes 15      Prescription Details   Frequency (times per week) 2    Duration Progress to 30 minutes of continuous aerobic without signs/symptoms of physical distress      Intensity   THRR 40-80% of Max Heartrate 79-158    Ratings of Perceived Exertion 11-13    Perceived Dyspnea 0-4      Progression   Progression Continue progressive overload as per  policy without signs/symptoms or physical distress.      Resistance Training   Training Prescription Yes    Weight red bands    Reps 10-15             Perform Capillary Blood Glucose checks as needed.  Exercise Prescription Changes:   Exercise Comments:   Exercise Goals and Review:   Exercise Goals     Row Name 07/04/22 1353             Exercise Goals   Increase Physical Activity Yes       Intervention Provide advice, education, support and counseling about physical activity/exercise needs.;Develop an individualized exercise prescription for aerobic and resistive training based on initial evaluation findings, risk stratification, comorbidities and participant's personal goals.       Expected Outcomes Short Term: Attend rehab on a regular basis to increase amount of physical activity.;Long Term: Exercising regularly at least 3-5 days a week.;Long Term: Add in home exercise to make exercise part of routine and to increase amount of physical activity.       Increase Strength and Stamina Yes       Intervention Provide advice, education, support and counseling about physical activity/exercise needs.;Develop an individualized exercise prescription for aerobic and resistive training based on initial evaluation findings, risk stratification, comorbidities and participant's personal goals.       Expected Outcomes Short Term: Increase workloads from initial exercise prescription for resistance, speed, and METs.;Short Term: Perform resistance training exercises routinely during rehab and add in resistance training at home;Long Term: Improve cardiorespiratory fitness, muscular endurance and strength as measured by increased METs and functional capacity (6MWT)       Able to understand and use rate of perceived exertion (RPE) scale Yes       Intervention Provide education and explanation on how to use RPE scale       Expected Outcomes Short Term: Able to use RPE daily in rehab to express  subjective intensity level;Long Term:  Able to use RPE to guide intensity level when exercising independently       Able to understand and use Dyspnea scale Yes       Intervention Provide  education and explanation on how to use Dyspnea scale       Expected Outcomes Short Term: Able to use Dyspnea scale daily in rehab to express subjective sense of shortness of breath during exertion;Long Term: Able to use Dyspnea scale to guide intensity level when exercising independently       Knowledge and understanding of Target Heart Rate Range (THRR) Yes       Intervention Provide education and explanation of THRR including how the numbers were predicted and where they are located for reference       Expected Outcomes Short Term: Able to state/look up THRR;Long Term: Able to use THRR to govern intensity when exercising independently;Short Term: Able to use daily as guideline for intensity in rehab       Understanding of Exercise Prescription Yes       Intervention Provide education, explanation, and written materials on patient's individual exercise prescription       Expected Outcomes Short Term: Able to explain program exercise prescription;Long Term: Able to explain home exercise prescription to exercise independently                Exercise Goals Re-Evaluation :   Discharge Exercise Prescription (Final Exercise Prescription Changes):   Nutrition:  Target Goals: Understanding of nutrition guidelines, daily intake of sodium <1520m, cholesterol <2054m calories 30% from fat and 7% or less from saturated fats, daily to have 5 or more servings of fruits and vegetables.  Biometrics:  Pre Biometrics - 07/04/22 1515       Pre Biometrics   Grip Strength 22 kg              Nutrition Therapy Plan and Nutrition Goals:   Nutrition Assessments:  MEDIFICTS Score Key: ?70 Need to make dietary changes  40-70 Heart Healthy Diet ? 40 Therapeutic Level Cholesterol Diet  Flowsheet Row  [facility]  Picture Your Plate Total Score on Admission 69      Picture Your Plate Scores: <4<37nhealthy dietary pattern with much room for improvement. 41-50 Dietary pattern unlikely to meet recommendations for good health and room for improvement. 51-60 More healthful dietary pattern, with some room for improvement.  >60 Healthy dietary pattern, although there may be some specific behaviors that could be improved.    Nutrition Goals Re-Evaluation:   Nutrition Goals Discharge (Final Nutrition Goals Re-Evaluation):   Psychosocial: Target Goals: Acknowledge presence or absence of significant depression and/or stress, maximize coping skills, provide positive support system. Participant is able to verbalize types and ability to use techniques and skills needed for reducing stress and depression.  Initial Review & Psychosocial Screening:  Initial Psych Review & Screening - 07/04/22 1345       Initial Review   Current issues with None Identified      Family Dynamics   Good Support System? Yes    Comments mom, sister, aunts, uncles      Barriers   Psychosocial barriers to participate in program The patient should benefit from training in stress management and relaxation.      Screening Interventions   Interventions Encouraged to exercise    Expected Outcomes Short Term goal: Utilizing psychosocial counselor, staff and physician to assist with identification of specific Stressors or current issues interfering with healing process. Setting desired goal for each stressor or current issue identified.;Long Term Goal: Stressors or current issues are controlled or eliminated.;Long Term goal: The participant improves quality of Life and PHQ9  Scores as seen by post scores and/or verbalization of changes;Short Term goal: Identification and review with participant of any Quality of Life or Depression concerns found by scoring the  questionnaire.             Quality of Life Scores:  Scores of 19 and below usually indicate a poorer quality of life in these areas.  A difference of  2-3 points is a clinically meaningful difference.  A difference of 2-3 points in the total score of the Quality of Life Index has been associated with significant improvement in overall quality of life, self-image, physical symptoms, and general health in studies assessing change in quality of life.  PHQ-9: Review Flowsheet  More data exists      07/04/2022 11/13/2021 05/04/2021 06/16/2020 08/17/2019  Depression screen PHQ 2/9  Decreased Interest 1 1 0 1 0  Down, Depressed, Hopeless _0 0 0  PHQ - 2 Score _1 0  Altered sleeping 0 1 1 - -  Tired, decreased energy _2 - -  Change in appetite _3 - -  Feeling bad or failure about yourself  0 0 0 - -  Trouble concentrating 0 0 0 - -  Moving slowly or fidgety/restless 0 0 0 - -  Suicidal thoughts 0 0 0 - -  PHQ-9 Score _4 - -  Difficult doing work/chores Somewhat difficult Not difficult at all Somewhat difficult - -   Interpretation of Total Score  Total Score Depression Severity:  1-4 = Minimal depression, 5-9 = Mild depression, 10-14 = Moderate depression, 15-19 = Moderately severe depression, 20-27 = Severe depression   Psychosocial Evaluation and Intervention:  Psychosocial Evaluation - 07/04/22 1347       Psychosocial Evaluation & Interventions   Interventions Encouraged to exercise with the program and follow exercise prescription    Comments Bethsaida feels supported and mentally stable given her health concerns. She is motivated to exercise and participate in class.    Expected Outcomes For pt to do PR.    Continue Psychosocial Services  No Follow up required             Psychosocial Re-Evaluation:   Psychosocial Discharge (Final Psychosocial Re-Evaluation):   Education: Education Goals: Education classes will be provided on a weekly basis, covering  required topics. Participant will state understanding/return demonstration of topics presented.  Learning Barriers/Preferences:  Learning Barriers/Preferences - 07/04/22 1348       Learning Barriers/Preferences   Learning Barriers None    Learning Preferences Skilled Demonstration;Video;Individual Instruction;Audio             Education Topics: Introduction to Pulmonary Rehab Group instruction provided by PowerPoint, verbal discussion, and written material to support subject matter. Instructor reviews what Pulmonary Rehab is, the purpose of the program, and how patients are referred.     Know Your Numbers Group instruction that is supported by a PowerPoint presentation. Instructor discusses importance of knowing and understanding resting, exercise, and post-exercise oxygen saturation, heart rate, and blood pressure. Oxygen saturation, heart rate, blood pressure, rating of perceived exertion, and dyspnea are reviewed along with a normal range for these values.    Exercise for the Pulmonary Patient Group instruction that is supported by a PowerPoint presentation. Instructor discusses benefits of exercise, core components of exercise, frequency, duration, and intensity of an exercise routine, importance of utilizing pulse oximetry during exercise, safety while exercising, and options of places to exercise outside of rehab.  MET Level  Group instruction provided by PowerPoint, verbal discussion, and written material to support subject matter. Instructor reviews what METs are and how to increase METs.    Pulmonary Medications Verbally interactive group education provided by instructor with focus on inhaled medications and proper administration.   Anatomy and Physiology of the Respiratory System Group instruction provided by PowerPoint, verbal discussion, and written material to support subject matter. Instructor reviews respiratory cycle and anatomical components of the  respiratory system and their functions. Instructor also reviews differences in obstructive and restrictive respiratory diseases with examples of each.    Oxygen Safety Group instruction provided by PowerPoint, verbal discussion, and written material to support subject matter. There is an overview of "What is Oxygen" and "Why do we need it".  Instructor also reviews how to create a safe environment for oxygen use, the importance of using oxygen as prescribed, and the risks of noncompliance. There is a brief discussion on traveling with oxygen and resources the patient may utilize.   Oxygen Use Group instruction provided by PowerPoint, verbal discussion, and written material to discuss how supplemental oxygen is prescribed and different types of oxygen supply systems. Resources for more information are provided.    Breathing Techniques Group instruction that is supported by demonstration and informational handouts. Instructor discusses the benefits of pursed lip and diaphragmatic breathing and detailed demonstration on how to perform both.     Risk Factor Reduction Group instruction that is supported by a PowerPoint presentation. Instructor discusses the definition of a risk factor, different risk factors for pulmonary disease, and how the heart and lungs work together.   MD Day A group question and answer session with a medical doctor that allows participants to ask questions that relate to their pulmonary disease state.   Nutrition for the Pulmonary Patient Group instruction provided by PowerPoint slides, verbal discussion, and written materials to support subject matter. The instructor gives an explanation and review of healthy diet recommendations, which includes a discussion on weight management, recommendations for fruit and vegetable consumption, as well as protein, fluid, caffeine, fiber, sodium, sugar, and alcohol. Tips for eating when patients are short of breath are discussed.     Other Education Group or individual verbal, written, or video instructions that support the educational goals of the pulmonary rehab program.    Knowledge Questionnaire Score:  Knowledge Questionnaire Score - 07/04/22 1348       Knowledge Questionnaire Score   Pre Score 17/18             Core Components/Risk Factors/Patient Goals at Admission:  Personal Goals and Risk Factors at Admission - 07/04/22 1355       Core Components/Risk Factors/Patient Goals on Admission    Weight Management Weight Loss    Improve shortness of breath with ADL's Yes    Intervention Provide education, individualized exercise plan and daily activity instruction to help decrease symptoms of SOB with activities of daily living.    Expected Outcomes Short Term: Improve cardiorespiratory fitness to achieve a reduction of symptoms when performing ADLs;Long Term: Be able to perform more ADLs without symptoms or delay the onset of symptoms    Personal Goal Other Yes    Personal Goal build muscle mass    Intervention resistance bands in PR and at home    Expected Outcomes increase grip strength             Core Components/Risk Factors/Patient Goals Review:    Core Components/Risk Factors/Patient Goals at Discharge (Final  Review):    ITP Comments:    Comments: Dr. Rodman Pickle is Medical Director for Pulmonary Rehab at [facility].

## 2022-07-04 NOTE — Progress Notes (Signed)
Rhonda Franco 21 y.o. female Pulmonary Rehab Orientation Note This patient who was referred to Pulmonary Rehab by Dr. 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 She  arrived ambulatory with normal gait. She  does carry portable oxygen. [employer] is the provider for their DME. Per patient, Sakai uses oxygen continuously. Color good, skin warm and dry. Patient is oriented to time and place. Patient's medical history, psychosocial health, and medications reviewed. Psychosocial assessment reveals patient lives with family. Rhonda Franco is currently unemployed. Patient hobbies include listening to music and playing video games. Patient reports her stress level is low. Areas of stress/anxiety include health. Patient does not exhibit signs of depression.  PHQ2/9 score 2/4. Sincere shows good  coping skills with positive outlook on life. Offered emotional support and reassurance. Will continue to monitor and evaluate progress toward psychosocial goal(s) of managing mental health. Physical assessment performed by Maurice Small RN. Please see their orientation physical assessment note. Rhonda Franco reports she does take medications as prescribed. Patient states she follows a regular  diet.  Pt sts she has been told she needs to achieve a BMI of 27 for lung transplant, which she would like to pursue.. Patient's weight will be monitored closely. Demonstration and practice of PLB using pulse oximeter. Latajah able to return demonstration satisfactorily. Safety and hand hygiene in the exercise area reviewed with patient. Sheily voices understanding of the information reviewed. Department expectations discussed with patient and achievable goals were set. The patient shows enthusiasm about attending the program and we look forward to working with Rhonda Franco. Rhonda Franco completed a 6 min walk test today and is scheduled to begin exercise on  07/10/21 at 1:15.   [id number] Darrick Meigs, BS, ACSM-CEP

## 2022-07-05 ENCOUNTER — Ambulatory Visit (HOSPITAL_COMMUNITY): Payer: Medicaid Other

## 2022-07-06 NOTE — Progress Notes (Signed)
Pulmonary Rehab Orientation Physical Assessment Note  Physical assessment reveals  Pt is alert and oriented x 3.  Heart rate is tachycardic, breath sounds diminished. throughout Reports productive at times cough. Bowel sounds present. Pt denies abdominal discomfort, nausea, vomiting or diarrhea. Grip strength equal, strong. Distal pulses palpable; trace swelling to lower extremities. Cherre Huger, BSN Cardiac and Training and development officer

## 2022-07-10 ENCOUNTER — Ambulatory Visit (HOSPITAL_COMMUNITY): Payer: Medicaid Other

## 2022-07-10 ENCOUNTER — Encounter (HOSPITAL_COMMUNITY)
Admission: RE | Admit: 2022-07-10 | Discharge: 2022-07-10 | Disposition: A | Discharge status: Home / Self Care | Payer: Medicaid Other | Source: Ambulatory Visit | Attending: Pulmonary Disease | Admitting: Pulmonary Disease

## 2022-07-10 VITALS — Wt 213.0 lb

## 2022-07-10 DIAGNOSIS — M3213 Lung involvement in systemic lupus erythematosus: Secondary | ICD-10-CM | POA: Insufficient documentation

## 2022-07-10 DIAGNOSIS — J849 Interstitial pulmonary disease, unspecified: Secondary | ICD-10-CM | POA: Diagnosis present

## 2022-07-10 NOTE — Progress Notes (Signed)
Daily Session Note  Patient Details  Name: Rhonda Franco MRN: [id number] Date of Birth: 2001-04-26 Referring Provider:   April Manson Pulmonary Rehab Walk Test from 07/04/2022 in [facility] for Heart, Vascular, & [place]  Referring Provider Maceo Pro       Encounter Date: 07/10/2022  Check In:  Session Check In - 07/10/22 1602       Check-In   Supervising physician immediately available to respond to emergencies [facility] - Physician supervision    Physician(s) Gala Romney    Location [facility]    Staff Present Maurice Small, RN, Luisa Hart, RN, BSN;Lisa Ysidro Evert, Cathleen Fears, MS, ACSM-CEP, Exercise Physiologist;Randi Olen Cordial BS, ACSM-CEP, Exercise Physiologist    Virtual Visit No    Medication changes reported     No    Fall or balance concerns reported    No    Tobacco Cessation No Change    Warm-up and Cool-down Performed as group-led instruction    Resistance Training Performed Yes    VAD Patient? No    PAD/SET Patient? No      Pain Assessment   Currently in Pain? No/denies    Multiple Pain Sites No             Capillary Blood Glucose: No results found for this or any previous visit (from the past 24 hour(s)).   Exercise Prescription Changes - 07/10/22 1600       Response to Exercise   Blood Pressure (Admit) 130/72    Blood Pressure (Exercise) 110/70    Blood Pressure (Exit) 102/70    Heart Rate (Admit) 131 bpm    Heart Rate (Exercise) 158 bpm    Heart Rate (Exit) 140 bpm    Oxygen Saturation (Admit) 96 %    Oxygen Saturation (Exercise) 96 %    Oxygen Saturation (Exit) 94 %    Rating of Perceived Exertion (Exercise) 13    Perceived Dyspnea (Exercise) 2    Duration Progress to 30 minutes of  aerobic without signs/symptoms of physical distress    Intensity THRR unchanged      Progression   Progression Continue to progress workloads to maintain intensity without signs/symptoms of physical distress.       Resistance Training   Training Prescription Yes    Weight red bands    Reps 10-15    Time 10 Minutes      Oxygen   Oxygen Continuous    Liters 15      Recumbant Bike   Level 1    RPM 60    Minutes 15    METs 1.4      Recumbant Elliptical   Level 1    Minutes 15    METs 1.4      Oxygen   Maintain Oxygen Saturation 88% or higher             Social History   Tobacco Use  Smoking Status Never  Smokeless Tobacco Never    Goals Met:  Exercise tolerated well No report of concerns or symptoms today Strength training completed today  Goals Unmet:  Not Applicable  Comments: Service time is from 1320 to 1435.    Dr. Rodman Pickle is Medical Director for Pulmonary Rehab at [facility].

## 2022-07-12 ENCOUNTER — Encounter (HOSPITAL_COMMUNITY)
Admission: RE | Admit: 2022-07-12 | Discharge: 2022-07-12 | Disposition: A | Discharge status: Home / Self Care | Payer: Medicaid Other | Source: Ambulatory Visit | Attending: Pulmonary Disease | Admitting: Pulmonary Disease

## 2022-07-12 ENCOUNTER — Ambulatory Visit (HOSPITAL_COMMUNITY): Payer: Medicaid Other

## 2022-07-12 DIAGNOSIS — J849 Interstitial pulmonary disease, unspecified: Secondary | ICD-10-CM

## 2022-07-12 DIAGNOSIS — M3213 Lung involvement in systemic lupus erythematosus: Secondary | ICD-10-CM | POA: Diagnosis not present

## 2022-07-12 NOTE — Progress Notes (Signed)
Daily Session Note  Patient Details  Name: Rhonda Franco MRN: [id number] Date of Birth: 05-26-2001 Referring Provider:   April Manson Pulmonary Rehab Walk Test from 07/04/2022 in [facility] for Heart, Vascular, & [place]  Referring Provider Maceo Pro       Encounter Date: 07/12/2022  Check In:  Session Check In - 07/12/22 1522       Check-In   Supervising physician immediately available to respond to emergencies [facility] - Physician supervision    Physician(s) Gala Romney    Location [facility]    Staff Present Janine Ores, RN, BSN;Lisa Ysidro Evert, Cathleen Fears, MS, ACSM-CEP, Exercise Physiologist;Randi Olen Cordial BS, ACSM-CEP, Exercise Physiologist    Virtual Visit No    Medication changes reported     No    Fall or balance concerns reported    No    Tobacco Cessation No Change    Warm-up and Cool-down Performed as group-led instruction    Resistance Training Performed Yes    VAD Patient? No    PAD/SET Patient? No      Pain Assessment   Currently in Pain? No/denies    Multiple Pain Sites No             Capillary Blood Glucose: No results found for this or any previous visit (from the past 24 hour(s)).    Social History   Tobacco Use  Smoking Status Never  Smokeless Tobacco Never    Goals Met:  Independence with exercise equipment Exercise tolerated well No report of concerns or symptoms today Strength training completed today  Goals Unmet:  Not Applicable  Comments: Service time is from 1312 to 1450    Dr. Rodman Pickle is Medical Director for Pulmonary Rehab at [facility].

## 2022-07-13 ENCOUNTER — Telehealth (HOSPITAL_COMMUNITY): Payer: Self-pay

## 2022-07-16 ENCOUNTER — Other Ambulatory Visit (HOSPITAL_BASED_OUTPATIENT_CLINIC_OR_DEPARTMENT_OTHER): Payer: Self-pay

## 2022-07-17 ENCOUNTER — Encounter (HOSPITAL_COMMUNITY): Payer: Medicaid Other

## 2022-07-17 ENCOUNTER — Telehealth (HOSPITAL_COMMUNITY): Payer: Self-pay

## 2022-07-17 ENCOUNTER — Ambulatory Visit (HOSPITAL_COMMUNITY): Payer: Medicaid Other

## 2022-07-17 ENCOUNTER — Telehealth (HOSPITAL_COMMUNITY): Payer: Self-pay | Admitting: *Deleted

## 2022-07-17 NOTE — Telephone Encounter (Signed)
Called pt about inclement weather. Left a message w/ call back number.

## 2022-07-17 NOTE — Telephone Encounter (Signed)
Gena called out for this afternoon pulmonary rehab session. Destane depends upon her mother to drive her for appointments.  Chayah mother is unable to drive in the rain.  Wanted pulmonary staff to be aware and that she did not want to jeopardize being compliant with PR for her upcoming transplant evaluation 1/29-2/2.   Advised will add the date on the end. Verbalized understanding. Cherre Huger, BSN Cardiac and Training and development officer

## 2022-07-19 ENCOUNTER — Ambulatory Visit (HOSPITAL_COMMUNITY): Payer: Medicaid Other

## 2022-07-19 ENCOUNTER — Encounter (HOSPITAL_COMMUNITY)
Admission: RE | Admit: 2022-07-19 | Discharge: 2022-07-19 | Disposition: A | Discharge status: Home / Self Care | Payer: Medicaid Other | Source: Ambulatory Visit | Attending: Pulmonary Disease | Admitting: Pulmonary Disease

## 2022-07-19 VITALS — Wt 212.5 lb

## 2022-07-19 DIAGNOSIS — M3213 Lung involvement in systemic lupus erythematosus: Secondary | ICD-10-CM

## 2022-07-19 DIAGNOSIS — J849 Interstitial pulmonary disease, unspecified: Secondary | ICD-10-CM

## 2022-07-19 NOTE — Progress Notes (Signed)
Daily Session Note  Patient Details  Name: Sammye Staff MRN: [id number] Date of Birth: [birth year] Referring Provider:   April Manson Pulmonary Rehab Walk Test from 07/04/2022 in [facility] for Heart, Vascular, & [place]  Referring Provider Maceo Pro       Encounter Date: 07/19/2022  Check In:  Session Check In - 07/19/22 1426       Check-In   Supervising physician immediately available to respond to emergencies [facility] MD immediately available    Physician(s) Mannam    Location [facility]    Staff Present Janine Ores, RN, Quentin Ore, MS, ACSM-CEP, Exercise Physiologist;Randi Olen Cordial BS, ACSM-CEP, Exercise Physiologist;Jetta Walker BS, ACSM-CEP, Exercise Physiologist    Virtual Visit No    Medication changes reported     No    Fall or balance concerns reported    No    Tobacco Cessation No Change    Warm-up and Cool-down Performed as group-led instruction    Resistance Training Performed Yes    VAD Patient? No    PAD/SET Patient? No      Pain Assessment   Currently in Pain? No/denies    Multiple Pain Sites No             Capillary Blood Glucose: No results found for this or any previous visit (from the past 24 hour(s)).    Social History   Tobacco Use  Smoking Status Never  Smokeless Tobacco Never    Goals Met:  Independence with exercise equipment Exercise tolerated well Personal goals reviewed No report of concerns or symptoms today Strength training completed today  Goals Unmet:  Not Applicable  Comments: Service time is from 1310 to 1435    Dr. Rodman Pickle is Medical Director for Pulmonary Rehab at [facility].

## 2022-07-24 ENCOUNTER — Encounter (HOSPITAL_COMMUNITY): Payer: Medicaid Other

## 2022-07-24 ENCOUNTER — Ambulatory Visit (HOSPITAL_COMMUNITY): Payer: Medicaid Other

## 2022-07-24 ENCOUNTER — Telehealth (HOSPITAL_COMMUNITY): Payer: Self-pay

## 2022-07-24 NOTE — Telephone Encounter (Signed)
Pt called and stated she will not be able to attend PR today due to having the stomach virus.

## 2022-07-25 NOTE — Progress Notes (Signed)
Pulmonary Individual Treatment Plan  Patient Details  Name: Rhonda Franco MRN: [id number] Date of Birth: 01/19/2001 Referring Provider:   April Manson Pulmonary Rehab Walk Test from 07/04/2022 in [facility] for Heart, Vascular, & [place]  Referring Provider Maceo Pro       Initial Encounter Date:  Flowsheet Row Pulmonary Rehab Walk Test from 07/04/2022 in [facility] for Heart, Vascular, & [place]  Date 07/04/22       Visit Diagnosis: Lung involvement in systemic lupus erythematosus ([place])  ILD (interstitial lung disease) ([place])  Patient's Home Medications on Admission:   Current Outpatient Medications:    acetaZOLAMIDE (DIAMOX) 250 MG tablet, Take 250 mg by mouth 2 (two) times daily., Disp: , Rfl:    aspirin EC 81 MG tablet, Take 81 mg by mouth daily., Disp: , Rfl:    atovaquone (MEPRON) 750 MG/5ML suspension, Take 10 mLs by mouth in the morning., Disp: , Rfl:    ergocalciferol (VITAMIN D2) 1.25 MG (50000 UT) capsule, Take 50,000 Units by mouth once a week., Disp: , Rfl:    famotidine (PEPCID) 20 MG tablet, Take 20 mg by mouth 2 (two) times daily., Disp: , Rfl:    hydroxychloroquine (PLAQUENIL) 200 MG tablet, Take 1 tablet (200 mg total) by mouth 2 (two) times daily., Disp: 60 tablet, Rfl: 0   mycophenolate (MYFORTIC) 360 MG TBEC EC tablet, Take 720 mg by mouth every 12 (twelve) hours., Disp: , Rfl:    potassium chloride SA (KLOR-CON M) 20 MEQ tablet, Take 20 mEq by mouth 3 (three) times daily., Disp: , Rfl:    predniSONE (DELTASONE) 20 MG tablet, Take 2 tabs until 05/18/22 then 1 tab daily x 7 days (Patient taking differently: Take 7.5 mg by mouth daily with breakfast. 7.5 mg taper), Disp: 21 tablet, Rfl: 0   torsemide (DEMADEX) 20 MG tablet, Take 20 mg by mouth 2 (two) times daily., Disp: , Rfl:    Treprostinil (TYVASO IN), Inhale 8 puffs into the lungs 4 (four) times daily., Disp: , Rfl:   Past Medical History: Past  Medical History:  Diagnosis Date   Anemia    Class 2 obesity 03/14/2022   Lupus ([place])    Prediabetes    Pulmonary hypertension ([place]) 10/21/2021   Formatting of this note might be different from the original. 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  [place] Data: - PFT 10/19/21: FVC 31% pred, FEV1 33% pred, FEV1/FVC 94%, DLCO 34% pred. Consistent with severe restriction, no obstruction and severe decrease in DLCO. Normal DLCO/FVC ratio. - TTE 11/23/20: Normal LV size with mildly reduced function (LVEF 50%). Normal   Sleep apnea 08/25/2008   Formatting of this note might be different from the original. ICD10 Conversion    Tobacco Use: Social History   Tobacco Use  Smoking Status Never  Smokeless Tobacco Never    Labs: Review Flowsheet  More data exists      Latest Ref Rng & Units 03/18/2022 03/25/2022 05/06/2022 05/07/2022 05/11/2022  Labs for ITP Cardiac and Pulmonary Rehab  PH, Arterial 7.35 - 7.45 - 7.48  - - 7.47   PCO2 arterial 32 - 48 mmHg - 45  - - 54   Bicarbonate 20.0 - 28.0 mmol/L - 33.5  36.4  34.4  39.3   O2 Saturation % 86.7  95  59.4  98.8  100     Capillary Blood Glucose: Lab Results  Component Value Date   GLUCAP  86 03/26/2022   GLUCAP 208 (H) 03/25/2022   GLUCAP 235 (H) 03/25/2022   GLUCAP 190 (H) 03/25/2022   GLUCAP 168 (H) 03/25/2022     Pulmonary Assessment Scores:  Pulmonary Assessment Scores     Row Name 07/04/22 1351         ADL UCSD   ADL Phase Entry     SOB Score total 74       CAT Score   CAT Score 22       mMRC Score   mMRC Score 4             UCSD: Self-administered rating of dyspnea associated with activities of daily living (ADLs) 6-point scale (0 = "not at all" to 5 = "maximal or unable to do because of breathlessness")  Scoring Scores range from 0 to 120.  Minimally important difference is 5 units  CAT: CAT can identify the health impairment of COPD patients and is better correlated with disease  progression.  CAT has a scoring range of zero to 40. The CAT score is classified into four groups of low (less than 10), medium (10 - 20), high (21-30) and very high (31-40) based on the impact level of disease on health status. A CAT score over 10 suggests significant symptoms.  A worsening CAT score could be explained by an exacerbation, poor medication adherence, poor inhaler technique, or progression of COPD or comorbid conditions.  CAT MCID is 2 points  mMRC: mMRC (Modified Medical Research Council) Dyspnea Scale is used to assess the degree of baseline functional disability in patients of respiratory disease due to dyspnea. No minimal important difference is established. A decrease in score of 1 point or greater is considered a positive change.   Pulmonary Function Assessment:  Pulmonary Function Assessment - 07/04/22 1520       Breath   Bilateral Breath Sounds Decreased    Shortness of Breath Limiting activity             Exercise Target Goals: Exercise Program Goal: Individual exercise prescription set using results from initial 6 min walk test and THRR while considering  patient's activity barriers and safety.   Exercise Prescription Goal: Initial exercise prescription builds to 30-45 minutes a day of aerobic activity, 2-3 days per week.  Home exercise guidelines will be given to patient during program as part of exercise prescription that the participant will acknowledge.  Activity Barriers & Risk Stratification:  Activity Barriers & Cardiac Risk Stratification - 07/04/22 1352       Activity Barriers & Cardiac Risk Stratification   Activity Barriers Balance Concerns;Shortness of Breath;Muscular Weakness;Deconditioning    Cardiac Risk Stratification Moderate             6 Minute Walk:  6 Minute Walk     Row Name 07/04/22 1509         6 Minute Walk   Phase Initial     Distance 420 feet     Walk Time 6 minutes  rest from 2:00-5:20     # of Rest Breaks 1      MPH 0.8     METS 3.86     RPE 15     Perceived Dyspnea  3     VO2 Peak 13.5     Symptoms Yes (comment)     Comments HR up to 169 and SpO2 down to 82 plus significant SOB/exertion therefore rested sitting for 3 min, 20 sec     Resting HR 140  bpm     Resting BP 128/76     Resting Oxygen Saturation  90 %     Exercise Oxygen Saturation  during 6 min walk 77 %  77 on 15L lowest noted     Max Ex. HR 169 bpm     Max Ex. BP 112/80     2 Minute Post BP 126/84       Interval HR   1 Minute HR 140     2 Minute HR 166     3 Minute HR 169     4 Minute HR 163     5 Minute HR 142     6 Minute HR 159     2 Minute Post HR 159     Interval Heart Rate? Yes       Interval Oxygen   Interval Oxygen? Yes     Baseline Oxygen Saturation % 90 %  90 6L     1 Minute Oxygen Saturation % 89 %     1 Minute Liters of Oxygen 15 L     2 Minute Oxygen Saturation % 82 %  began standing rest     2 Minute Liters of Oxygen 15 L     3 Minute Oxygen Saturation % 82 %  sitting rest     3 Minute Liters of Oxygen 15 L     4 Minute Oxygen Saturation % 89 %  continued sitting rest to increase spo2 more     4 Minute Liters of Oxygen 15 L     5 Minute Oxygen Saturation % 97 %  walked last 40 sec     5 Minute Liters of Oxygen 15 L     6 Minute Oxygen Saturation % 90 %     6 Minute Liters of Oxygen 15 L     2 Minute Post Oxygen Saturation % 83 %  up to 91 after 30 more seconds of rest     2 Minute Post Liters of Oxygen 15 L              Oxygen Initial Assessment:  Oxygen Initial Assessment - 07/04/22 1354       Home Oxygen   Home Oxygen Device Home Concentrator;Liquid Oxygen    Sleep Oxygen Prescription BiPAP    Liters per minute 5    Home Exercise Oxygen Prescription Continuous    Liters per minute 15    Home Resting Oxygen Prescription Continuous    Liters per minute 5    Compliance with Home Oxygen Use Yes      Initial 6 min Walk   Oxygen Used Continuous    Liters per minute 15      Program  Oxygen Prescription   Program Oxygen Prescription Continuous    Liters per minute 15    Comments pt easily desats with walking. Will begin with seated exercise and hope to progress to walking as strength builds      Intervention   Short Term Goals To learn and exhibit compliance with exercise, home and travel O2 prescription;To learn and understand importance of maintaining oxygen saturations>88%;To learn and demonstrate proper use of respiratory medications;To learn and understand importance of monitoring SPO2 with pulse oximeter and demonstrate accurate use of the pulse oximeter.;To learn and demonstrate proper pursed lip breathing techniques or other breathing techniques. ;Other    Long  Term Goals Demonstrates proper use of MDI's;Compliance with respiratory medication;Exhibits proper breathing techniques, such as pursed lip breathing or other  method taught during program session;Maintenance of O2 saturations>88%;Verbalizes importance of monitoring SPO2 with pulse oximeter and return demonstration;Exhibits compliance with exercise, home  and travel O2 prescription             Oxygen Re-Evaluation:  Oxygen Re-Evaluation     Row Name 07/24/22 0858             Program Oxygen Prescription   Program Oxygen Prescription Continuous       Liters per minute 15  nonrebreather       Comments pt easily desats with walking. Will begin with seated exercise and hope to progress to walking as strength builds         [place];Liquid Oxygen       Sleep Oxygen Prescription BiPAP       Liters per minute 5       Home Exercise Oxygen Prescription Continuous       Liters per minute 15       Home Resting Oxygen Prescription Continuous       Liters per minute 5       Compliance with Home Oxygen Use Yes         Goals/Expected Outcomes   Short Term Goals To learn and exhibit compliance with exercise, home and travel O2 prescription;To learn and understand  importance of maintaining oxygen saturations>88%;To learn and demonstrate proper use of respiratory medications;To learn and understand importance of monitoring SPO2 with pulse oximeter and demonstrate accurate use of the pulse oximeter.;To learn and demonstrate proper pursed lip breathing techniques or other breathing techniques. ;Other       Long  Term Goals Demonstrates proper use of MDI's;Compliance with respiratory medication;Exhibits proper breathing techniques, such as pursed lip breathing or other method taught during program session;Maintenance of O2 saturations>88%;Verbalizes importance of monitoring SPO2 with pulse oximeter and return demonstration;Exhibits compliance with exercise, home  and travel O2 prescription       Comments Pt is now exercising on the nonrebreather, 100%, 15 L. Her SpO2 is 88-92.       Goals/Expected Outcomes Compliance and understanding of oxygen saturation monitoring and breathing techniques to decrease shortness of breath.                Oxygen Discharge (Final Oxygen Re-Evaluation):  Oxygen Re-Evaluation - 07/24/22 0858       Program Oxygen Prescription   Program Oxygen Prescription Continuous    Liters per minute 15   nonrebreather   Comments pt easily desats with walking. Will begin with seated exercise and hope to progress to walking as strength builds      [place];Liquid Oxygen    Sleep Oxygen Prescription BiPAP    Liters per minute 5    Home Exercise Oxygen Prescription Continuous    Liters per minute 15    Home Resting Oxygen Prescription Continuous    Liters per minute 5    Compliance with Home Oxygen Use Yes      Goals/Expected Outcomes   Short Term Goals To learn and exhibit compliance with exercise, home and travel O2 prescription;To learn and understand importance of maintaining oxygen saturations>88%;To learn and demonstrate proper use of respiratory medications;To learn and understand  importance of monitoring SPO2 with pulse oximeter and demonstrate accurate use of the pulse oximeter.;To learn and demonstrate proper pursed lip breathing techniques or other breathing techniques. ;Other    Long  Term Goals Demonstrates proper use  of MDI's;Compliance with respiratory medication;Exhibits proper breathing techniques, such as pursed lip breathing or other method taught during program session;Maintenance of O2 saturations>88%;Verbalizes importance of monitoring SPO2 with pulse oximeter and return demonstration;Exhibits compliance with exercise, home  and travel O2 prescription    Comments Pt is now exercising on the nonrebreather, 100%, 15 L. Her SpO2 is 88-92.    Goals/Expected Outcomes Compliance and understanding of oxygen saturation monitoring and breathing techniques to decrease shortness of breath.             Initial Exercise Prescription:  Initial Exercise Prescription - 07/04/22 1500       Date of Initial Exercise RX and Referring Provider   Date 07/04/22    Referring Provider Maceo Pro    Expected Discharge Date 09/06/22      Oxygen   Oxygen Continuous    Liters 15    Maintain Oxygen Saturation 88% or higher      Recumbant Bike   Level 1    RPM 60    Minutes 15      Recumbant Elliptical   Level 1    RPM 60    Minutes 15      Prescription Details   Frequency (times per week) 2    Duration Progress to 30 minutes of continuous aerobic without signs/symptoms of physical distress      Intensity   THRR 40-80% of Max Heartrate 79-158    Ratings of Perceived Exertion 11-13    Perceived Dyspnea 0-4      Progression   Progression Continue progressive overload as per policy without signs/symptoms or physical distress.      Resistance Training   Training Prescription Yes    Weight red bands    Reps 10-15             Perform Capillary Blood Glucose checks as needed.  Exercise Prescription Changes:   Exercise Prescription Changes     Row Name  07/10/22 1600             Response to Exercise   Blood Pressure (Admit) 130/72       Blood Pressure (Exercise) 110/70       Blood Pressure (Exit) 102/70       Heart Rate (Admit) 131 bpm       Heart Rate (Exercise) 158 bpm       Heart Rate (Exit) 140 bpm       Oxygen Saturation (Admit) 96 %       Oxygen Saturation (Exercise) 96 %       Oxygen Saturation (Exit) 94 %       Rating of Perceived Exertion (Exercise) 13       Perceived Dyspnea (Exercise) 2       Duration Progress to 30 minutes of  aerobic without signs/symptoms of physical distress       Intensity THRR unchanged         Progression   Progression Continue to progress workloads to maintain intensity without signs/symptoms of physical distress.         Resistance Training   Training Prescription Yes       Weight red bands       Reps 10-15       Time 10 Minutes         Oxygen   Oxygen Continuous       Liters 15         Recumbant Bike   Level 1  RPM 60       Minutes 15       METs 1.4         Recumbant Elliptical   Level 1       Minutes 15       METs 1.4         Oxygen   Maintain Oxygen Saturation 88% or higher                Exercise Comments:   Exercise Comments     Row Name 07/10/22 1619           Exercise Comments Pt completed first day of exercise. She exercised for 15 min on the recumbent bike and recumbent elliptical. Hikari averaged 1.4 METs at level 1 on the recumbent elliptical and 1.4 METs at level 1 on the recumbent elliptical. Chandria performed the warmup and cooldown seated/ standing dependent on her shortness of breath. Discussed METs and how to increase METs.                Exercise Goals and Review:   Exercise Goals     Row Name 07/04/22 1353 07/24/22 0842           Exercise Goals   Increase Physical Activity Yes Yes      Intervention Provide advice, education, support and counseling about physical activity/exercise needs.;Develop an individualized exercise  prescription for aerobic and resistive training based on initial evaluation findings, risk stratification, comorbidities and participant's personal goals. Provide advice, education, support and counseling about physical activity/exercise needs.;Develop an individualized exercise prescription for aerobic and resistive training based on initial evaluation findings, risk stratification, comorbidities and participant's personal goals.      Expected Outcomes Short Term: Attend rehab on a regular basis to increase amount of physical activity.;Long Term: Exercising regularly at least 3-5 days a week.;Long Term: Add in home exercise to make exercise part of routine and to increase amount of physical activity. Short Term: Attend rehab on a regular basis to increase amount of physical activity.;Long Term: Exercising regularly at least 3-5 days a week.;Long Term: Add in home exercise to make exercise part of routine and to increase amount of physical activity.      Increase Strength and Stamina Yes Yes      Intervention Provide advice, education, support and counseling about physical activity/exercise needs.;Develop an individualized exercise prescription for aerobic and resistive training based on initial evaluation findings, risk stratification, comorbidities and participant's personal goals. Provide advice, education, support and counseling about physical activity/exercise needs.;Develop an individualized exercise prescription for aerobic and resistive training based on initial evaluation findings, risk stratification, comorbidities and participant's personal goals.      Expected Outcomes Short Term: Increase workloads from initial exercise prescription for resistance, speed, and METs.;Short Term: Perform resistance training exercises routinely during rehab and add in resistance training at home;Long Term: Improve cardiorespiratory fitness, muscular endurance and strength as measured by increased METs and functional  capacity (6MWT) Short Term: Increase workloads from initial exercise prescription for resistance, speed, and METs.;Short Term: Perform resistance training exercises routinely during rehab and add in resistance training at home;Long Term: Improve cardiorespiratory fitness, muscular endurance and strength as measured by increased METs and functional capacity (6MWT)      Able to understand and use rate of perceived exertion (RPE) scale Yes Yes      Intervention Provide education and explanation on how to use RPE scale Provide education and explanation on how to use RPE scale  Expected Outcomes Short Term: Able to use RPE daily in rehab to express subjective intensity level;Long Term:  Able to use RPE to guide intensity level when exercising independently Short Term: Able to use RPE daily in rehab to express subjective intensity level;Long Term:  Able to use RPE to guide intensity level when exercising independently      Able to understand and use Dyspnea scale Yes Yes      Intervention Provide education and explanation on how to use Dyspnea scale Provide education and explanation on how to use Dyspnea scale      Expected Outcomes Short Term: Able to use Dyspnea scale daily in rehab to express subjective sense of shortness of breath during exertion;Long Term: Able to use Dyspnea scale to guide intensity level when exercising independently Short Term: Able to use Dyspnea scale daily in rehab to express subjective sense of shortness of breath during exertion;Long Term: Able to use Dyspnea scale to guide intensity level when exercising independently      Knowledge and understanding of Target Heart Rate Range (THRR) Yes Yes      Intervention Provide education and explanation of THRR including how the numbers were predicted and where they are located for reference Provide education and explanation of THRR including how the numbers were predicted and where they are located for reference      Expected Outcomes  Short Term: Able to state/look up THRR;Long Term: Able to use THRR to govern intensity when exercising independently;Short Term: Able to use daily as guideline for intensity in rehab Short Term: Able to state/look up THRR;Long Term: Able to use THRR to govern intensity when exercising independently;Short Term: Able to use daily as guideline for intensity in rehab      Understanding of Exercise Prescription Yes Yes      Intervention Provide education, explanation, and written materials on patient's individual exercise prescription Provide education, explanation, and written materials on patient's individual exercise prescription      Expected Outcomes Short Term: Able to explain program exercise prescription;Long Term: Able to explain home exercise prescription to exercise independently Short Term: Able to explain program exercise prescription;Long Term: Able to explain home exercise prescription to exercise independently               Exercise Goals Re-Evaluation :  Exercise Goals Re-Evaluation     Row Name 07/24/22 0842             Exercise Goal Re-Evaluation   Exercise Goals Review Increase Physical Activity;Able to understand and use Dyspnea scale;Understanding of Exercise Prescription;Increase Strength and Stamina;Knowledge and understanding of Target Heart Rate Range (THRR);Able to understand and use rate of perceived exertion (RPE) scale       Comments Pt has completed three exercise sessions. She missed one session for weather. She exercised for 15 min on the recumbent bike and recumbent elliptical. Mckinzi is averaging 1.5 METs at level 1 on the recumbent elliptical and 1.4 METs at level 1 on the recumbent elliptical. Scottlynn performed the warmup and cooldown seated/ standing dependent on her shortness of breath and hypoxia. She has not began progressing but enjoying exercise. Will begin to have pt ambulate soon to prepare for lung transplant       Expected Outcomes Through exercise at  rehab and home, the patient will decrease shortness of breath with daily activities and feel confident in carrying out an exercise regimen at home.  Discharge Exercise Prescription (Final Exercise Prescription Changes):  Exercise Prescription Changes - 07/10/22 1600       Response to Exercise   Blood Pressure (Admit) 130/72    Blood Pressure (Exercise) 110/70    Blood Pressure (Exit) 102/70    Heart Rate (Admit) 131 bpm    Heart Rate (Exercise) 158 bpm    Heart Rate (Exit) 140 bpm    Oxygen Saturation (Admit) 96 %    Oxygen Saturation (Exercise) 96 %    Oxygen Saturation (Exit) 94 %    Rating of Perceived Exertion (Exercise) 13    Perceived Dyspnea (Exercise) 2    Duration Progress to 30 minutes of  aerobic without signs/symptoms of physical distress    Intensity THRR unchanged      Progression   Progression Continue to progress workloads to maintain intensity without signs/symptoms of physical distress.      Resistance Training   Training Prescription Yes    Weight red bands    Reps 10-15    Time 10 Minutes      Oxygen   Oxygen Continuous    Liters 15      Recumbant Bike   Level 1    RPM 60    Minutes 15    METs 1.4      Recumbant Elliptical   Level 1    Minutes 15    METs 1.4      Oxygen   Maintain Oxygen Saturation 88% or higher             Nutrition:  Target Goals: Understanding of nutrition guidelines, daily intake of sodium <1549m, cholesterol <2066m calories 30% from fat and 7% or less from saturated fats, daily to have 5 or more servings of fruits and vegetables.  Biometrics:  Pre Biometrics - 07/04/22 1515       Pre Biometrics   Grip Strength 22 kg              Nutrition Therapy Plan and Nutrition Goals:  Nutrition Therapy & Goals - 07/10/22 1552       Nutrition Therapy   Diet General Healthy Diet      Personal Nutrition Goals   Nutrition Goal Patient to identify strategies for weight loss of 0.5-2.0# per  week of weight loss    Personal Goal #2 Patient to identify food sources and limit daily intake of saturated fat, sodium, refined carbohydrates, and trans fats.    Comments Carrin is motivated to lose weight in preparation for lung transplant. [facility] with [facility]. She reports better control of hunger since starting this medication. She reports drinking up to 2 protein drinks daily as snacks throughout the day. She reports trying to increase fruits and vegetable intake to aid with weight loss. She lives at home with her mom, JaMarcie Balher sister and niece (3). Her mom does the majority of the grocery shopping and cooking.      Intervention Plan   Intervention Prescribe, educate and counsel regarding individualized specific dietary modifications aiming towards targeted core components such as weight, hypertension, lipid management, diabetes, heart failure and other comorbidities.;Nutrition handout(s) given to patient.    Expected Outcomes Short Term Goal: Understand basic principles of dietary content, such as calories, fat, sodium, cholesterol and nutrients.;Long Term Goal: Adherence to prescribed nutrition plan.             Nutrition Assessments:  MEDIFICTS Score Key: ?70 Need to make  dietary changes  40-70 Heart Healthy Diet ? 40 Therapeutic Level Cholesterol Diet  Flowsheet Row Pulmonary Rehab from 11/13/2021 in [facility]  Picture Your Plate Total Score on Admission 69      Picture Your Plate Scores: <76 Unhealthy dietary pattern with much room for improvement. 41-50 Dietary pattern unlikely to meet recommendations for good health and room for improvement. 51-60 More healthful dietary pattern, with some room for improvement.  >60 Healthy dietary pattern, although there may be some specific behaviors that could be improved.    Nutrition Goals Re-Evaluation:  Nutrition Goals Re-Evaluation     [place] Name  07/10/22 1552             Goals   Current Weight 212 lb 15.4 oz (96.6 kg)       Comment A1c 5.7       Expected Outcome Yailyn is motivated to lose weight in preparation for lung transplant. She recently saw [facility] and started Ozempic to aid with weight loss. She reports better control of hunger since starting this medication. She reports drinking up to 2 protein drinks daily as snacks throughout the day. She reports trying to increase fruits and vegetable intake to aid with weight loss. She lives at home with her mom, Marcie Bal, her sister and niece (3). Her mom does the majority of the grocery shopping and cooking. Shadie will benefit in participation in pulmonary rehab for nutrition education, exercise, and lifestyle modification.                Nutrition Goals Discharge (Final Nutrition Goals Re-Evaluation):  Nutrition Goals Re-Evaluation - 07/10/22 1552       Goals   Current Weight 212 lb 15.4 oz (96.6 kg)    Comment A1c 5.7    Expected Outcome Latamara is motivated to lose weight in preparation for lung transplant. She recently saw [facility] and started Ozempic to aid with weight loss. She reports better control of hunger since starting this medication. She reports drinking up to 2 protein drinks daily as snacks throughout the day. She reports trying to increase fruits and vegetable intake to aid with weight loss. She lives at home with her mom, Marcie Bal, her sister and niece (3). Her mom does the majority of the grocery shopping and cooking. Taliana will benefit in participation in pulmonary rehab for nutrition education, exercise, and lifestyle modification.             Psychosocial: Target Goals: Acknowledge presence or absence of significant depression and/or stress, maximize coping skills, provide positive support system. Participant is able to verbalize types and ability to use techniques and skills needed for reducing stress and depression.  Initial Review &  Psychosocial Screening:  Initial Psych Review & Screening - 07/04/22 1345       Initial Review   Current issues with None Identified      Family Dynamics   Good Support System? Yes    Comments mom, sister, aunts, uncles      Barriers   Psychosocial barriers to participate in program The patient should benefit from training in stress management and relaxation.      Screening Interventions   Interventions Encouraged to exercise    Expected Outcomes Short Term goal: Utilizing psychosocial counselor, staff and physician to assist with identification of specific Stressors or current issues interfering with healing process. Setting desired goal for each stressor or current issue identified.;Long Term Goal: Stressors or current issues are controlled or  eliminated.;Long Term goal: The participant improves quality of Life and PHQ9 Scores as seen by post scores and/or verbalization of changes;Short Term goal: Identification and review with participant of any Quality of Life or Depression concerns found by scoring the questionnaire.             Quality of Life Scores:  Scores of 19 and below usually indicate a poorer quality of life in these areas.  A difference of  2-3 points is a clinically meaningful difference.  A difference of 2-3 points in the total score of the Quality of Life Index has been associated with significant improvement in overall quality of life, self-image, physical symptoms, and general health in studies assessing change in quality of life.  PHQ-9: Review Flowsheet  More data exists      07/04/2022 11/13/2021 05/04/2021 06/16/2020 08/17/2019  Depression screen PHQ 2/9  Decreased Interest 1 1 0 1 0  Down, Depressed, Hopeless _0 0 0  PHQ - 2 Score _1 0  Altered sleeping 0 1 1 - -  Tired, decreased energy _2 - -  Change in appetite _3 - -  Feeling bad or failure about yourself  0 0 0 - -  Trouble concentrating 0 0 0 - -  Moving slowly or fidgety/restless 0 0  0 - -  Suicidal thoughts 0 0 0 - -  PHQ-9 Score _4 - -  Difficult doing work/chores Somewhat difficult Not difficult at all Somewhat difficult - -   Interpretation of Total Score  Total Score Depression Severity:  1-4 = Minimal depression, 5-9 = Mild depression, 10-14 = Moderate depression, 15-19 = Moderately severe depression, 20-27 = Severe depression   Psychosocial Evaluation and Intervention:  Psychosocial Evaluation - 07/04/22 1347       Psychosocial Evaluation & Interventions   Interventions Encouraged to exercise with the program and follow exercise prescription    Comments Rashaun feels supported and mentally stable given her health concerns. She is motivated to exercise and participate in class.    Expected Outcomes For pt to do PR.    Continue Psychosocial Services  No Follow up required             Psychosocial Re-Evaluation:  Psychosocial Re-Evaluation     [place] Name 07/25/22 1532             Psychosocial Re-Evaluation   Current issues with Current Stress Concerns       Comments Kelin has completed 3 PR sessions so far. She is really working on improving her endurance and stamina so she can perform well on her work up for a possible lung transplant. Weslynn has really been focusing on her 6-minute walk test that she needs to complete at [facility]. Errica reports a weeklong appointments, tests, screenings, and procedures to see if she is a transplant candidate. This has been a source of stress and anxiety for her. She otherwise reports that her mental health is stable and has constant support and caregivers from her mom, aunts, uncles, and cousins. Meghann states that she will start seeing a therapist at [facility] from the transplant team. She declines any referrals at the moment. We will continue to follow, assess, and monitor Johnnae for any needs that may arise.       Expected Outcomes For Leverne to be able to participate in PR without any psychosocial barriers or  concerns.       Interventions Stress management education;Encouraged  to attend Pulmonary Rehabilitation for the exercise       Continue Psychosocial Services  Follow up required by staff         Initial Review   Source of Stress Concerns Chronic Illness  Initial stages of work up for possible lung transplant       Comments Disease progression and possible lung transplant                Psychosocial Discharge (Final Psychosocial Re-Evaluation):  Psychosocial Re-Evaluation - 07/25/22 1532       Psychosocial Re-Evaluation   Current issues with Current Stress Concerns    Comments Ruey has completed 3 PR sessions so far. She is really working on improving her endurance and stamina so she can perform well on her work up for a possible lung transplant. Alyla has really been focusing on her 6-minute walk test that she needs to complete at [facility]. Monzerrath reports a weeklong appointments, tests, screenings, and procedures to see if she is a transplant candidate. This has been a source of stress and anxiety for her. She otherwise reports that her mental health is stable and has constant support and caregivers from her mom, aunts, uncles, and cousins. Wenda states that she will start seeing a therapist at [facility] from the transplant team. She declines any referrals at the moment. We will continue to follow, assess, and monitor Kynleigh for any needs that may arise.    Expected Outcomes For Tu to be able to participate in PR without any psychosocial barriers or concerns.    Interventions Stress management education;Encouraged to attend Pulmonary Rehabilitation for the exercise    Continue Psychosocial Services  Follow up required by staff      Initial Review   Source of Stress Concerns Chronic Illness   Initial stages of work up for possible lung transplant   Comments Disease progression and possible lung transplant             Education: Education Goals: Education classes will be  provided on a weekly basis, covering required topics. Participant will state understanding/return demonstration of topics presented.  Learning Barriers/Preferences:  Learning Barriers/Preferences - 07/04/22 1348       Learning Barriers/Preferences   Learning Barriers None    Learning Preferences Skilled Demonstration;Video;Individual Instruction;Audio             Education Topics: Introduction to Pulmonary Rehab Group instruction provided by PowerPoint, verbal discussion, and written material to support subject matter. Instructor reviews what Pulmonary Rehab is, the purpose of the program, and how patients are referred.     Know Your Numbers Group instruction that is supported by a PowerPoint presentation. Instructor discusses importance of knowing and understanding resting, exercise, and post-exercise oxygen saturation, heart rate, and blood pressure. Oxygen saturation, heart rate, blood pressure, rating of perceived exertion, and dyspnea are reviewed along with a normal range for these values.    Exercise for the Pulmonary Patient Group instruction that is supported by a PowerPoint presentation. Instructor discusses benefits of exercise, core components of exercise, frequency, duration, and intensity of an exercise routine, importance of utilizing pulse oximetry during exercise, safety while exercising, and options of places to exercise outside of rehab.       MET Level  Group instruction provided by PowerPoint, verbal discussion, and written material to support subject matter. Instructor reviews what METs are and how to increase METs.    Pulmonary Medications Verbally interactive group education provided by instructor with focus on inhaled medications and  proper administration. Flowsheet Row PULMONARY REHAB OTHER RESPIRATORY from 07/19/2022 in [facility] [place]  Date 07/19/22  Educator EP  Instruction Review Code 1-  Verbalizes Understanding       Anatomy and Physiology of the Respiratory System Group instruction provided by PowerPoint, verbal discussion, and written material to support subject matter. Instructor reviews respiratory cycle and anatomical components of the respiratory system and their functions. Instructor also reviews differences in obstructive and restrictive respiratory diseases with examples of each.    Oxygen Safety Group instruction provided by PowerPoint, verbal discussion, and written material to support subject matter. There is an overview of "What is Oxygen" and "Why do we need it".  Instructor also reviews how to create a safe environment for oxygen use, the importance of using oxygen as prescribed, and the risks of noncompliance. There is a brief discussion on traveling with oxygen and resources the patient may utilize.   Oxygen Use Group instruction provided by PowerPoint, verbal discussion, and written material to discuss how supplemental oxygen is prescribed and different types of oxygen supply systems. Resources for more information are provided.    Breathing Techniques Group instruction that is supported by demonstration and informational handouts. Instructor discusses the benefits of pursed lip and diaphragmatic breathing and detailed demonstration on how to perform both.     Risk Factor Reduction Group instruction that is supported by a PowerPoint presentation. Instructor discusses the definition of a risk factor, different risk factors for pulmonary disease, and how the heart and lungs work together.   MD Day A group question and answer session with a medical doctor that allows participants to ask questions that relate to their pulmonary disease state.   Nutrition for the Pulmonary Patient Group instruction provided by PowerPoint slides, verbal discussion, and written materials to support subject matter. The instructor gives an explanation and review of healthy  diet recommendations, which includes a discussion on weight management, recommendations for fruit and vegetable consumption, as well as protein, fluid, caffeine, fiber, sodium, sugar, and alcohol. Tips for eating when patients are short of breath are discussed.    Other Education Group or individual verbal, written, or video instructions that support the educational goals of the pulmonary rehab program.    Knowledge Questionnaire Score:  Knowledge Questionnaire Score - 07/04/22 1348       Knowledge Questionnaire Score   Pre Score 17/18             Core Components/Risk Factors/Patient Goals at Admission:  Personal Goals and Risk Factors at Admission - 07/04/22 1355       Core Components/Risk Factors/Patient Goals on Admission    Weight Management Weight Loss    Improve shortness of breath with ADL's Yes    Intervention Provide education, individualized exercise plan and daily activity instruction to help decrease symptoms of SOB with activities of daily living.    Expected Outcomes Short Term: Improve cardiorespiratory fitness to achieve a reduction of symptoms when performing ADLs;Long Term: Be able to perform more ADLs without symptoms or delay the onset of symptoms    Personal Goal Other Yes    Personal Goal build muscle mass    Intervention resistance bands in PR and at home    Expected Outcomes increase grip strength             Core Components/Risk Factors/Patient Goals Review:   Goals and Risk Factor Review     Row Name 07/25/22 1541  Core Components/Risk Factors/Patient Goals Review   Personal Goals Review Weight Management/Obesity;Improve shortness of breath with ADL's;Develop more efficient breathing techniques such as purse lipped breathing and diaphragmatic breathing and practicing self-pacing with activity.       Review Nazareth has completed 3 PR sessions so far. She is really working on improving her endurance and stamina so she can perform  well on her work up for a possible lung transplant. Currently, she is exercising on the recumbent bike and the recumbent elliptical, each for 34mn. She is deconditioned and requires 15L of oxygen to maintain her oxygen saturation. Bethanee is also working to lose weight. There has been little change in weight, but she stated her MD started her on semaglutide medication and she is working with our dietitian. After 3 sessions, we cannot adequately calculate if Arali is making any exercise progress, but we will continue to monitor. She is practicing pursed lip breathing when he gets short of breath and can verbalize her rate of perceived exertion and dyspnea scale to staff. Pleshette has attended 1 education class on anatomy and physiology of the lungs. Adaliah states that she enjoys coming to class and working towards her goal of new lungs.       Expected Outcomes See admission goals                Core Components/Risk Factors/Patient Goals at Discharge (Final Review):   Goals and Risk Factor Review - 07/25/22 1541       Core Components/Risk Factors/Patient Goals Review   Personal Goals Review Weight Management/Obesity;Improve shortness of breath with ADL's;Develop more efficient breathing techniques such as purse lipped breathing and diaphragmatic breathing and practicing self-pacing with activity.    Review Pamalee has completed 3 PR sessions so far. She is really working on improving her endurance and stamina so she can perform well on her work up for a possible lung transplant. Currently, she is exercising on the recumbent bike and the recumbent elliptical, each for 128m. She is deconditioned and requires 15L of oxygen to maintain her oxygen saturation. Nyah is also working to lose weight. There has been little change in weight, but she stated her MD started her on semaglutide medication and she is working with our dietitian. After 3 sessions, we cannot adequately calculate if Lorelee is making  any exercise progress, but we will continue to monitor. She is practicing pursed lip breathing when he gets short of breath and can verbalize her rate of perceived exertion and dyspnea scale to staff. Kailynne has attended 1 education class on anatomy and physiology of the lungs. Doyce states that she enjoys coming to class and working towards her goal of new lungs.    Expected Outcomes See admission goals             ITP Comments:   Comments: Pt is making expected progress toward Pulmonary Rehab goals after completing 3 sessions. Recommend continued exercise, life style modification, education, and utilization of breathing techniques to increase stamina and strength, while also decreasing shortness of breath with exertion.  Dr. JaRodman Pickles Medical Director for Pulmonary Rehab at [facility]

## 2022-07-26 ENCOUNTER — Encounter (HOSPITAL_COMMUNITY): Payer: Medicaid Other

## 2022-07-26 ENCOUNTER — Ambulatory Visit (HOSPITAL_COMMUNITY): Payer: Medicaid Other

## 2022-07-26 ENCOUNTER — Telehealth (HOSPITAL_COMMUNITY): Payer: Self-pay | Admitting: *Deleted

## 2022-07-26 NOTE — Telephone Encounter (Signed)
Rhonda Franco is dealing with a stomach virus. She sts she has not been able to eat solid food yet, last diarrhea was yesterday morning. Encouraged fluids, BRAT diet, and not exercising today. Will cancel her appointment in [place] BS, ACSM-CEP 07/26/2022 8:45 AM

## 2022-07-30 ENCOUNTER — Ambulatory Visit (INDEPENDENT_AMBULATORY_CARE_PROVIDER_SITE_OTHER): Payer: Medicaid Other

## 2022-07-30 ENCOUNTER — Ambulatory Visit
Admission: RE | Admit: 2022-07-30 | Discharge: 2022-07-30 | Disposition: A | Discharge status: Home / Self Care | Payer: Medicaid Other | Source: Ambulatory Visit | Attending: Internal Medicine | Admitting: Internal Medicine

## 2022-07-30 VITALS — BP 112/79 | HR 125 | Temp 98.2°F | Resp 22

## 2022-07-30 DIAGNOSIS — R059 Cough, unspecified: Secondary | ICD-10-CM

## 2022-07-30 DIAGNOSIS — J069 Acute upper respiratory infection, unspecified: Secondary | ICD-10-CM | POA: Diagnosis not present

## 2022-07-30 DIAGNOSIS — Z20822 Contact with and (suspected) exposure to covid-19: Secondary | ICD-10-CM | POA: Diagnosis not present

## 2022-07-30 LAB — POCT INFLUENZA A/B
Influenza A, POC: NEGATIVE
Influenza B, POC: NEGATIVE

## 2022-07-30 NOTE — Discharge Instructions (Signed)
Go to the emergency department as soon as you leave urgent care for further evaluation and management. 

## 2022-07-30 NOTE — ED Notes (Signed)
Patient is being discharged from the Urgent Care and sent to the Emergency Department via self . Per haley, patient is in need of higher level of care due to cough. Patient is aware and verbalizes understanding of plan of care.  Vitals:   07/30/22 1519  BP: 112/79  Pulse: (!) 125  Resp: (!) 22  Temp: 98.2 F (36.8 C)  SpO2: 100%

## 2022-07-30 NOTE — ED Provider Notes (Signed)
[facility]    CSN: [id number] Arrival date & time: 07/30/22  1447      History   Chief Complaint Chief Complaint  Patient presents with   Cough    I have chest congestion & nasal running with congestion and cough with mucus. - Entered by patient    HPI Rhonda Franco is a 22 y.o. female.   Patient presents with cough and nasal congestion that started about 4 days ago.  Patient reports cough is productive.  Denies any known fevers or sick contacts.  Patient has history of interstitial lung disease due to lupus.  She is currently starting the process of getting onto the lung transplant list.  She states that she uses approximately 8 to 15 L/day according to if she is up moving around.  She has not had increased oxygen intake since symptoms started.  She denies any increase in shortness of breath from baseline.  Patient reports that she is monitoring her oxygen at home and it has been ranging from 92 to 94% which is normal for her.  She denies any associated chest pain, sore throat, nausea, vomiting, diarrhea, abdominal pain.   Cough   Past Medical History:  Diagnosis Date   Anemia    Class 2 obesity 03/14/2022   Lupus ([place])    Prediabetes    Pulmonary hypertension ([place]) 10/21/2021   Formatting of this note might be different from the original. 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  [place] Data: - PFT 10/19/21: FVC 31% pred, FEV1 33% pred, FEV1/FVC 94%, DLCO 34% pred. Consistent with severe restriction, no obstruction and severe decrease in DLCO. Normal DLCO/FVC ratio. - TTE 11/23/20: Normal LV size with mildly reduced function (LVEF 50%). Normal   Sleep apnea 08/25/2008   Formatting of this note might be different from the original. ICD10 Conversion    Patient Active Problem List   Diagnosis Date Noted   RSV (respiratory syncytial virus pneumonia)    PAH (pulmonary artery hypertension) ([place])    Acute on chronic respiratory failure  with hypercapnia ([place]) 05/07/2022   Hypokalemia 03/19/2022   Chronic right heart failure ([place]) 03/19/2022   Hypoalbuminemia 03/15/2022   Acute on chronic respiratory failure with hypoxia ([place]) 03/14/2022   Obesity (BMI 30-39.9) 03/14/2022   Pulmonary hypertension ([place]) 10/21/2021   Acute right ventricular heart failure ([place]) 10/20/2021   Dysplasia of cervix, 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   Systemic lupus erythematosus with lung involvement ([place]) 12/16/2020   Atypical chest pain 09/15/2020   COVID-19 vaccine series completed 06/16/2020   Elevated blood pressure reading without diagnosis of hypertension 06/16/2020   Class 3 severe obesity due to excess calories without serious comorbidity with body mass index (BMI) of 40.0 to 44.9 in adult ([employer]) 08/17/2019   ILD (interstitial lung disease) ([place]) 05/18/2019   Myositis 02/10/2019   BMI 40.0-44.9, adult ([place]) 06/18/2018   Vaginal discharge 06/18/2018   High risk heterosexual behavior 06/18/2018   High risk social situation 04/27/2017   Systemic lupus erythematosus, unspecified ([place]) 03/17/2017   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 No pertinent surgical history.  OB History     Gravida  0   Para  0   Term  0  Preterm  0   AB  0   Living  0      SAB  0   IAB  0   Ectopic  0   Multiple  0   Live Births  0            Home Medications    Prior to Admission medications   Medication Sig Start Date End Date Taking? Authorizing Provider  OZEMPIC, 0.25 OR 0.5 MG/DOSE, 2 MG/3ML SOPN 0.25 mg as directed Subcutaneous once weekly for 30 days 06/26/22  Yes [provider]  acetaZOLAMIDE (DIAMOX) 250 MG tablet Take 250 mg by mouth 2 (two) times daily.     [provider]  aspirin EC 81 MG tablet Take 81 mg by mouth daily.    [provider]  atovaquone (MEPRON) 750 MG/5ML suspension Take 10 mLs by mouth in the morning. 05/02/21   [provider]  ergocalciferol (VITAMIN D2) 1.25 MG (50000 UT) capsule Take 50,000 Units by mouth once a week.    [provider]  famotidine (PEPCID) 20 MG tablet Take 20 mg by mouth 2 (two) times daily. 05/18/21   [provider]  hydroxychloroquine (PLAQUENIL) 200 MG tablet Take 1 tablet (200 mg total) by mouth 2 (two) times daily. 01/18/18   Charlann Lange, PA-C  Iron, Ferrous Sulfate, 325 (65 Fe) MG TABS 1 tablet Orally daily    [provider]  mycophenolate (MYFORTIC) 360 MG TBEC EC tablet Take 720 mg by mouth every 12 (twelve) hours. 02/23/22 02/23/23  [provider]  potassium chloride SA (KLOR-CON M) 20 MEQ tablet Take 20 mEq by mouth 3 (three) times daily. 03/01/22   [provider]  predniSONE (DELTASONE) 20 MG tablet Take 2 tabs until 05/18/22 then 1 tab daily x 7 days Patient taking differently: Take 7.5 mg by mouth daily with breakfast. 7.5 mg taper 05/12/22   Antonieta Pert, MD  torsemide (DEMADEX) 20 MG tablet Take 20 mg by mouth 2 (two) times daily.    [provider]  Treprostinil (TYVASO IN) Inhale 8 puffs into the lungs 4 (four) times daily.    [provider]    Family History Family History  Problem Relation Age of Onset   Diabetes Mother    Arthritis Mother    Diabetes Maternal Aunt    Diabetes Maternal Grandfather    Heart disease Maternal Grandfather    Stroke Paternal Grandmother     Social History Social History   Tobacco Use   Smoking status: Never   Smokeless tobacco: Never  Vaping Use   Vaping Use: Never used  Substance Use Topics   Alcohol use: No   Drug use: No     Allergies   Ampicillin, Keflex [cephalexin], Penicillins, Bactrim [sulfamethoxazole-trimethoprim], and Levaquin  [levofloxacin]   Review of Systems Review of Systems Per HPI  Physical Exam Triage Vital Signs ED Triage Vitals  Enc Vitals Group     BP 07/30/22 1519 112/79     Pulse Rate 07/30/22 1519 (!) 125     Resp 07/30/22 1519 (!) 22     Temp 07/30/22 1519 98.2 F (36.8 C)     Temp Source 07/30/22 1519 Oral     SpO2 07/30/22 1519 100 %     Weight --      Height --      Head Circumference --      Peak Flow --      Pain Score 07/30/22 1516 0  Pain Loc --      Pain Edu? --      Excl. in [place]? --    No data found.  Updated Vital Signs BP 112/79 (BP Location: Left Arm)   Pulse (!) 125   Temp 98.2 F (36.8 C) (Oral)   Resp (!) 22   SpO2 100%   Visual Acuity Right Eye Distance:   Left Eye Distance:   Bilateral Distance:    Right Eye Near:   Left Eye Near:    Bilateral Near:     Physical Exam Constitutional:      General: She is not in acute distress.    Appearance: Normal appearance. She is not toxic-appearing or diaphoretic.  HENT:     Head: Normocephalic and atraumatic.     Right Ear: Tympanic membrane and ear canal normal.     Left Ear: Tympanic membrane and ear canal normal.     Nose: Congestion present.     Mouth/Throat:     Mouth: Mucous membranes are moist.     Pharynx: No posterior oropharyngeal erythema.  Eyes:     Extraocular Movements: Extraocular movements intact.     Conjunctiva/sclera: Conjunctivae normal.     Pupils: Pupils are equal, round, and reactive to light.  Cardiovascular:     Rate and Rhythm: Normal rate and regular rhythm.     Pulses: Normal pulses.     Heart sounds: Normal heart sounds.  Pulmonary:     Effort: Pulmonary effort is normal. No respiratory distress.     Breath sounds: Normal breath sounds. No stridor. No wheezing, rhonchi or rales.     Comments: Crackles bilaterally which is consistent with interstitial lung disease. Abdominal:     General: Abdomen is flat. Bowel sounds are normal.     Palpations: Abdomen is soft.   Musculoskeletal:        General: Normal range of motion.     Cervical back: Normal range of motion.  Skin:    General: Skin is warm and dry.  Neurological:     General: No focal deficit present.     Mental Status: She is alert and oriented to person, place, and time. Mental status is at baseline.  Psychiatric:        Mood and Affect: Mood normal.        Behavior: Behavior normal.      UC Treatments / Results  Labs (all labs ordered are listed, but only abnormal results are displayed) Labs Reviewed  SARS CORONAVIRUS 2 (TAT 6-24 HRS)  POCT INFLUENZA A/B    EKG   Radiology DG Chest 2 View  Result Date: 07/30/2022 CLINICAL DATA:  Cough EXAM: CHEST - 2 VIEW COMPARISON:  05/06/2022, CT chest 04/23/2018, chest x-ray 05/05/2018, 03/14/2022 FINDINGS: Coarse diffuse bilateral reticular opacity suspicious for underlying fibrosis/chronic lung disease. Heterogeneous areas of bilateral ground-glass opacity. No pleural effusion. Normal cardiac size. No pneumothorax. IMPRESSION: Coarse diffuse bilateral reticular opacity suspicious for underlying fibrosis/chronic lung disease. Heterogeneous areas of bilateral ground-glass opacity may be due to superimposed acute inflammatory or infectious process versus progression of underlying chronic lung disease. Electronically Signed   By: Donavan Foil M.D.   On: 07/30/2022 16:00    Procedures Procedures (including critical care time)  Medications Ordered in UC Medications - No data to display  Initial Impression / Assessment and Plan / UC Course  I have reviewed the triage vital signs and the nursing notes.  Pertinent labs & imaging results that were available during my  care of the patient were reviewed by me and considered in my medical decision making (see chart for details).     It appears that patient has a viral illness that is causing inflammation.  Chest x-ray was completed that shows bilateral ground glass opacities which is concerning.   Rapid flu is negative.  Given findings on x-ray and patient's associated interstitial lung disease, recommend ER evaluation and further management.  Patient was agreeable. Discussed with supervising physician Dr. Lanny Cramp who was agreeable.  Patient's vital signs and oxygen are stable.  She is tachycardic at baseline.  Therefore, agree with patient self transport to the hospital.  Her family member present in the urgent care transported her to the hospital.  Rapid flu was negative.  Covid test completed while awaiting x-ray results but given patient is to be evaluated the ER, recommended to patient that she get full respiratory panel which cannot be provided here in urgent care.  She was agreeable to this and advised [place] to discard our COVID test as our covid test is not rapid test. Final Clinical Impressions(s) / UC Diagnoses   Final diagnoses:  Encounter for laboratory testing for COVID-19 virus  Viral upper respiratory tract infection with cough     Discharge Instructions      Go to the emergency department as soon as you leave urgent care for further evaluation and management.     ED Prescriptions   None    PDMP not reviewed this encounter.   Teodora Medici, [place] 07/30/22 1626

## 2022-07-30 NOTE — ED Triage Notes (Signed)
Pt c/o nasal congestion, productive cough more than baseline,   Onset ~ a few days ago maybe 4   Significant medical hx on lung transplant list

## 2022-07-31 ENCOUNTER — Encounter (HOSPITAL_COMMUNITY): Payer: Medicaid Other

## 2022-07-31 ENCOUNTER — Ambulatory Visit (HOSPITAL_COMMUNITY): Payer: Medicaid Other

## 2022-07-31 LAB — SARS CORONAVIRUS 2 (TAT 6-24 HRS): SARS Coronavirus 2: NEGATIVE

## 2022-08-02 ENCOUNTER — Ambulatory Visit (HOSPITAL_COMMUNITY): Payer: Medicaid Other

## 2022-08-02 ENCOUNTER — Encounter (HOSPITAL_COMMUNITY): Payer: Medicaid Other

## 2022-08-07 ENCOUNTER — Encounter (HOSPITAL_COMMUNITY): Payer: Medicaid Other

## 2022-08-07 ENCOUNTER — Ambulatory Visit (HOSPITAL_COMMUNITY): Payer: Medicaid Other

## 2022-08-09 ENCOUNTER — Ambulatory Visit (HOSPITAL_COMMUNITY): Payer: Medicaid Other

## 2022-08-09 ENCOUNTER — Encounter (HOSPITAL_COMMUNITY): Payer: Medicaid Other

## 2022-08-09 ENCOUNTER — Encounter (HOSPITAL_BASED_OUTPATIENT_CLINIC_OR_DEPARTMENT_OTHER): Payer: Medicaid Other | Admitting: Pulmonary Disease

## 2022-08-14 ENCOUNTER — Ambulatory Visit (HOSPITAL_COMMUNITY): Payer: Medicaid Other

## 2022-08-14 ENCOUNTER — Encounter (HOSPITAL_COMMUNITY): Payer: Medicaid Other

## 2022-08-15 ENCOUNTER — Telehealth (HOSPITAL_COMMUNITY): Payer: Self-pay

## 2022-08-15 NOTE — Telephone Encounter (Signed)
Called patient to check up on her after having her pre-transplant week long tests and procedures and to see if she would be attending [facility] tomorrow. LVM on her phone

## 2022-08-16 ENCOUNTER — Encounter (HOSPITAL_COMMUNITY)
Admission: RE | Admit: 2022-08-16 | Discharge: 2022-08-16 | Disposition: A | Discharge status: Home / Self Care | Payer: Medicaid Other | Source: Ambulatory Visit | Attending: Pulmonary Disease | Admitting: Pulmonary Disease

## 2022-08-16 ENCOUNTER — Ambulatory Visit (HOSPITAL_COMMUNITY): Payer: Medicaid Other

## 2022-08-16 VITALS — Wt 211.0 lb

## 2022-08-16 DIAGNOSIS — J849 Interstitial pulmonary disease, unspecified: Secondary | ICD-10-CM | POA: Diagnosis present

## 2022-08-16 DIAGNOSIS — M3213 Lung involvement in systemic lupus erythematosus: Secondary | ICD-10-CM

## 2022-08-16 NOTE — Progress Notes (Signed)
Daily Session Note  Patient Details  Name: Rhonda Franco MRN: [id number] Date of Birth: 12-31-2000 Referring Provider:   April Manson Pulmonary Rehab Walk Test from 07/04/2022 in [facility]  Referring Provider Maceo Pro       Encounter Date: 08/16/2022  Check In:  Session Check In - 08/16/22 1428       Check-In   Supervising physician immediately available to respond to emergencies [facility] MD immediately available    Physician(s) Dr Vaughan Browner    Location [facility]    Staff Present Janine Ores, RN, Quentin Ore, MS, ACSM-CEP, Exercise Physiologist;Samantha [place], RD, LDN;Other;Jetta Gilford Rile BS, ACSM-CEP, Exercise Physiologist    Virtual Visit No    Medication changes reported     No    Fall or balance concerns reported    No    Tobacco Cessation No Change    Warm-up and Cool-down Performed as group-led instruction    Resistance Training Performed Yes    VAD Patient? No    PAD/SET Patient? No      Pain Assessment   Currently in Pain? Yes    Pain Score 4     Pain Location Chest   pt states her chest hurts when her HR increases. She said this was not a new symptom.B/P within normal range   Pain Orientation Mid    Pain Descriptors / Indicators Discomfort;Nagging    Pain Type Chronic pain    Pain Onset Today    Pain Frequency Occasional    Aggravating Factors  when she's exerting herself and HR increases    Pain Relieving Factors deep breathing to bring down HR    Multiple Pain Sites No             Capillary Blood Glucose: No results found for this or any previous visit (from the past 24 hour(s)).    Social History   Tobacco Use  Smoking Status Never  Smokeless Tobacco Never    Goals Met:  Proper associated with RPD/PD & O2 Sat Independence with exercise equipment Exercise tolerated well No report of concerns or symptoms today Strength training completed today  Goals Unmet:  Not  Applicable  Comments: Service time is from 1315 to 1430.    Dr. Rodman Pickle is Medical Director for Pulmonary Rehab at [facility].

## 2022-08-16 NOTE — Progress Notes (Addendum)
Pt came in on 8 L of oxygen. Pt was placed on 100% NRB mask for exercise. O2 was checked after exercise was started and sats were 84%. Pt placed on 10L HFNC along with the NRB mask and sats increased to 90%. Pt placed back on 8 L [place] after class was over and sats were 91%.  Dorene Sorrow, BSRT

## 2022-08-21 ENCOUNTER — Ambulatory Visit (HOSPITAL_COMMUNITY): Payer: Medicaid Other

## 2022-08-21 ENCOUNTER — Telehealth (HOSPITAL_COMMUNITY): Payer: Self-pay

## 2022-08-21 ENCOUNTER — Encounter (HOSPITAL_COMMUNITY): Payer: Medicaid Other

## 2022-08-21 NOTE — Telephone Encounter (Signed)
Called patient to check on her after calling out for Chest Pain from Pulmonary Rehab. LVM on her phone.

## 2022-08-22 NOTE — Progress Notes (Signed)
Pulmonary Individual Treatment Plan  Patient Details  Name: Hoang Tal MRN: [id number] Date of Birth: 2000-12-06 Referring Provider:   April Manson Pulmonary Rehab Walk Test from 07/04/2022 in [facility] [place]  Referring Provider Maceo Pro       Initial Encounter Date:  Flowsheet Row Pulmonary Rehab Walk Test from 07/04/2022 in [facility] [place]  Date 07/04/22       Visit Diagnosis: Lung involvement in systemic lupus erythematosus ([place])  ILD (interstitial lung disease) ([place])  Patient's Home Medications on Admission:   Current Outpatient Medications:    acetaZOLAMIDE (DIAMOX) 250 MG tablet, Take 250 mg by mouth 2 (two) times daily., Disp: , Rfl:    aspirin EC 81 MG tablet, Take 81 mg by mouth daily., Disp: , Rfl:    atovaquone (MEPRON) 750 MG/5ML suspension, Take 10 mLs by mouth in the morning., Disp: , Rfl:    ergocalciferol (VITAMIN D2) 1.25 MG (50000 UT) capsule, Take 50,000 Units by mouth once a week., Disp: , Rfl:    famotidine (PEPCID) 20 MG tablet, Take 20 mg by mouth 2 (two) times daily., Disp: , Rfl:    hydroxychloroquine (PLAQUENIL) 200 MG tablet, Take 1 tablet (200 mg total) by mouth 2 (two) times daily., Disp: 60 tablet, Rfl: 0   Iron, Ferrous Sulfate, 325 (65 Fe) MG TABS, 1 tablet Orally daily, Disp: , Rfl:    mycophenolate (MYFORTIC) 360 MG TBEC EC tablet, Take 720 mg by mouth every 12 (twelve) hours., Disp: , Rfl:    OZEMPIC, 0.25 OR 0.5 MG/DOSE, 2 MG/3ML SOPN, 0.25 mg as directed Subcutaneous once weekly for 30 days, Disp: , Rfl:    potassium chloride SA (KLOR-CON M) 20 MEQ tablet, Take 20 mEq by mouth 3 (three) times daily., Disp: , Rfl:    predniSONE (DELTASONE) 20 MG tablet, Take 2 tabs until 05/18/22 then 1 tab daily x 7 days (Patient taking differently: Take 7.5 mg by mouth daily with breakfast. 7.5 mg taper), Disp: 21 tablet, Rfl: 0   torsemide  (DEMADEX) 20 MG tablet, Take 20 mg by mouth 2 (two) times daily., Disp: , Rfl:    Treprostinil (TYVASO IN), Inhale 8 puffs into the lungs 4 (four) times daily., Disp: , Rfl:   Past Medical History: Past Medical History:  Diagnosis Date   Anemia    Class 2 obesity 03/14/2022   Lupus ([place])    Prediabetes    Pulmonary hypertension ([place]) 10/21/2021   Formatting of this note might be different from the original. 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  [place] Data: - PFT 10/19/21: FVC 31% pred, FEV1 33% pred, FEV1/FVC 94%, DLCO 34% pred. Consistent with severe restriction, no obstruction and severe decrease in DLCO. Normal DLCO/FVC ratio. - TTE 11/23/20: Normal LV size with mildly reduced function (LVEF 50%). Normal   Sleep apnea 08/25/2008   Formatting of this note might be different from the original. ICD10 Conversion    Tobacco Use: Social History   Tobacco Use  Smoking Status Never  Smokeless Tobacco Never    Labs: Review Flowsheet  More data exists      Latest Ref Rng & Units 03/18/2022 03/25/2022 05/06/2022 05/07/2022 05/11/2022  Labs for ITP Cardiac and Pulmonary Rehab  PH, Arterial 7.35 - 7.45 - 7.48  - - 7.47   PCO2 arterial 32 - 48 mmHg - 45  - - 54   Bicarbonate  20.0 - 28.0 mmol/L - 33.5  36.4  34.4  39.3   O2 Saturation % 86.7  95  59.4  98.8  100     Capillary Blood Glucose: Lab Results  Component Value Date   GLUCAP 86 03/26/2022   GLUCAP 208 (H) 03/25/2022   GLUCAP 235 (H) 03/25/2022   GLUCAP 190 (H) 03/25/2022   GLUCAP 168 (H) 03/25/2022     Pulmonary Assessment Scores:  Pulmonary Assessment Scores     Row Name 07/04/22 1351         ADL UCSD   ADL Phase Entry     SOB Score total 74       CAT Score   CAT Score 22       mMRC Score   mMRC Score 4             UCSD: Self-administered rating of dyspnea associated with activities of daily living (ADLs) 6-point scale (0 = "not at all" to 5 = "maximal or unable to do  because of breathlessness")  Scoring Scores range from 0 to 120.  Minimally important difference is 5 units  CAT: CAT can identify the health impairment of COPD patients and is better correlated with disease progression.  CAT has a scoring range of zero to 40. The CAT score is classified into four groups of low (less than 10), medium (10 - 20), high (21-30) and very high (31-40) based on the impact level of disease on health status. A CAT score over 10 suggests significant symptoms.  A worsening CAT score could be explained by an exacerbation, poor medication adherence, poor inhaler technique, or progression of COPD or comorbid conditions.  CAT MCID is 2 points  mMRC: mMRC (Modified Medical Research Council) Dyspnea Scale is used to assess the degree of baseline functional disability in patients of respiratory disease due to dyspnea. No minimal important difference is established. A decrease in score of 1 point or greater is considered a positive change.   Pulmonary Function Assessment:  Pulmonary Function Assessment - 07/04/22 1520       Breath   Bilateral Breath Sounds Decreased    Shortness of Breath Limiting activity             Exercise Target Goals: Exercise Program Goal: Individual exercise prescription set using results from initial 6 min walk test and THRR while considering  patient's activity barriers and safety.   Exercise Prescription Goal: Initial exercise prescription builds to 30-45 minutes a day of aerobic activity, 2-3 days per week.  Home exercise guidelines will be given to patient during program as part of exercise prescription that the participant will acknowledge.  Activity Barriers & Risk Stratification:  Activity Barriers & Cardiac Risk Stratification - 07/04/22 1352       Activity Barriers & Cardiac Risk Stratification   Activity Barriers Balance Concerns;Shortness of Breath;Muscular Weakness;Deconditioning    Cardiac Risk Stratification Moderate              6 Minute Walk:  6 Minute Walk     Row Name 07/04/22 1509         6 Minute Walk   Phase Initial     Distance 420 feet     Walk Time 6 minutes  rest from 2:00-5:20     # of Rest Breaks 1     MPH 0.8     METS 3.86     RPE 15     Perceived Dyspnea  3  VO2 Peak 13.5     Symptoms Yes (comment)     Comments HR up to 169 and SpO2 down to 82 plus significant SOB/exertion therefore rested sitting for 3 min, 20 sec     Resting HR 140 bpm     Resting BP 128/76     Resting Oxygen Saturation  90 %     Exercise Oxygen Saturation  during 6 min walk 77 %  77 on 15L lowest noted     Max Ex. HR 169 bpm     Max Ex. BP 112/80     2 Minute Post BP 126/84       Interval HR   1 Minute HR 140     2 Minute HR 166     3 Minute HR 169     4 Minute HR 163     5 Minute HR 142     6 Minute HR 159     2 Minute Post HR 159     Interval Heart Rate? Yes       Interval Oxygen   Interval Oxygen? Yes     Baseline Oxygen Saturation % 90 %  90 6L     1 Minute Oxygen Saturation % 89 %     1 Minute Liters of Oxygen 15 L     2 Minute Oxygen Saturation % 82 %  began standing rest     2 Minute Liters of Oxygen 15 L     3 Minute Oxygen Saturation % 82 %  sitting rest     3 Minute Liters of Oxygen 15 L     4 Minute Oxygen Saturation % 89 %  continued sitting rest to increase spo2 more     4 Minute Liters of Oxygen 15 L     5 Minute Oxygen Saturation % 97 %  walked last 40 sec     5 Minute Liters of Oxygen 15 L     6 Minute Oxygen Saturation % 90 %     6 Minute Liters of Oxygen 15 L     2 Minute Post Oxygen Saturation % 83 %  up to 91 after 30 more seconds of rest     2 Minute Post Liters of Oxygen 15 L              Oxygen Initial Assessment:  Oxygen Initial Assessment - 07/04/22 1354       Home Oxygen   Home Oxygen Device Home Concentrator;Liquid Oxygen    Sleep Oxygen Prescription BiPAP    Liters per minute 5    Home Exercise Oxygen Prescription Continuous    Liters per  minute 15    Home Resting Oxygen Prescription Continuous    Liters per minute 5    Compliance with Home Oxygen Use Yes      Initial 6 min Walk   Oxygen Used Continuous    Liters per minute 15      Program Oxygen Prescription   Program Oxygen Prescription Continuous    Liters per minute 15    Comments pt easily desats with walking. Will begin with seated exercise and hope to progress to walking as strength builds      Intervention   Short Term Goals To learn and exhibit compliance with exercise, home and travel O2 prescription;To learn and understand importance of maintaining oxygen saturations>88%;To learn and demonstrate proper use of respiratory medications;To learn and understand importance of monitoring SPO2 with pulse oximeter and demonstrate accurate use  of the pulse oximeter.;To learn and demonstrate proper pursed lip breathing techniques or other breathing techniques. ;Other    Long  Term Goals Demonstrates proper use of MDI's;Compliance with respiratory medication;Exhibits proper breathing techniques, such as pursed lip breathing or other method taught during program session;Maintenance of O2 saturations>88%;Verbalizes importance of monitoring SPO2 with pulse oximeter and return demonstration;Exhibits compliance with exercise, home  and travel O2 prescription             Oxygen Re-Evaluation:  Oxygen Re-Evaluation     Row Name 07/24/22 0858 08/21/22 1652           Program Oxygen Prescription   Program Oxygen Prescription Continuous Continuous      Liters per minute 15  nonrebreather 15  nonrebreather and 10 L [place]      FiO2% -- 100      Comments pt easily desats with walking. Will begin with seated exercise and hope to progress to walking as strength builds --        Maish [place];Liquid Oxygen Home Concentrator;Liquid Oxygen      Sleep Oxygen Prescription BiPAP BiPAP      Liters per minute 5 5      Home Exercise Oxygen  Prescription Continuous Continuous      Liters per minute 15 15      Home Resting Oxygen Prescription Continuous Continuous      Liters per minute 5 5      Compliance with Home Oxygen Use Yes Yes        Goals/Expected Outcomes   Short Term Goals To learn and exhibit compliance with exercise, home and travel O2 prescription;To learn and understand importance of maintaining oxygen saturations>88%;To learn and demonstrate proper use of respiratory medications;To learn and understand importance of monitoring SPO2 with pulse oximeter and demonstrate accurate use of the pulse oximeter.;To learn and demonstrate proper pursed lip breathing techniques or other breathing techniques. ;Other To learn and exhibit compliance with exercise, home and travel O2 prescription;To learn and understand importance of maintaining oxygen saturations>88%;To learn and demonstrate proper use of respiratory medications;To learn and understand importance of monitoring SPO2 with pulse oximeter and demonstrate accurate use of the pulse oximeter.;To learn and demonstrate proper pursed lip breathing techniques or other breathing techniques. ;Other      Long  Term Goals Demonstrates proper use of MDI's;Compliance with respiratory medication;Exhibits proper breathing techniques, such as pursed lip breathing or other method taught during program session;Maintenance of O2 saturations>88%;Verbalizes importance of monitoring SPO2 with pulse oximeter and return demonstration;Exhibits compliance with exercise, home  and travel O2 prescription Demonstrates proper use of MDI's;Compliance with respiratory medication;Exhibits proper breathing techniques, such as pursed lip breathing or other method taught during program session;Maintenance of O2 saturations>88%;Verbalizes importance of monitoring SPO2 with pulse oximeter and return demonstration;Exhibits compliance with exercise, home  and travel O2 prescription      Comments Pt is now exercising on  the nonrebreather, 100%, 15 L. Her SpO2 is 88-92. Pt is now exercising on the nonrebreather, 100%, 15 L + 10L [place] to maintain SpO2 with exercise.      Goals/Expected Outcomes Compliance and understanding of oxygen saturation monitoring and breathing techniques to decrease shortness of breath. Compliance and understanding of oxygen saturation monitoring and breathing techniques to decrease shortness of breath.               Oxygen Discharge (Final Oxygen Re-Evaluation):  Oxygen Re-Evaluation - 08/21/22 1652  Program Oxygen Prescription   Program Oxygen Prescription Continuous    Liters per minute 15   nonrebreather and 10 L [place]   FiO2% 100      Home Oxygen   Home Oxygen Device Home Concentrator;Liquid Oxygen    Sleep Oxygen Prescription BiPAP    Liters per minute 5    Home Exercise Oxygen Prescription Continuous    Liters per minute 15    Home Resting Oxygen Prescription Continuous    Liters per minute 5    Compliance with Home Oxygen Use Yes      Goals/Expected Outcomes   Short Term Goals To learn and exhibit compliance with exercise, home and travel O2 prescription;To learn and understand importance of maintaining oxygen saturations>88%;To learn and demonstrate proper use of respiratory medications;To learn and understand importance of monitoring SPO2 with pulse oximeter and demonstrate accurate use of the pulse oximeter.;To learn and demonstrate proper pursed lip breathing techniques or other breathing techniques. ;Other    Long  Term Goals Demonstrates proper use of MDI's;Compliance with respiratory medication;Exhibits proper breathing techniques, such as pursed lip breathing or other method taught during program session;Maintenance of O2 saturations>88%;Verbalizes importance of monitoring SPO2 with pulse oximeter and return demonstration;Exhibits compliance with exercise, home  and travel O2 prescription    Comments Pt is now exercising on the nonrebreather, 100%, 15 L + 10L [place]  to maintain SpO2 with exercise.    Goals/Expected Outcomes Compliance and understanding of oxygen saturation monitoring and breathing techniques to decrease shortness of breath.             Initial Exercise Prescription:  Initial Exercise Prescription - 07/04/22 1500       Date of Initial Exercise RX and Referring Provider   Date 07/04/22    Referring Provider Maceo Pro    Expected Discharge Date 09/06/22      Oxygen   Oxygen Continuous    Liters 15    Maintain Oxygen Saturation 88% or higher      Recumbant Bike   Level 1    RPM 60    Minutes 15      Recumbant Elliptical   Level 1    RPM 60    Minutes 15      Prescription Details   Frequency (times per week) 2    Duration Progress to 30 minutes of continuous aerobic without signs/symptoms of physical distress      Intensity   THRR 40-80% of Max Heartrate 79-158    Ratings of Perceived Exertion 11-13    Perceived Dyspnea 0-4      Progression   Progression Continue progressive overload as per policy without signs/symptoms or physical distress.      Resistance Training   Training Prescription Yes    Weight red bands    Reps 10-15             Perform Capillary Blood Glucose checks as needed.  Exercise Prescription Changes:   Exercise Prescription Changes     Row Name 07/10/22 1600 07/19/22 1553 08/21/22 1500         Response to Exercise   Blood Pressure (Admit) 130/72 144/84 98/62     Blood Pressure (Exercise) 110/70 -- --     Blood Pressure (Exit) 102/70 92/68 102/68     Heart Rate (Admit) 131 bpm 127 bpm 118 bpm     Heart Rate (Exercise) 158 bpm 161 bpm 144 bpm     Heart Rate (Exit) 140 bpm 132 bpm 131 bpm  Oxygen Saturation (Admit) 96 % 98 % 98 %     Oxygen Saturation (Exercise) 96 % 88 % 89 %     Oxygen Saturation (Exit) 94 % 99 % 91 %     Rating of Perceived Exertion (Exercise) 13 11 12     $ Perceived Dyspnea (Exercise) 2 2 2     $ Duration Progress to 30 minutes of  aerobic without  signs/symptoms of physical distress Progress to 30 minutes of  aerobic without signs/symptoms of physical distress Progress to 30 minutes of  aerobic without signs/symptoms of physical distress     Intensity THRR unchanged THRR New  79-177 THRR unchanged       Progression   Progression Continue to progress workloads to maintain intensity without signs/symptoms of physical distress. Continue to progress workloads to maintain intensity without signs/symptoms of physical distress. Continue to progress workloads to maintain intensity without signs/symptoms of physical distress.       Resistance Training   Training Prescription Yes Yes Yes     Weight red bands red bands red bands     Reps 10-15 10-15 10-15     Time 10 Minutes 10 Minutes 10 Minutes       Interval Training   Interval Training -- No No       Oxygen   Oxygen Continuous Continuous Continuous     Liters 15 25 15L NRB mask with exertion, 8L [place] at rest       Recumbant Bike   Level 1 1 1     $ RPM 60 60 60     Minutes 15 15 15     $ METs 1.4 1.5 1.3       Recumbant Elliptical   Level 1 1 1     $ Watts -- 16 --     Minutes 15 15 15     $ METs 1.4 1 1.3       Oxygen   Maintain Oxygen Saturation 88% or higher 88% or higher 88% or higher              Exercise Comments:   Exercise Comments     Row Name 07/10/22 1619           Exercise Comments Pt completed first day of exercise. She exercised for 15 min on the recumbent bike and recumbent elliptical. [place] averaged 1.4 METs at level 1 on the recumbent elliptical and 1.4 METs at level 1 on the recumbent elliptical. Perina performed the warmup and cooldown seated/ standing dependent on her shortness of breath. Discussed METs and how to increase METs.                Exercise Goals and Review:   Exercise Goals     Row Name 07/04/22 1353 07/24/22 0842 08/21/22 1645         Exercise Goals   Increase Physical Activity Yes Yes Yes     Intervention Provide advice,  education, support and counseling about physical activity/exercise needs.;Develop an individualized exercise prescription for aerobic and resistive training based on initial evaluation findings, risk stratification, comorbidities and participant's personal goals. Provide advice, education, support and counseling about physical activity/exercise needs.;Develop an individualized exercise prescription for aerobic and resistive training based on initial evaluation findings, risk stratification, comorbidities and participant's personal goals. Provide advice, education, support and counseling about physical activity/exercise needs.;Develop an individualized exercise prescription for aerobic and resistive training based on initial evaluation findings, risk stratification, comorbidities and participant's personal goals.     Expected  Outcomes Short Term: Attend rehab on a regular basis to increase amount of physical activity.;Long Term: Exercising regularly at least 3-5 days a week.;Long Term: Add in home exercise to make exercise part of routine and to increase amount of physical activity. Short Term: Attend rehab on a regular basis to increase amount of physical activity.;Long Term: Exercising regularly at least 3-5 days a week.;Long Term: Add in home exercise to make exercise part of routine and to increase amount of physical activity. Short Term: Attend rehab on a regular basis to increase amount of physical activity.;Long Term: Exercising regularly at least 3-5 days a week.;Long Term: Add in home exercise to make exercise part of routine and to increase amount of physical activity.     Increase Strength and Stamina Yes Yes Yes     Intervention Provide advice, education, support and counseling about physical activity/exercise needs.;Develop an individualized exercise prescription for aerobic and resistive training based on initial evaluation findings, risk stratification, comorbidities and participant's personal  goals. Provide advice, education, support and counseling about physical activity/exercise needs.;Develop an individualized exercise prescription for aerobic and resistive training based on initial evaluation findings, risk stratification, comorbidities and participant's personal goals. Provide advice, education, support and counseling about physical activity/exercise needs.;Develop an individualized exercise prescription for aerobic and resistive training based on initial evaluation findings, risk stratification, comorbidities and participant's personal goals.     Expected Outcomes Short Term: Increase workloads from initial exercise prescription for resistance, speed, and METs.;Short Term: Perform resistance training exercises routinely during rehab and add in resistance training at home;Long Term: Improve cardiorespiratory fitness, muscular endurance and strength as measured by increased METs and functional capacity (6MWT) Short Term: Increase workloads from initial exercise prescription for resistance, speed, and METs.;Short Term: Perform resistance training exercises routinely during rehab and add in resistance training at home;Long Term: Improve cardiorespiratory fitness, muscular endurance and strength as measured by increased METs and functional capacity (6MWT) Short Term: Increase workloads from initial exercise prescription for resistance, speed, and METs.;Short Term: Perform resistance training exercises routinely during rehab and add in resistance training at home;Long Term: Improve cardiorespiratory fitness, muscular endurance and strength as measured by increased METs and functional capacity (6MWT)     Able to understand and use rate of perceived exertion (RPE) scale Yes Yes Yes     Intervention Provide education and explanation on how to use RPE scale Provide education and explanation on how to use RPE scale Provide education and explanation on how to use RPE scale     Expected Outcomes Short Term:  Able to use RPE daily in rehab to express subjective intensity level;Long Term:  Able to use RPE to guide intensity level when exercising independently Short Term: Able to use RPE daily in rehab to express subjective intensity level;Long Term:  Able to use RPE to guide intensity level when exercising independently Short Term: Able to use RPE daily in rehab to express subjective intensity level;Long Term:  Able to use RPE to guide intensity level when exercising independently     Able to understand and use Dyspnea scale Yes Yes Yes     Intervention Provide education and explanation on how to use Dyspnea scale Provide education and explanation on how to use Dyspnea scale Provide education and explanation on how to use Dyspnea scale     Expected Outcomes Short Term: Able to use Dyspnea scale daily in rehab to express subjective sense of shortness of breath during exertion;Long Term: Able to use Dyspnea scale to guide intensity  level when exercising independently Short Term: Able to use Dyspnea scale daily in rehab to express subjective sense of shortness of breath during exertion;Long Term: Able to use Dyspnea scale to guide intensity level when exercising independently Short Term: Able to use Dyspnea scale daily in rehab to express subjective sense of shortness of breath during exertion;Long Term: Able to use Dyspnea scale to guide intensity level when exercising independently     Knowledge and understanding of Target Heart Rate Range (THRR) Yes Yes Yes     Intervention Provide education and explanation of THRR including how the numbers were predicted and where they are located for reference Provide education and explanation of THRR including how the numbers were predicted and where they are located for reference Provide education and explanation of THRR including how the numbers were predicted and where they are located for reference     Expected Outcomes Short Term: Able to state/look up THRR;Long Term: Able  to use THRR to govern intensity when exercising independently;Short Term: Able to use daily as guideline for intensity in rehab Short Term: Able to state/look up THRR;Long Term: Able to use THRR to govern intensity when exercising independently;Short Term: Able to use daily as guideline for intensity in rehab Short Term: Able to state/look up THRR;Long Term: Able to use THRR to govern intensity when exercising independently;Short Term: Able to use daily as guideline for intensity in rehab     Understanding of Exercise Prescription Yes Yes Yes     Intervention Provide education, explanation, and written materials on patient's individual exercise prescription Provide education, explanation, and written materials on patient's individual exercise prescription Provide education, explanation, and written materials on patient's individual exercise prescription     Expected Outcomes Short Term: Able to explain program exercise prescription;Long Term: Able to explain home exercise prescription to exercise independently Short Term: Able to explain program exercise prescription;Long Term: Able to explain home exercise prescription to exercise independently Short Term: Able to explain program exercise prescription;Long Term: Able to explain home exercise prescription to exercise independently              Exercise Goals Re-Evaluation :  Exercise Goals Re-Evaluation     Row Name 07/24/22 0842 08/21/22 1645           Exercise Goal Re-Evaluation   Exercise Goals Review Increase Physical Activity;Able to understand and use Dyspnea scale;Understanding of Exercise Prescription;Increase Strength and Stamina;Knowledge and understanding of Target Heart Rate Range (THRR);Able to understand and use rate of perceived exertion (RPE) scale Increase Physical Activity;Able to understand and use Dyspnea scale;Understanding of Exercise Prescription;Increase Strength and Stamina;Knowledge and understanding of Target Heart Rate  Range (THRR);Able to understand and use rate of perceived exertion (RPE) scale      Comments Pt has completed three exercise sessions. She missed one session for weather. She exercised for 15 min on the recumbent bike and recumbent elliptical. Darika is averaging 1.5 METs at level 1 on the recumbent elliptical and 1.4 METs at level 1 on the recumbent elliptical. Reighn performed the warmup and cooldown seated/ standing dependent on her shortness of breath and hypoxia. She has not began progressing but enjoying exercise. Will begin to have pt ambulate soon to prepare for lung transplant Pt has completed four exercise sessions. She has missed many exercise sessions and was recently was out of PR for 1 month for illness then testing with [facility] transplant team. She attempted exercising for 15 min on the recumbent bike and recumbent elliptical. This last  session however struggled to maintain SpO2 and was significantly fatigued. Could only exercise 14 min total.  METs for both pieces of equipment were 1.3. She could not stand for warmup or cooldown. Will attempt to progress as able with a goal of increased ambulation.      Expected Outcomes Through exercise at rehab and home, the patient will decrease shortness of breath with daily activities and feel confident in carrying out an exercise regimen at home. Through exercise at rehab and home, the patient will decrease shortness of breath with daily activities and feel confident in carrying out an exercise regimen at home.               Discharge Exercise Prescription (Final Exercise Prescription Changes):  Exercise Prescription Changes - 08/21/22 1500       Response to Exercise   Blood Pressure (Admit) 98/62    Blood Pressure (Exit) 102/68    Heart Rate (Admit) 118 bpm    Heart Rate (Exercise) 144 bpm    Heart Rate (Exit) 131 bpm    Oxygen Saturation (Admit) 98 %    Oxygen Saturation (Exercise) 89 %    Oxygen Saturation (Exit) 91 %    Rating of  Perceived Exertion (Exercise) 12    Perceived Dyspnea (Exercise) 2    Duration Progress to 30 minutes of  aerobic without signs/symptoms of physical distress    Intensity THRR unchanged      Progression   Progression Continue to progress workloads to maintain intensity without signs/symptoms of physical distress.      Resistance Training   Training Prescription Yes    Weight red bands    Reps 10-15    Time 10 Minutes      Interval Training   Interval Training No      Oxygen   Oxygen Continuous    Liters 15L NRB mask with exertion, 8L [place] at rest      Recumbant Bike   Level 1    RPM 60    Minutes 15    METs 1.3      Recumbant Elliptical   Level 1    Minutes 15    METs 1.3      Oxygen   Maintain Oxygen Saturation 88% or higher             Nutrition:  Target Goals: Understanding of nutrition guidelines, daily intake of sodium <1541m, cholesterol <2048m calories 30% from fat and 7% or less from saturated fats, daily to have 5 or more servings of fruits and vegetables.  Biometrics:  Pre Biometrics - 07/04/22 1515       Pre Biometrics   Grip Strength 22 kg              Nutrition Therapy Plan and Nutrition Goals:  Nutrition Therapy & Goals - 08/16/22 1505       Nutrition Therapy   Diet General Healthy Diet      Personal Nutrition Goals   Nutrition Goal Patient to identify strategies for weight loss of 0.5-2.0# per week of weight loss    Personal Goal #2 Patient to identify food sources and limit daily intake of saturated fat, sodium, refined carbohydrates, and trans fats.    Comments Reviewed goals today with Emiko and her mom. Discussed strategies for weight loss including high protein/high fiber snacks, the plate method as a guide for meal planning, and portion sizes.  Per documentation with [facility] RD on 08/06/2022, Aizlyn has stopped Ozempic to aid with  weight loss. Per her mom, they will discuss weight loss medication options with her PCP.  It is  recommended that she lose 30-50# to get to a BMI of 30-27 in prepartion for lung transplant. Kaina will continue to benefit from participation in pulmonary rehab for nutrition, exercise, and lifestyle modification.      Intervention Plan   Intervention Prescribe, educate and counsel regarding individualized specific dietary modifications aiming towards targeted core components such as weight, hypertension, lipid management, diabetes, heart failure and other comorbidities.;Nutrition handout(s) given to patient.    Expected Outcomes Short Term Goal: Understand basic principles of dietary content, such as calories, fat, sodium, cholesterol and nutrients.;Long Term Goal: Adherence to prescribed nutrition plan.             Nutrition Assessments:  MEDIFICTS Score Key: ?70 Need to make dietary changes  40-70 Heart Healthy Diet ? 40 Therapeutic Level Cholesterol Diet  Flowsheet Row Pulmonary Rehab from 11/13/2021 in [facility]  Picture Your Plate Total Score on Admission 69      Picture Your Plate Scores: [id number] Unhealthy dietary pattern with much room for improvement. 41-50 Dietary pattern unlikely to meet recommendations for good health and room for improvement. 51-60 More healthful dietary pattern, with some room for improvement.  >60 Healthy dietary pattern, although there may be some specific behaviors that could be improved.    Nutrition Goals Re-Evaluation:  Nutrition Goals Re-Evaluation     [place] Name 07/10/22 1552 08/09/22 1556 08/16/22 1505         Goals   Current Weight 212 lb 15.4 oz (96.6 kg) --  patient has not attended pulmonary rehab since 1/11 210 lb 15.7 oz (95.7 kg)     Comment A1c 5.7 LDL 115, A1c 5.9, albumin 2.5 LDL 115, A1c 5.9     Expected Outcome Khylie is motivated to lose weight in preparation for lung transplant. She recently saw [facility] and started Ozempic to aid with weight loss. She reports better control of hunger since  starting this medication. She reports drinking up to 2 protein drinks daily as snacks throughout the day. She reports trying to increase fruits and vegetable intake to aid with weight loss. She lives at home with her mom, Marcie Bal, her sister and niece (3). Her mom does the majority of the grocery shopping and cooking. Akiya will benefit in participation in pulmonary rehab for nutrition education, exercise, and lifestyle modification. Goals not met. Genice has missed multiple classes due to follow-up appointment with [facility] team and illness. Per documentation with [facility] RD on 08/06/2022, Cole has stopped Ozempic to aid with weight loss and declines other weight loss medication intevervention at this time. It is recommended that she lose 30-50# (BMI 30-27) in prepartion for lung transplant. Aylin will continue to benefit from participation in pulmonary rehab for nutrition, exercise, and lifestyle modification. Reviewed goals today with Jensyn and her mom. Discussed strategies for weight loss including high protein/high fiber snacks, the plate method as a guide for meal planning, and portion sizes. Per documentation with [facility] RD on 08/06/2022, Thelda has stopped Ozempic to aid with weight loss. Per her mom, they will discuss weight loss medication options with her PCP. It is recommended that she lose 30-50# to get to a BMI of 30-27 in prepartion for lung transplant. Marcella will continue to benefit from participation in pulmonary rehab for nutrition, exercise, and lifestyle modification.  Nutrition Goals Discharge (Final Nutrition Goals Re-Evaluation):  Nutrition Goals Re-Evaluation - 08/16/22 1505       Goals   Current Weight 210 lb 15.7 oz (95.7 kg)    Comment LDL 115, A1c 5.9    Expected Outcome Reviewed goals today with Lydiann and her mom. Discussed strategies for weight loss including high protein/high fiber snacks, the plate method as a guide for meal planning, and  portion sizes. Per documentation with [facility] RD on 08/06/2022, Dillon has stopped Ozempic to aid with weight loss. Per her mom, they will discuss weight loss medication options with her PCP. It is recommended that she lose 30-50# to get to a BMI of 30-27 in prepartion for lung transplant. Michella will continue to benefit from participation in pulmonary rehab for nutrition, exercise, and lifestyle modification.             Psychosocial: Target Goals: Acknowledge presence or absence of significant depression and/or stress, maximize coping skills, provide positive support system. Participant is able to verbalize types and ability to use techniques and skills needed for reducing stress and depression.  Initial Review & Psychosocial Screening:  Initial Psych Review & Screening - 07/04/22 1345       Initial Review   Current issues with None Identified      Family Dynamics   Good Support System? Yes    Comments mom, sister, aunts, uncles      Barriers   Psychosocial barriers to participate in program The patient should benefit from training in stress management and relaxation.      Screening Interventions   Interventions Encouraged to exercise    Expected Outcomes Short Term goal: Utilizing psychosocial counselor, staff and physician to assist with identification of specific Stressors or current issues interfering with healing process. Setting desired goal for each stressor or current issue identified.;Long Term Goal: Stressors or current issues are controlled or eliminated.;Long Term goal: The participant improves quality of Life and PHQ9 Scores as seen by post scores and/or verbalization of changes;Short Term goal: Identification and review with participant of any Quality of Life or Depression concerns found by scoring the questionnaire.             Quality of Life Scores:  Scores of 19 and below usually indicate a poorer quality of life in these areas.  A difference of  2-3 points is a  clinically meaningful difference.  A difference of 2-3 points in the total score of the Quality of Life Index has been associated with significant improvement in overall quality of life, self-image, physical symptoms, and general health in studies assessing change in quality of life.  PHQ-9: Review Flowsheet  More data exists      07/04/2022 11/13/2021 05/04/2021 06/16/2020 08/17/2019  Depression screen PHQ 2/9  Decreased Interest 1 1 0 1 0  Down, Depressed, Hopeless 1 1 1 $ 0 0  PHQ - 2 Score 2 2 1 1 $ 0  Altered sleeping 0 1 1 - -  Tired, decreased energy 1 1 2 $ - -  Change in appetite 1 1 3 $ - -  Feeling bad or failure about yourself  0 0 0 - -  Trouble concentrating 0 0 0 - -  Moving slowly or fidgety/restless 0 0 0 - -  Suicidal thoughts 0 0 0 - -  PHQ-9 Score 4 5 7 $ - -  Difficult doing work/chores Somewhat difficult Not difficult at all Somewhat difficult - -   Interpretation of Total Score  Total Score Depression Severity:  1-4 = Minimal depression, 5-9 = Mild depression, 10-14 = Moderate depression, 15-19 = Moderately severe depression, 20-27 = Severe depression   Psychosocial Evaluation and Intervention:  Psychosocial Evaluation - 07/04/22 1347       Psychosocial Evaluation & Interventions   Interventions Encouraged to exercise with the program and follow exercise prescription    Comments Arrianna feels supported and mentally stable given her health concerns. She is motivated to exercise and participate in class.    Expected Outcomes For pt to do PR.    Continue Psychosocial Services  No Follow up required             Psychosocial Re-Evaluation:  Psychosocial Re-Evaluation     [place] Name 07/25/22 1532 08/17/22 1521           Psychosocial Re-Evaluation   Current issues with Current Stress Concerns Current Stress Concerns      Comments Aslyn has completed 3 PR sessions so far. She is really working on improving her endurance and stamina so she can perform well on her  work up for a possible lung transplant. Blima has really been focusing on her 6-minute walk test that she needs to complete at [facility]. Clarann reports a weeklong appointments, tests, screenings, and procedures to see if she is a transplant candidate. This has been a source of stress and anxiety for her. She otherwise reports that her mental health is stable and has constant support and caregivers from her mom, aunts, uncles, and cousins. Yaret states that she will start seeing a therapist at [facility] from the transplant team. She declines any referrals at the moment. We will continue to follow, assess, and monitor Remedy for any needs that may arise. Jaice recently finished a week of testing, procedures, and lab work for a possible lung transplant. She was informed by the transplant team that she also needs a heart transplant. They plan to do the heart and lung transplant at the same time. To meet the requirements, she needs to raise $10k, lose weight, and build up her endurance and stamina. She is anxious about the surgery but states she's ready to "get it done". She currently wears 10L/100% O2 while exercising as her O2 sats are closely monitored. Lakeyta states that she has great support from her mother, sister, aunts and uncles. She is very concerned about getting an upper respiratory infection and keeps herself isolated. She denies any referrals at this time, but we will continue to follow and assess for any needs.      Expected Outcomes For Adiel to be able to participate in PR without any psychosocial barriers or concerns. For Davin to be able to participate in PR without any psychosocial barriers or concerns and to have a positive outlook with good coping skills      Interventions Stress management education;Encouraged to attend Pulmonary Rehabilitation for the exercise Stress management education;Encouraged to attend Pulmonary Rehabilitation for the exercise      Continue Psychosocial Services   Follow up required by staff Follow up required by staff        Initial Review   Source of Stress Concerns Chronic Illness  Initial stages of work up for possible lung transplant Chronic Illness  Initial stages of work up for possible lung transplant      Comments Disease progression and possible lung transplant Disease progression and possible heart and lung transplant               Psychosocial Discharge (Final Psychosocial Re-Evaluation):  Psychosocial Re-Evaluation - 08/17/22 1521       Psychosocial Re-Evaluation   Current issues with Current Stress Concerns    Comments Tamesha recently finished a week of testing, procedures, and lab work for a possible lung transplant. She was informed by the transplant team that she also needs a heart transplant. They plan to do the heart and lung transplant at the same time. To meet the requirements, she needs to raise $10k, lose weight, and build up her endurance and stamina. She is anxious about the surgery but states she's ready to "get it done". She currently wears 10L/100% O2 while exercising as her O2 sats are closely monitored. Deon states that she has great support from her mother, sister, aunts and uncles. She is very concerned about getting an upper respiratory infection and keeps herself isolated. She denies any referrals at this time, but we will continue to follow and assess for any needs.    Expected Outcomes For Gaylia to be able to participate in PR without any psychosocial barriers or concerns and to have a positive outlook with good coping skills    Interventions Stress management education;Encouraged to attend Pulmonary Rehabilitation for the exercise    Continue Psychosocial Services  Follow up required by staff      Initial Review   Source of Stress Concerns Chronic Illness   Initial stages of work up for possible lung transplant   Comments Disease progression and possible heart and lung transplant              Education: Education Goals: Education classes will be provided on a weekly basis, covering required topics. Participant will state understanding/return demonstration of topics presented.  Learning Barriers/Preferences:  Learning Barriers/Preferences - 07/04/22 1348       Learning Barriers/Preferences   Learning Barriers None    Learning Preferences Skilled Demonstration;Video;Individual Instruction;Audio             Education Topics: Introduction to Pulmonary Rehab Group instruction provided by PowerPoint, verbal discussion, and written material to support subject matter. Instructor reviews what Pulmonary Rehab is, the purpose of the program, and how patients are referred.     Know Your Numbers Group instruction that is supported by a PowerPoint presentation. Instructor discusses importance of knowing and understanding resting, exercise, and post-exercise oxygen saturation, heart rate, and blood pressure. Oxygen saturation, heart rate, blood pressure, rating of perceived exertion, and dyspnea are reviewed along with a normal range for these values.    Exercise for the Pulmonary Patient Group instruction that is supported by a PowerPoint presentation. Instructor discusses benefits of exercise, core components of exercise, frequency, duration, and intensity of an exercise routine, importance of utilizing pulse oximetry during exercise, safety while exercising, and options of places to exercise outside of rehab.    MET Level  Group instruction provided by PowerPoint, verbal discussion, and written material to support subject matter. Instructor reviews what METs are and how to increase METs.    Pulmonary Medications Verbally interactive group education provided by instructor with focus on inhaled medications and proper administration. Flowsheet Row PULMONARY REHAB OTHER RESPIRATORY from 07/19/2022 in [facility] [place]  Date  07/19/22  Educator EP  Instruction Review Code 1- Verbalizes Understanding       Anatomy and Physiology of the Respiratory System Group instruction provided by PowerPoint, verbal discussion, and written material to support subject matter. Instructor reviews respiratory cycle and anatomical components of the respiratory system and their  functions. Instructor also reviews differences in obstructive and restrictive respiratory diseases with examples of each.    Oxygen Safety Group instruction provided by PowerPoint, verbal discussion, and written material to support subject matter. There is an overview of "What is Oxygen" and "Why do we need it".  Instructor also reviews how to create a safe environment for oxygen use, the importance of using oxygen as prescribed, and the risks of noncompliance. There is a brief discussion on traveling with oxygen and resources the patient may utilize.   Oxygen Use Group instruction provided by PowerPoint, verbal discussion, and written material to discuss how supplemental oxygen is prescribed and different types of oxygen supply systems. Resources for more information are provided.    Breathing Techniques Group instruction that is supported by demonstration and informational handouts. Instructor discusses the benefits of pursed lip and diaphragmatic breathing and detailed demonstration on how to perform both.  Flowsheet Row PULMONARY REHAB OTHER RESPIRATORY from 08/16/2022 in [facility]  Date 08/16/22  Educator EP  Instruction Review Code 1- Verbalizes Understanding        Risk Factor Reduction Group instruction that is supported by a PowerPoint presentation. Instructor discusses the definition of a risk factor, different risk factors for pulmonary disease, and how the heart and lungs work together.   MD Day A group question and answer session with a medical doctor that allows participants to  ask questions that relate to their pulmonary disease state.   Nutrition for the Pulmonary Patient Group instruction provided by PowerPoint slides, verbal discussion, and written materials to support subject matter. The instructor gives an explanation and review of healthy diet recommendations, which includes a discussion on weight management, recommendations for fruit and vegetable consumption, as well as protein, fluid, caffeine, fiber, sodium, sugar, and alcohol. Tips for eating when patients are short of breath are discussed.    Other Education Group or individual verbal, written, or video instructions that support the educational goals of the pulmonary rehab program.    Knowledge Questionnaire Score:  Knowledge Questionnaire Score - 07/04/22 1348       Knowledge Questionnaire Score   Pre Score 17/18             Core Components/Risk Factors/Patient Goals at Admission:  Personal Goals and Risk Factors at Admission - 07/04/22 1355       Core Components/Risk Factors/Patient Goals on Admission    Weight Management Weight Loss    Improve shortness of breath with ADL's Yes    Intervention Provide education, individualized exercise plan and daily activity instruction to help decrease symptoms of SOB with activities of daily living.    Expected Outcomes Short Term: Improve cardiorespiratory fitness to achieve a reduction of symptoms when performing ADLs;Long Term: Be able to perform more ADLs without symptoms or delay the onset of symptoms    Personal Goal Other Yes    Personal Goal build muscle mass    Intervention resistance bands in PR and at home    Expected Outcomes increase grip strength             Core Components/Risk Factors/Patient Goals Review:   Goals and Risk Factor Review     Row Name 07/25/22 1541 08/17/22 1531           Core Components/Risk Factors/Patient Goals Review   Personal Goals Review Weight Management/Obesity;Improve shortness of breath with  ADL's;Develop more efficient breathing techniques such as purse lipped breathing and diaphragmatic breathing  and practicing self-pacing with activity. Weight Management/Obesity;Improve shortness of breath with ADL's;Develop more efficient breathing techniques such as purse lipped breathing and diaphragmatic breathing and practicing self-pacing with activity.;Stress      Review Ione has completed 3 PR sessions so far. She is really working on improving her endurance and stamina so she can perform well on her work up for a possible lung transplant. Currently, she is exercising on the recumbent bike and the recumbent elliptical, each for 56mn. She is deconditioned and requires 15L of oxygen to maintain her oxygen saturation. Taquila is also working to lose weight. There has been little change in weight, but she stated her MD started her on semaglutide medication and she is working with our dietitian. After 3 sessions, we cannot adequately calculate if Devany is making any exercise progress, but we will continue to monitor. She is practicing pursed lip breathing when he gets short of breath and can verbalize her rate of perceived exertion and dyspnea scale to staff. Shiya has attended 1 education class on anatomy and physiology of the lungs. Tearra states that she enjoys coming to class and working towards her goal of new lungs. Talyn has completed 4 PR sessions so far. Currently, she is exercising on the recumbent bike and the recumbent elliptical, each for 15 min. She is deconditioned and requires 15L/100% of oxygen to maintain her oxygen saturation. Freda is also working to lose weight, but we have seen little progress since she has started over a month ago. She is working with our dietitian. She is practicing pursed lip breathing when he gets short of breath and can verbalize her rate of perceived exertion and dyspnea scale to staff. Jaysha has attended education classes on breathing techniques and  anatomy and physiology of the lungs. Cassandr states that she enjoys coming to class and working towards her goal of new lungs and a heart.      Expected Outcomes See admission goals See admission goals               Core Components/Risk Factors/Patient Goals at Discharge (Final Review):   Goals and Risk Factor Review - 08/17/22 1531       Core Components/Risk Factors/Patient Goals Review   Personal Goals Review Weight Management/Obesity;Improve shortness of breath with ADL's;Develop more efficient breathing techniques such as purse lipped breathing and diaphragmatic breathing and practicing self-pacing with activity.;Stress    Review Doniqua has completed 4 PR sessions so far. Currently, she is exercising on the recumbent bike and the recumbent elliptical, each for 15 min. She is deconditioned and requires 15L/100% of oxygen to maintain her oxygen saturation. Yula is also working to lose weight, but we have seen little progress since she has started over a month ago. She is working with our dietitian. She is practicing pursed lip breathing when he gets short of breath and can verbalize her rate of perceived exertion and dyspnea scale to staff. Jamala has attended education classes on breathing techniques and anatomy and physiology of the lungs. Korey states that she enjoys coming to class and working towards her goal of new lungs and a heart.    Expected Outcomes See admission goals             ITP Comments:   Comments: Dr. JRodman Pickleis Medical Director [facility] Pul[facility] Re[facility]

## 2022-08-23 ENCOUNTER — Ambulatory Visit (HOSPITAL_COMMUNITY): Payer: Medicaid Other

## 2022-08-23 ENCOUNTER — Encounter (HOSPITAL_COMMUNITY): Payer: Medicaid Other

## 2022-08-23 ENCOUNTER — Telehealth (HOSPITAL_COMMUNITY): Payer: Self-pay

## 2022-08-23 NOTE — Telephone Encounter (Signed)
Patient called Pulm Rehab stating she is still having chest pain, 5/10. Rhonda Franco informed us that she has had chest pain in the past due to coughing but this was a "new" pain. Patient stated that last week her CP was "a lot worse" than it was now. Pt asked if she could come to [facility] Rehab class but RN informed her that she needed to call her MD and let them know about her CP. Informed Rhonda Franco that if she is cleared from MD's office then she could attend PR class this afternoon.

## 2022-08-28 ENCOUNTER — Ambulatory Visit (HOSPITAL_COMMUNITY): Payer: Medicaid Other

## 2022-08-28 ENCOUNTER — Encounter (HOSPITAL_COMMUNITY): Payer: Medicaid Other

## 2022-08-28 ENCOUNTER — Telehealth (HOSPITAL_COMMUNITY): Payer: Self-pay

## 2022-08-28 NOTE — Telephone Encounter (Signed)
Called pt to check in. Pt did not answer. Voicemail left.

## 2022-08-30 ENCOUNTER — Ambulatory Visit (HOSPITAL_COMMUNITY): Payer: Medicaid Other

## 2022-08-30 ENCOUNTER — Encounter (HOSPITAL_COMMUNITY): Payer: Medicaid Other

## 2022-08-30 ENCOUNTER — Telehealth (HOSPITAL_COMMUNITY): Payer: Self-pay

## 2022-08-30 NOTE — Telephone Encounter (Signed)
Called and checked on Rhonda Franco to see if she was going to make it for cardiac rehab. She stated that she is getting a cardiac workup for chest pain. She stated she is going to the MD tomorrow due to low oxygen tanks being low. We will continue to follow up with her about Pulmonary Rehab.

## 2022-08-31 ENCOUNTER — Ambulatory Visit (HOSPITAL_COMMUNITY): Payer: Medicaid Other

## 2022-09-04 ENCOUNTER — Encounter (HOSPITAL_COMMUNITY): Payer: Medicaid Other

## 2022-09-05 ENCOUNTER — Telehealth (HOSPITAL_COMMUNITY): Payer: Self-pay

## 2022-09-05 NOTE — Telephone Encounter (Addendum)
Called and LM for [name]'s mom, Rhonda Franco about Pulmonary Rehab and to see how [name] was doing. Awaiting call back    1110 Got in touch with Rhonda Franco. She informed us the [name] has been hospitalized due to her chest pain and increasing shortness of breath. We will continue to check on her.

## 2022-09-06 ENCOUNTER — Encounter (HOSPITAL_COMMUNITY): Payer: Medicaid Other

## 2022-09-06 ENCOUNTER — Telehealth (HOSPITAL_COMMUNITY): Payer: Self-pay | Admitting: *Deleted

## 2022-09-06 NOTE — Telephone Encounter (Signed)
Called to check on pt. She is now in the transplant window and will be staying at [facility] until surgery. Will d/c pt from PR. Discussed with her mother to get a referral for PR again after transplant.  Yves Dill BS, ACSM-CEP 09/06/2022 3:59 PM

## 2022-09-10 NOTE — Progress Notes (Signed)
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 ([place])  ILD (interstitial lung disease) ([place])  ADL UCSD:  Pulmonary Assessment Scores     Row Name 07/04/22 1351         ADL UCSD   ADL Phase Entry     SOB Score total 74       CAT Score   CAT Score 22       mMRC Score   mMRC Score 4              Initial Exercise Prescription:  Initial Exercise Prescription - 07/04/22 1500       Date of Initial Exercise RX and Referring Provider   Date 07/04/22    Referring Provider Maceo Pro    Expected Discharge Date 09/06/22      Oxygen   Oxygen Continuous    Liters 15    Maintain Oxygen Saturation 88% or higher      Recumbant Bike   Level 1    RPM 60    Minutes 15      Recumbant Elliptical   Level 1    RPM 60    Minutes 15      Prescription Details   Frequency (times per week) 2    Duration Progress to 30 minutes of continuous aerobic without signs/symptoms of physical distress      Intensity   THRR 40-80% of Max Heartrate 79-158    Ratings of Perceived Exertion 11-13    Perceived Dyspnea 0-4      Progression   Progression Continue progressive overload as per policy without signs/symptoms or physical distress.      Resistance Training   Training Prescription Yes    Weight red bands    Reps 10-15             Discharge Exercise Prescription (Final Exercise Prescription Changes):  Exercise Prescription  Changes - 08/21/22 1500       Response to Exercise   Blood Pressure (Admit) 98/62    Blood Pressure (Exit) 102/68    Heart Rate (Admit) 118 bpm    Heart Rate (Exercise) 144 bpm    Heart Rate (Exit) 131 bpm    Oxygen Saturation (Admit) 98 %    Oxygen Saturation (Exercise) 89 %    Oxygen Saturation (Exit) 91 %    Rating of Perceived Exertion (Exercise) 12    Perceived Dyspnea (Exercise) 2    Duration Progress to 30 minutes of  aerobic without signs/symptoms of physical distress    Intensity THRR unchanged      Progression   Progression Continue to progress workloads to maintain intensity without signs/symptoms of physical distress.      Resistance Training   Training Prescription Yes    Weight red bands    Reps 10-15    Time 10 Minutes      Interval Training   Interval Training No      Oxygen  Oxygen Continuous    Liters 15L NRB mask with exertion, 8L [place] at rest      Recumbant Bike   Level 1    RPM 60    Minutes 15    METs 1.3      Recumbant Elliptical   Level 1    Minutes 15    METs 1.3      Oxygen   Maintain Oxygen Saturation 88% or higher             Functional Capacity:  6 Minute Walk     Row Name 07/04/22 1509         6 Minute Walk   Phase Initial     Distance 420 feet     Walk Time 6 minutes  rest from 2:00-5:20     # of Rest Breaks 1     MPH 0.8     METS 3.86     RPE 15     Perceived Dyspnea  3     VO2 Peak 13.5     Symptoms Yes (comment)     Comments HR up to 169 and SpO2 down to 82 plus significant SOB/exertion therefore rested sitting for 3 min, 20 sec     Resting HR 140 bpm     Resting BP 128/76     Resting Oxygen Saturation  90 %     Exercise Oxygen Saturation  during 6 min walk 77 %  77 on 15L lowest noted     Max Ex. HR 169 bpm     Max Ex. BP 112/80     2 Minute Post BP 126/84       Interval HR   1 Minute HR 140     2 Minute HR 166     3 Minute HR 169     4 Minute HR 163     5 Minute HR 142     6 Minute HR 159     2  Minute Post HR 159     Interval Heart Rate? Yes       Interval Oxygen   Interval Oxygen? Yes     Baseline Oxygen Saturation % 90 %  90 6L     1 Minute Oxygen Saturation % 89 %     1 Minute Liters of Oxygen 15 L     2 Minute Oxygen Saturation % 82 %  began standing rest     2 Minute Liters of Oxygen 15 L     3 Minute Oxygen Saturation % 82 %  sitting rest     3 Minute Liters of Oxygen 15 L     4 Minute Oxygen Saturation % 89 %  continued sitting rest to increase spo2 more     4 Minute Liters of Oxygen 15 L     5 Minute Oxygen Saturation % 97 %  walked last 40 sec     5 Minute Liters of Oxygen 15 L     6 Minute Oxygen Saturation % 90 %     6 Minute Liters of Oxygen 15 L     2 Minute Post Oxygen Saturation % 83 %  up to 91 after 30 more seconds of rest     2 Minute Post Liters of Oxygen 15 L              Psychological, QOL, Others - Outcomes: PHQ 2/9:    07/04/2022    1:43 PM 11/13/2021   10:49 AM 05/04/2021  9:94 AM 06/16/2020    8:49 AM 08/17/2019   10:23 AM  Depression screen PHQ 2/9  Decreased Interest 1 1 0 1 0  Down, Depressed, Hopeless '1 1 1 '$ 0 0  PHQ - 2 Score '2 2 1 1 '$ 0  Altered sleeping 0 1 1    Tired, decreased energy '1 1 2    '$ Change in appetite '1 1 3    '$ Feeling bad or failure about yourself  0 0 0    Trouble concentrating 0 0 0    Moving slowly or fidgety/restless 0 0 0    Suicidal thoughts 0 0 0    PHQ-9 Score '4 5 7    '$ Difficult doing work/chores Somewhat difficult Not difficult at all Somewhat difficult      Quality of Life:   Personal Goals: Goals established at orientation with interventions provided to work toward goal.  Personal Goals and Risk Factors at Admission - 07/04/22 1355       Core Components/Risk Factors/Patient Goals on Admission    Weight Management Weight Loss    Improve shortness of breath with ADL's Yes    Intervention Provide education, individualized exercise plan and daily activity instruction to help decrease symptoms of SOB  with activities of daily living.    Expected Outcomes Short Term: Improve cardiorespiratory fitness to achieve a reduction of symptoms when performing ADLs;Long Term: Be able to perform more ADLs without symptoms or delay the onset of symptoms    Personal Goal Other Yes    Personal Goal build muscle mass    Intervention resistance bands in PR and at home    Expected Outcomes increase grip strength              Personal Goals Discharge:  Goals and Risk Factor Review     Row Name 07/25/22 1541 08/17/22 1531           Core Components/Risk Factors/Patient Goals Review   Personal Goals Review Weight Management/Obesity;Improve shortness of breath with ADL's;Develop more efficient breathing techniques such as purse lipped breathing and diaphragmatic breathing and practicing self-pacing with activity. Weight Management/Obesity;Improve shortness of breath with ADL's;Develop more efficient breathing techniques such as purse lipped breathing and diaphragmatic breathing and practicing self-pacing with activity.;Stress      Review Sharifa has completed 3 PR sessions so far. She is really working on improving her endurance and stamina so she can perform well on her work up for a possible lung transplant. Currently, she is exercising on the recumbent bike and the recumbent elliptical, each for 71mn. She is deconditioned and requires 15L of oxygen to maintain her oxygen saturation. Katiana is also working to lose weight. There has been little change in weight, but she stated her MD started her on semaglutide medication and she is working with our dietitian. After 3 sessions, we cannot adequately calculate if Itzae is making any exercise progress, but we will continue to monitor. She is practicing pursed lip breathing when he gets short of breath and can verbalize her rate of perceived exertion and dyspnea scale to staff. Sherrie has attended 1 education class on anatomy and physiology of the lungs. Mackenna  states that she enjoys coming to class and working towards her goal of new lungs. Aribella has completed 4 PR sessions so far. Currently, she is exercising on the recumbent bike and the recumbent elliptical, each for 15 min. She is deconditioned and requires 15L/100% of oxygen to maintain her oxygen saturation. Destini is also working to  lose weight, but we have seen little progress since she has started over a month ago. She is working with our dietitian. She is practicing pursed lip breathing when he gets short of breath and can verbalize her rate of perceived exertion and dyspnea scale to staff. Gaylon has attended education classes on breathing techniques and anatomy and physiology of the lungs. Dajanae states that she enjoys coming to class and working towards her goal of new lungs and a heart.      Expected Outcomes See admission goals See admission goals               Exercise Goals and Review:  Exercise Goals     Row Name 07/04/22 1353 07/24/22 0842 08/21/22 1645         Exercise Goals   Increase Physical Activity Yes Yes Yes     Intervention Provide advice, education, support and counseling about physical activity/exercise needs.;Develop an individualized exercise prescription for aerobic and resistive training based on initial evaluation findings, risk stratification, comorbidities and participant's personal goals. Provide advice, education, support and counseling about physical activity/exercise needs.;Develop an individualized exercise prescription for aerobic and resistive training based on initial evaluation findings, risk stratification, comorbidities and participant's personal goals. Provide advice, education, support and counseling about physical activity/exercise needs.;Develop an individualized exercise prescription for aerobic and resistive training based on initial evaluation findings, risk stratification, comorbidities and participant's personal goals.     Expected Outcomes  Short Term: Attend rehab on a regular basis to increase amount of physical activity.;Long Term: Exercising regularly at least 3-5 days a week.;Long Term: Add in home exercise to make exercise part of routine and to increase amount of physical activity. Short Term: Attend rehab on a regular basis to increase amount of physical activity.;Long Term: Exercising regularly at least 3-5 days a week.;Long Term: Add in home exercise to make exercise part of routine and to increase amount of physical activity. Short Term: Attend rehab on a regular basis to increase amount of physical activity.;Long Term: Exercising regularly at least 3-5 days a week.;Long Term: Add in home exercise to make exercise part of routine and to increase amount of physical activity.     Increase Strength and Stamina Yes Yes Yes     Intervention Provide advice, education, support and counseling about physical activity/exercise needs.;Develop an individualized exercise prescription for aerobic and resistive training based on initial evaluation findings, risk stratification, comorbidities and participant's personal goals. Provide advice, education, support and counseling about physical activity/exercise needs.;Develop an individualized exercise prescription for aerobic and resistive training based on initial evaluation findings, risk stratification, comorbidities and participant's personal goals. Provide advice, education, support and counseling about physical activity/exercise needs.;Develop an individualized exercise prescription for aerobic and resistive training based on initial evaluation findings, risk stratification, comorbidities and participant's personal goals.     Expected Outcomes Short Term: Increase workloads from initial exercise prescription for resistance, speed, and METs.;Short Term: Perform resistance training exercises routinely during rehab and add in resistance training at home;Long Term: Improve cardiorespiratory fitness,  muscular endurance and strength as measured by increased METs and functional capacity (6MWT) Short Term: Increase workloads from initial exercise prescription for resistance, speed, and METs.;Short Term: Perform resistance training exercises routinely during rehab and add in resistance training at home;Long Term: Improve cardiorespiratory fitness, muscular endurance and strength as measured by increased METs and functional capacity (6MWT) Short Term: Increase workloads from initial exercise prescription for resistance, speed, and METs.;Short Term: Perform resistance training exercises routinely during rehab  and add in resistance training at home;Long Term: Improve cardiorespiratory fitness, muscular endurance and strength as measured by increased METs and functional capacity (6MWT)     Able to understand and use rate of perceived exertion (RPE) scale Yes Yes Yes     Intervention Provide education and explanation on how to use RPE scale Provide education and explanation on how to use RPE scale Provide education and explanation on how to use RPE scale     Expected Outcomes Short Term: Able to use RPE daily in rehab to express subjective intensity level;Long Term:  Able to use RPE to guide intensity level when exercising independently Short Term: Able to use RPE daily in rehab to express subjective intensity level;Long Term:  Able to use RPE to guide intensity level when exercising independently Short Term: Able to use RPE daily in rehab to express subjective intensity level;Long Term:  Able to use RPE to guide intensity level when exercising independently     Able to understand and use Dyspnea scale Yes Yes Yes     Intervention Provide education and explanation on how to use Dyspnea scale Provide education and explanation on how to use Dyspnea scale Provide education and explanation on how to use Dyspnea scale     Expected Outcomes Short Term: Able to use Dyspnea scale daily in rehab to express subjective sense  of shortness of breath during exertion;Long Term: Able to use Dyspnea scale to guide intensity level when exercising independently Short Term: Able to use Dyspnea scale daily in rehab to express subjective sense of shortness of breath during exertion;Long Term: Able to use Dyspnea scale to guide intensity level when exercising independently Short Term: Able to use Dyspnea scale daily in rehab to express subjective sense of shortness of breath during exertion;Long Term: Able to use Dyspnea scale to guide intensity level when exercising independently     Knowledge and understanding of Target Heart Rate Range (THRR) Yes Yes Yes     Intervention Provide education and explanation of THRR including how the numbers were predicted and where they are located for reference Provide education and explanation of THRR including how the numbers were predicted and where they are located for reference Provide education and explanation of THRR including how the numbers were predicted and where they are located for reference     Expected Outcomes Short Term: Able to state/look up THRR;Long Term: Able to use THRR to govern intensity when exercising independently;Short Term: Able to use daily as guideline for intensity in rehab Short Term: Able to state/look up THRR;Long Term: Able to use THRR to govern intensity when exercising independently;Short Term: Able to use daily as guideline for intensity in rehab Short Term: Able to state/look up THRR;Long Term: Able to use THRR to govern intensity when exercising independently;Short Term: Able to use daily as guideline for intensity in rehab     Understanding of Exercise Prescription Yes Yes Yes     Intervention Provide education, explanation, and written materials on patient's individual exercise prescription Provide education, explanation, and written materials on patient's individual exercise prescription Provide education, explanation, and written materials on patient's individual  exercise prescription     Expected Outcomes Short Term: Able to explain program exercise prescription;Long Term: Able to explain home exercise prescription to exercise independently Short Term: Able to explain program exercise prescription;Long Term: Able to explain home exercise prescription to exercise independently Short Term: Able to explain program exercise prescription;Long Term: Able to explain home exercise prescription to exercise independently  Exercise Goals Re-Evaluation:  Exercise Goals Re-Evaluation     Row Name 07/24/22 [id number] 08/21/22 1645           Exercise Goal Re-Evaluation   Exercise Goals Review Increase Physical Activity;Able to understand and use Dyspnea scale;Understanding of Exercise Prescription;Increase Strength and Stamina;Knowledge and understanding of Target Heart Rate Range (THRR);Able to understand and use rate of perceived exertion (RPE) scale Increase Physical Activity;Able to understand and use Dyspnea scale;Understanding of Exercise Prescription;Increase Strength and Stamina;Knowledge and understanding of Target Heart Rate Range (THRR);Able to understand and use rate of perceived exertion (RPE) scale      Comments Pt has completed three exercise sessions. She missed one session for weather. She exercised for 15 min on the recumbent bike and recumbent elliptical. Markiesha is averaging 1.5 METs at level 1 on the recumbent elliptical and 1.4 METs at level 1 on the recumbent elliptical. Mikaela performed the warmup and cooldown seated/ standing dependent on her shortness of breath and hypoxia. She has not began progressing but enjoying exercise. Will begin to have pt ambulate soon to prepare for lung transplant Pt has completed four exercise sessions. She has missed many exercise sessions and was recently was out of PR for 1 month for illness then testing with [facility] transplant team. She attempted exercising for 15 min on the recumbent bike and recumbent  elliptical. This last session however struggled to maintain SpO2 and was significantly fatigued. Could only exercise 14 min total.  METs for both pieces of equipment were 1.3. She could not stand for warmup or cooldown. Will attempt to progress as able with a goal of increased ambulation.      Expected Outcomes Through exercise at rehab and home, the patient will decrease shortness of breath with daily activities and feel confident in carrying out an exercise regimen at home. Through exercise at rehab and home, the patient will decrease shortness of breath with daily activities and feel confident in carrying out an exercise regimen at home.               Nutrition & Weight - Outcomes:  Pre Biometrics - 07/04/22 1515       Pre Biometrics   Grip Strength 22 kg              Nutrition:  Nutrition Therapy & Goals - 08/16/22 1505       Nutrition Therapy   Diet General Healthy Diet      Personal Nutrition Goals   Nutrition Goal Patient to identify strategies for weight loss of 0.5-2.0# per week of weight loss    Personal Goal #2 Patient to identify food sources and limit daily intake of saturated fat, sodium, refined carbohydrates, and trans fats.    Comments Reviewed goals today with Sandhya and her mom. Discussed strategies for weight loss including high protein/high fiber snacks, the plate method as a guide for meal planning, and portion sizes.  Per documentation with [facility] RD on 08/06/2022, Chaka has stopped Ozempic to aid with weight loss. Per her mom, they will discuss weight loss medication options with her PCP.  It is recommended that she lose 30-50# to get to a BMI of 30-27 in prepartion for lung transplant. Lashante will continue to benefit from participation in pulmonary rehab for nutrition, exercise, and lifestyle modification.      Intervention Plan   Intervention Prescribe, educate and counsel regarding individualized specific dietary modifications aiming towards targeted  core components such as weight, hypertension, lipid management, diabetes, heart failure  and other comorbidities.;Nutrition handout(s) given to patient.    Expected Outcomes Short Term Goal: Understand basic principles of dietary content, such as calories, fat, sodium, cholesterol and nutrients.;Long Term Goal: Adherence to prescribed nutrition plan.             Nutrition Discharge:   Education Questionnaire Score:  Knowledge Questionnaire Score - 07/04/22 1348       Knowledge Questionnaire Score   Pre Score 17/18             Goals reviewed with patient; copy given to patient.

## 2022-09-10 NOTE — Addendum Note (Signed)
Encounter addended by: Janine Ores, RN on: 09/10/2022 1:32 PM  Actions taken: Clinical Note Signed

## 2022-09-11 ENCOUNTER — Ambulatory Visit (HOSPITAL_COMMUNITY): Payer: Medicaid Other

## 2022-09-13 ENCOUNTER — Ambulatory Visit (HOSPITAL_COMMUNITY): Payer: Medicaid Other

## 2022-09-18 ENCOUNTER — Ambulatory Visit (HOSPITAL_COMMUNITY): Payer: Medicaid Other

## 2022-09-20 ENCOUNTER — Ambulatory Visit (HOSPITAL_COMMUNITY): Payer: Medicaid Other

## 2022-09-25 ENCOUNTER — Ambulatory Visit (HOSPITAL_COMMUNITY): Payer: Medicaid Other

## 2022-09-27 ENCOUNTER — Ambulatory Visit (HOSPITAL_COMMUNITY): Payer: Medicaid Other

## 2022-10-02 ENCOUNTER — Ambulatory Visit (HOSPITAL_COMMUNITY): Payer: Medicaid Other

## 2022-10-04 ENCOUNTER — Ambulatory Visit (HOSPITAL_COMMUNITY): Payer: Medicaid Other

## 2022-10-09 ENCOUNTER — Ambulatory Visit (HOSPITAL_COMMUNITY): Payer: Medicaid Other

## 2022-10-11 ENCOUNTER — Ambulatory Visit (HOSPITAL_COMMUNITY): Payer: Medicaid Other

## 2022-10-16 ENCOUNTER — Ambulatory Visit (HOSPITAL_COMMUNITY): Payer: Medicaid Other

## 2023-12-29 ENCOUNTER — Other Ambulatory Visit: Payer: Self-pay

## 2023-12-29 ENCOUNTER — Encounter (HOSPITAL_COMMUNITY): Payer: Self-pay | Admitting: *Deleted

## 2023-12-29 ENCOUNTER — Ambulatory Visit (HOSPITAL_COMMUNITY)
Admission: EM | Admit: 2023-12-29 | Discharge: 2023-12-29 | Disposition: A | Discharge status: Home / Self Care | Attending: Emergency Medicine | Admitting: Emergency Medicine

## 2023-12-29 DIAGNOSIS — B3731 Acute candidiasis of vulva and vagina: Secondary | ICD-10-CM | POA: Diagnosis not present

## 2023-12-29 DIAGNOSIS — N898 Other specified noninflammatory disorders of vagina: Secondary | ICD-10-CM | POA: Diagnosis not present

## 2023-12-29 DIAGNOSIS — Z942 Lung transplant status: Secondary | ICD-10-CM | POA: Diagnosis not present

## 2023-12-29 MED ORDER — FLUCONAZOLE 150 MG PO TABS: 150.0000 mg | ORAL_TABLET | ORAL | 0 refills | Status: AC

## 2023-12-29 NOTE — Discharge Instructions (Signed)
 You will get a call if tests are positive or abnormal, you will not get a call if tests are negative  or normal but you can check results in MyChart if you have a MyChart account.   I recommend you use Monistat (generic miconazole vaginal cream ok to use) in addition to the fluconazole .   Let your treatment team know you are taking fluconazole .

## 2023-12-29 NOTE — ED Triage Notes (Signed)
 PT reports having a vag yeast infection with discharge.

## 2023-12-29 NOTE — ED Provider Notes (Signed)
 [facility]    CSN: [id number] Arrival date & time: 12/29/23  1730      History   Chief Complaint Chief Complaint  Patient presents with   Vaginal Discharge    HPI Sawsan Laflamme is a 23 y.o. female. 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 Was recently in hospital for lung infeciton, treated with antibiotics. Now has thick, white, clumpy vaginal discharge and has vulvovaginal itching. Believes she has a yeast infection. Also treated earlier this month for BV with metrogel , thinks this treatment may also have caused yeast infection. Denies abd pain, flank pain, dysuria, fever, or chills.    Vaginal Discharge   Past Medical History:  Diagnosis Date   Anemia    Class 2 obesity 03/14/2022   Lupus    Prediabetes    Pulmonary hypertension (HCC) 10/21/2021   Formatting of this note might be different from the original. 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  PH Data: - PFT 10/19/21: FVC 31% pred, FEV1 33% pred, FEV1/FVC 94%, DLCO 34% pred. Consistent with severe restriction, no obstruction and severe decrease in DLCO. Normal DLCO/FVC ratio. - TTE 11/23/20: Normal LV size with mildly reduced function (LVEF 50%). Normal   Sleep apnea 08/25/2008   Formatting of this note might be different from the original. 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   Systemic lupus  erythematosus with lung involvement (HCC) 12/16/2020   Atypical chest pain 09/15/2020   COVID-19 vaccine series completed 06/16/2020   Elevated blood pressure reading without diagnosis of hypertension 06/16/2020   Class 3 severe obesity due to excess calories without serious comorbidity with body mass index (BMI) of 40.0 to 44.9 in adult 08/17/2019   ILD (interstitial lung disease) (HCC) 05/18/2019   Myositis 02/10/2019   BMI 40.0-44.9, adult (HCC) 06/18/2018   Vaginal discharge 06/18/2018   High risk heterosexual behavior 06/18/2018   High risk social situation 04/27/2017   Systemic lupus erythematosus, unspecified (HCC) 03/17/2017   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 No pertinent surgical history.  OB History     Gravida  0   Para  0   Term  0   Preterm  0   AB  0   Living  0      SAB  0   IAB  0   Ectopic  0   Multiple  0   Live Births  0            Home Medications    Prior to Admission medications   Medication Sig Start Date End Date Taking? Authorizing Provider  aspirin  EC 81 MG tablet Take 81 mg by mouth daily.   Yes  [provider]  fluconazole  (DIFLUCAN ) 150 MG tablet Take 1 tablet (150 mg total) by mouth every 3 (three) days. 12/29/23  Yes Richad Jon HERO, NP  predniSONE  (DELTASONE ) 20 MG tablet Take 2 tabs until 05/18/22 then 1 tab daily x 7 days Patient taking differently: Take 7.5 mg by mouth daily with breakfast. 7.5 mg taper 05/12/22  Yes Kc, Mennie, MD  acetaZOLAMIDE  (DIAMOX ) 250 MG tablet Take 250 mg by mouth 2 (two) times daily.    [provider]  atovaquone  (MEPRON ) 750 MG/5ML suspension Take 10 mLs by mouth in the morning. 05/02/21   [provider]   ergocalciferol  (VITAMIN D2) 1.25 MG (50000 UT) capsule Take 50,000 Units by mouth once a week.    [provider]  famotidine  (PEPCID ) 20 MG tablet Take 20 mg by mouth 2 (two) times daily. 05/18/21   [provider]  hydroxychloroquine  (PLAQUENIL ) 200 MG tablet Take 1 tablet (200 mg total) by mouth 2 (two) times daily. 01/18/18   Odell Balls, PA-C  Iron, Ferrous Sulfate , 325 (65 Fe) MG TABS 1 tablet Orally daily    [provider]  OZEMPIC, 0.25 OR 0.5 MG/DOSE, 2 MG/3ML SOPN 0.25 mg as directed Subcutaneous once weekly for 30 days 06/26/22   [provider]  Potassium Bicarb-Citric Acid 20 MEQ TBEF Take 20 mEq by mouth daily.    [provider]  potassium chloride  SA (KLOR-CON  M) 20 MEQ tablet Take 20 mEq by mouth 3 (three) times daily. 03/01/22   [provider]  torsemide  (DEMADEX ) 20 MG tablet Take 20 mg by mouth 2 (two) times daily.    [provider]  Treprostinil  (TYVASO  IN) Inhale 8 puffs into the lungs 4 (four) times daily.    [provider]    Family History Family History  Problem Relation Age of Onset   Diabetes Mother    Arthritis Mother    Diabetes Maternal Aunt    Diabetes Maternal Grandfather    Heart disease Maternal Grandfather    Stroke Paternal Grandmother     Social History Social History   Tobacco Use   Smoking status: Never   Smokeless tobacco: Never  Vaping Use   Vaping status: Never Used  Substance Use Topics   Alcohol use: No   Drug use: No     Allergies   Ampicillin, Keflex  [cephalexin ], Penicillins, Bactrim [sulfamethoxazole-trimethoprim], and Levaquin  [levofloxacin ]   Review of Systems Review of Systems  Genitourinary:  Positive for vaginal discharge.     Physical Exam Triage Vital Signs ED Triage Vitals  Encounter Vitals Group     BP 12/29/23 1803 115/75     Girls Systolic BP Percentile --      Girls Diastolic BP Percentile --      Boys Systolic BP Percentile  --      Boys Diastolic BP Percentile --      Pulse Rate 12/29/23 1803 92     Resp 12/29/23 1803 18     Temp 12/29/23 1803 98 F (36.7 C)     Temp src --      SpO2 12/29/23 1803 98 %     Weight --      Height --      Head Circumference --      Peak Flow --      Pain Score 12/29/23 1756 0     Pain Loc --      Pain Education --      Exclude from Growth Chart --  No data found.  Updated Vital Signs BP 115/75   Pulse 92   Temp 98 F (36.7 C)   Resp 18   LMP 12/08/2023   SpO2 98%   Visual Acuity Right Eye Distance:   Left Eye Distance:   Bilateral Distance:    Right Eye Near:   Left Eye Near:    Bilateral Near:     Physical Exam Constitutional:      Appearance: Normal appearance.  Pulmonary:     Effort: Pulmonary effort is normal.   Neurological:     Mental Status: She is alert and oriented to person, place, and time.      UC Treatments / Results  Labs (all labs ordered are listed, but only abnormal results are displayed) Labs Reviewed  CERVICOVAGINAL ANCILLARY ONLY    EKG   Radiology No results found.  Procedures Procedures (including critical care time)  Medications Ordered in UC Medications - No data to display  Initial Impression / Assessment and Plan / UC Course  I have reviewed the triage vital signs and the nursing notes.  Pertinent labs & imaging results that were available during my care of the patient were reviewed by me and considered in my medical decision making (see chart for details).    Will go ahead and treat for yeast based on sx report.   Final Clinical Impressions(s) / UC Diagnoses   Final diagnoses:  Yeast vaginitis     Discharge Instructions      You will get a call if tests are positive or abnormal, you will not get a call if tests are negative  or normal but you can check results in MyChart if you have a MyChart account.   I recommend you use Monistat (generic miconazole vaginal cream ok to use) in addition to  the fluconazole .   Let your treatment team know you are taking fluconazole .    ED Prescriptions     Medication Sig Dispense Auth. Provider   fluconazole  (DIFLUCAN ) 150 MG tablet Take 1 tablet (150 mg total) by mouth every 3 (three) days. 2 tablet Richad Jon HERO, NP      PDMP not reviewed this encounter.   Richad Jon HERO, NP 12/29/23 RONOLD

## 2023-12-31 LAB — CERVICOVAGINAL ANCILLARY ONLY
Bacterial Vaginitis (gardnerella): POSITIVE — AB
Candida Glabrata: NEGATIVE
Candida Vaginitis: POSITIVE — AB
Chlamydia: NEGATIVE
Comment: NEGATIVE
Comment: NEGATIVE
Comment: NEGATIVE
Comment: NEGATIVE
Comment: NEGATIVE
Comment: NORMAL
Neisseria Gonorrhea: NEGATIVE
Trichomonas: NEGATIVE

## 2024-01-01 ENCOUNTER — Ambulatory Visit (HOSPITAL_COMMUNITY): Payer: Self-pay

## 2024-01-01 MED ORDER — CLINDAMYCIN HCL 150 MG PO CAPS: 300.0000 mg | ORAL_CAPSULE | Freq: Two times a day (BID) | ORAL | 0 refills | Status: AC

## 2024-04-02 ENCOUNTER — Ambulatory Visit: Payer: Medicaid Other | Admitting: Dermatology
